# Patient Record
Sex: Female | Born: 1970 | Race: Black or African American | Hispanic: No | Marital: Single | Smoking: Never smoker
Health system: Southern US, Community
[De-identification: ages and names within clinical notes are randomized; demographics above are authoritative.]

## PROBLEM LIST (undated history)

## (undated) DIAGNOSIS — F32A Depression, unspecified: Secondary | ICD-10-CM

## (undated) DIAGNOSIS — F419 Anxiety disorder, unspecified: Secondary | ICD-10-CM

## (undated) DIAGNOSIS — I10 Essential (primary) hypertension: Secondary | ICD-10-CM

## (undated) DIAGNOSIS — E210 Primary hyperparathyroidism: Secondary | ICD-10-CM

## (undated) DIAGNOSIS — G43909 Migraine, unspecified, not intractable, without status migrainosus: Secondary | ICD-10-CM

## (undated) DIAGNOSIS — G4730 Sleep apnea, unspecified: Secondary | ICD-10-CM

## (undated) DIAGNOSIS — R12 Heartburn: Secondary | ICD-10-CM

## (undated) DIAGNOSIS — K219 Gastro-esophageal reflux disease without esophagitis: Secondary | ICD-10-CM

## (undated) DIAGNOSIS — R7303 Prediabetes: Secondary | ICD-10-CM

## (undated) DIAGNOSIS — M1990 Unspecified osteoarthritis, unspecified site: Secondary | ICD-10-CM

## (undated) DIAGNOSIS — Z87442 Personal history of urinary calculi: Secondary | ICD-10-CM

## (undated) DIAGNOSIS — E785 Hyperlipidemia, unspecified: Secondary | ICD-10-CM

## (undated) DIAGNOSIS — J302 Other seasonal allergic rhinitis: Secondary | ICD-10-CM

## (undated) DIAGNOSIS — K259 Gastric ulcer, unspecified as acute or chronic, without hemorrhage or perforation: Secondary | ICD-10-CM

## (undated) DIAGNOSIS — F329 Major depressive disorder, single episode, unspecified: Secondary | ICD-10-CM

## (undated) DIAGNOSIS — E119 Type 2 diabetes mellitus without complications: Secondary | ICD-10-CM

## (undated) DIAGNOSIS — N200 Calculus of kidney: Secondary | ICD-10-CM

## (undated) DIAGNOSIS — R112 Nausea with vomiting, unspecified: Secondary | ICD-10-CM

## (undated) DIAGNOSIS — E039 Hypothyroidism, unspecified: Secondary | ICD-10-CM

## (undated) DIAGNOSIS — J45909 Unspecified asthma, uncomplicated: Secondary | ICD-10-CM

## (undated) DIAGNOSIS — Z9889 Other specified postprocedural states: Secondary | ICD-10-CM

## (undated) HISTORY — PX: KNEE ARTHROSCOPY: SUR90

## (undated) HISTORY — PX: JOINT REPLACEMENT: SHX530

## (undated) HISTORY — DX: Gastric ulcer, unspecified as acute or chronic, without hemorrhage or perforation: K25.9

## (undated) HISTORY — PX: VAGINAL HYSTERECTOMY: SUR661

## (undated) HISTORY — DX: Hyperlipidemia, unspecified: E78.5

## (undated) HISTORY — PX: TUBAL LIGATION: SHX77

## (undated) HISTORY — PX: THYROID SURGERY: SHX805

## (undated) HISTORY — PX: BREAST BIOPSY: SHX20

## (undated) HISTORY — DX: Heartburn: R12

---

## 1997-10-31 ENCOUNTER — Other Ambulatory Visit: Admission: RE | Admit: 1997-10-31 | Discharge: 1997-10-31 | Payer: Self-pay | Admitting: Obstetrics and Gynecology

## 1998-03-09 ENCOUNTER — Inpatient Hospital Stay (HOSPITAL_COMMUNITY): Admission: AD | Admit: 1998-03-09 | Discharge: 1998-03-09 | Payer: Self-pay | Admitting: Obstetrics and Gynecology

## 1998-04-27 ENCOUNTER — Inpatient Hospital Stay (HOSPITAL_COMMUNITY): Admission: AD | Admit: 1998-04-27 | Discharge: 1998-04-27 | Payer: Self-pay | Admitting: Obstetrics and Gynecology

## 1998-05-26 ENCOUNTER — Inpatient Hospital Stay (HOSPITAL_COMMUNITY): Admission: AD | Admit: 1998-05-26 | Discharge: 1998-05-31 | Payer: Self-pay | Admitting: Obstetrics and Gynecology

## 2004-01-19 ENCOUNTER — Other Ambulatory Visit: Payer: Self-pay

## 2004-08-14 ENCOUNTER — Emergency Department: Payer: Self-pay | Admitting: Emergency Medicine

## 2005-02-18 ENCOUNTER — Emergency Department: Payer: Self-pay | Admitting: General Practice

## 2005-05-15 ENCOUNTER — Other Ambulatory Visit: Payer: Self-pay

## 2005-05-22 ENCOUNTER — Ambulatory Visit: Payer: Self-pay | Admitting: Unknown Physician Specialty

## 2005-06-05 ENCOUNTER — Emergency Department: Payer: Self-pay | Admitting: Emergency Medicine

## 2006-03-04 DIAGNOSIS — J309 Allergic rhinitis, unspecified: Secondary | ICD-10-CM | POA: Insufficient documentation

## 2006-04-08 ENCOUNTER — Emergency Department: Payer: Self-pay | Admitting: Emergency Medicine

## 2006-04-12 ENCOUNTER — Emergency Department: Payer: Self-pay | Admitting: Emergency Medicine

## 2007-06-07 ENCOUNTER — Emergency Department: Payer: Self-pay | Admitting: Emergency Medicine

## 2007-09-14 DIAGNOSIS — E039 Hypothyroidism, unspecified: Secondary | ICD-10-CM | POA: Insufficient documentation

## 2007-10-20 ENCOUNTER — Emergency Department: Payer: Self-pay | Admitting: Emergency Medicine

## 2008-03-02 ENCOUNTER — Emergency Department: Payer: Self-pay | Admitting: Emergency Medicine

## 2008-12-12 ENCOUNTER — Emergency Department: Payer: Self-pay | Admitting: Emergency Medicine

## 2009-10-01 ENCOUNTER — Emergency Department: Payer: Self-pay | Admitting: Emergency Medicine

## 2011-03-06 ENCOUNTER — Ambulatory Visit: Payer: Self-pay | Admitting: Internal Medicine

## 2011-03-27 ENCOUNTER — Ambulatory Visit: Payer: Self-pay | Admitting: Emergency Medicine

## 2011-03-29 LAB — PATHOLOGY REPORT

## 2012-07-14 ENCOUNTER — Ambulatory Visit: Payer: Self-pay | Admitting: Internal Medicine

## 2012-07-15 ENCOUNTER — Ambulatory Visit: Payer: Self-pay | Admitting: Internal Medicine

## 2012-09-22 ENCOUNTER — Ambulatory Visit: Payer: Self-pay

## 2013-06-08 ENCOUNTER — Encounter: Payer: Self-pay | Admitting: *Deleted

## 2013-06-15 ENCOUNTER — Ambulatory Visit: Payer: Self-pay | Admitting: General Surgery

## 2013-07-01 ENCOUNTER — Encounter: Payer: Self-pay | Admitting: *Deleted

## 2013-09-27 DIAGNOSIS — M1710 Unilateral primary osteoarthritis, unspecified knee: Secondary | ICD-10-CM | POA: Insufficient documentation

## 2013-09-27 DIAGNOSIS — M1712 Unilateral primary osteoarthritis, left knee: Secondary | ICD-10-CM | POA: Insufficient documentation

## 2013-09-27 DIAGNOSIS — M179 Osteoarthritis of knee, unspecified: Secondary | ICD-10-CM | POA: Insufficient documentation

## 2013-09-27 DIAGNOSIS — M25869 Other specified joint disorders, unspecified knee: Secondary | ICD-10-CM | POA: Insufficient documentation

## 2013-10-05 ENCOUNTER — Ambulatory Visit: Payer: Self-pay | Admitting: Internal Medicine

## 2013-10-14 ENCOUNTER — Ambulatory Visit: Payer: Self-pay | Admitting: General Surgery

## 2013-11-01 ENCOUNTER — Ambulatory Visit: Payer: Self-pay | Admitting: General Surgery

## 2013-11-04 ENCOUNTER — Encounter: Payer: Self-pay | Admitting: *Deleted

## 2014-01-05 ENCOUNTER — Encounter: Payer: Self-pay | Admitting: Orthopedic Surgery

## 2014-06-20 ENCOUNTER — Ambulatory Visit: Payer: Self-pay | Admitting: Internal Medicine

## 2014-12-15 ENCOUNTER — Encounter: Payer: Self-pay | Admitting: Emergency Medicine

## 2014-12-15 ENCOUNTER — Emergency Department
Admission: EM | Admit: 2014-12-15 | Discharge: 2014-12-15 | Disposition: A | Discharge status: Home / Self Care | Payer: Medicaid Other | Attending: Emergency Medicine | Admitting: Emergency Medicine

## 2014-12-15 DIAGNOSIS — R1110 Vomiting, unspecified: Secondary | ICD-10-CM | POA: Diagnosis not present

## 2014-12-15 DIAGNOSIS — R1084 Generalized abdominal pain: Secondary | ICD-10-CM | POA: Insufficient documentation

## 2014-12-15 LAB — URINALYSIS COMPLETE WITH MICROSCOPIC (ARMC ONLY)
Bacteria, UA: NONE SEEN
Bilirubin Urine: NEGATIVE
GLUCOSE, UA: NEGATIVE mg/dL
KETONES UR: NEGATIVE mg/dL
LEUKOCYTES UA: NEGATIVE
NITRITE: NEGATIVE
PH: 7 (ref 5.0–8.0)
Protein, ur: NEGATIVE mg/dL
RBC / HPF: NONE SEEN RBC/hpf (ref 0–5)
Specific Gravity, Urine: 1.001 — ABNORMAL LOW (ref 1.005–1.030)

## 2014-12-15 LAB — COMPREHENSIVE METABOLIC PANEL
ALT: 10 U/L — AB (ref 14–54)
AST: 21 U/L (ref 15–41)
Albumin: 3.7 g/dL (ref 3.5–5.0)
Alkaline Phosphatase: 55 U/L (ref 38–126)
Anion gap: 9 (ref 5–15)
BUN: 9 mg/dL (ref 6–20)
CHLORIDE: 103 mmol/L (ref 101–111)
CO2: 25 mmol/L (ref 22–32)
Calcium: 8.5 mg/dL — ABNORMAL LOW (ref 8.9–10.3)
Creatinine, Ser: 0.65 mg/dL (ref 0.44–1.00)
GFR calc Af Amer: >60 mL/min (ref >60)
GFR calc non Af Amer: >60 mL/min (ref >60)
Glucose, Bld: 107 mg/dL — ABNORMAL HIGH (ref 65–99)
Potassium: 3.8 mmol/L (ref 3.5–5.1)
SODIUM: 137 mmol/L (ref 135–145)
Total Bilirubin: 0.9 mg/dL (ref 0.3–1.2)
Total Protein: 7.8 g/dL (ref 6.5–8.1)

## 2014-12-15 LAB — CBC
HEMATOCRIT: 36.9 % (ref 35.0–47.0)
Hemoglobin: 12.2 g/dL (ref 12.0–16.0)
MCH: 28.7 pg (ref 26.0–34.0)
MCHC: 33 g/dL (ref 32.0–36.0)
MCV: 86.9 fL (ref 80.0–100.0)
Platelets: 299 10*3/uL (ref 150–440)
RBC: 4.25 MIL/uL (ref 3.80–5.20)
RDW: 14.4 % (ref 11.5–14.5)
WBC: 8.3 10*3/uL (ref 3.6–11.0)

## 2014-12-15 LAB — LIPASE, BLOOD: Lipase: 17 U/L — ABNORMAL LOW (ref 22–51)

## 2014-12-15 MED ORDER — GI COCKTAIL ~~LOC~~
30.0000 mL | ORAL | Status: AC
  Administered 2014-12-15: 30 mL via ORAL
  Filled 2014-12-15: qty 30

## 2014-12-15 MED ORDER — OXYCODONE-ACETAMINOPHEN 5-325 MG PO TABS: 0.5000 | ORAL_TABLET | Freq: Four times a day (QID) | ORAL | Status: DC | PRN

## 2014-12-15 MED ORDER — RANITIDINE HCL 150 MG PO CAPS: 150.0000 mg | ORAL_CAPSULE | Freq: Two times a day (BID) | ORAL | Status: DC

## 2014-12-15 MED ORDER — FAMOTIDINE 20 MG PO TABS
40.0000 mg | ORAL_TABLET | Freq: Once | ORAL | Status: AC
  Administered 2014-12-15: 40 mg via ORAL
  Filled 2014-12-15: qty 2

## 2014-12-15 MED ORDER — ONDANSETRON 8 MG PO TBDP: 8.0000 mg | ORAL_TABLET | Freq: Three times a day (TID) | ORAL | Status: DC | PRN

## 2014-12-15 NOTE — Discharge Instructions (Signed)
Abdominal Pain, Women °Abdominal (stomach, pelvic, or belly) pain can be caused by many things. It is important to tell your doctor: °· The location of the pain. °· Does it come and go or is it present all the time? °· Are there things that start the pain (eating certain foods, exercise)? °· Are there other symptoms associated with the pain (fever, nausea, vomiting, diarrhea)? °All of this is helpful to know when trying to find the cause of the pain. °CAUSES  °· Stomach: virus or bacteria infection, or ulcer. °· Intestine: appendicitis (inflamed appendix), regional ileitis (Crohn's disease), ulcerative colitis (inflamed colon), irritable bowel syndrome, diverticulitis (inflamed diverticulum of the colon), or cancer of the stomach or intestine. °· Gallbladder disease or stones in the gallbladder. °· Kidney disease, kidney stones, or infection. °· Pancreas infection or cancer. °· Fibromyalgia (pain disorder). °· Diseases of the female organs: °¨ Uterus: fibroid (non-cancerous) tumors or infection. °¨ Fallopian tubes: infection or tubal pregnancy. °¨ Ovary: cysts or tumors. °¨ Pelvic adhesions (scar tissue). °¨ Endometriosis (uterus lining tissue growing in the pelvis and on the pelvic organs). °¨ Pelvic congestion syndrome (female organs filling up with blood just before the menstrual period). °¨ Pain with the menstrual period. °¨ Pain with ovulation (producing an egg). °¨ Pain with an IUD (intrauterine device, birth control) in the uterus. °¨ Cancer of the female organs. °· Functional pain (pain not caused by a disease, may improve without treatment). °· Psychological pain. °· Depression. °DIAGNOSIS  °Your doctor will decide the seriousness of your pain by doing an examination. °· Blood tests. °· X-rays. °· Ultrasound. °· CT scan (computed tomography, special type of X-ray). °· MRI (magnetic resonance imaging). °· Cultures, for infection. °· Barium enema (dye inserted in the large intestine, to better view it with  X-rays). °· Colonoscopy (looking in intestine with a lighted tube). °· Laparoscopy (minor surgery, looking in abdomen with a lighted tube). °· Major abdominal exploratory surgery (looking in abdomen with a large incision). °TREATMENT  °The treatment will depend on the cause of the pain.  °· Many cases can be observed and treated at home. °· Over-the-counter medicines recommended by your caregiver. °· Prescription medicine. °· Antibiotics, for infection. °· Birth control pills, for painful periods or for ovulation pain. °· Hormone treatment, for endometriosis. °· Nerve blocking injections. °· Physical therapy. °· Antidepressants. °· Counseling with a psychologist or psychiatrist. °· Minor or major surgery. °HOME CARE INSTRUCTIONS  °· Do not take laxatives, unless directed by your caregiver. °· Take over-the-counter pain medicine only if ordered by your caregiver. Do not take aspirin because it can cause an upset stomach or bleeding. °· Try a clear liquid diet (broth or water) as ordered by your caregiver. Slowly move to a bland diet, as tolerated, if the pain is related to the stomach or intestine. °· Have a thermometer and take your temperature several times a day, and record it. °· Bed rest and sleep, if it helps the pain. °· Avoid sexual intercourse, if it causes pain. °· Avoid stressful situations. °· Keep your follow-up appointments and tests, as your caregiver orders. °· If the pain does not go away with medicine or surgery, you may try: °¨ Acupuncture. °¨ Relaxation exercises (yoga, meditation). °¨ Group therapy. °¨ Counseling. °SEEK MEDICAL CARE IF:  °· You notice certain foods cause stomach pain. °· Your home care treatment is not helping your pain. °· You need stronger pain medicine. °· You want your IUD removed. °· You feel faint or   lightheaded. °· You develop nausea and vomiting. °· You develop a rash. °· You are having side effects or an allergy to your medicine. °SEEK IMMEDIATE MEDICAL CARE IF:  °· Your  pain does not go away or gets worse. °· You have a fever. °· Your pain is felt only in portions of the abdomen. The right side could possibly be appendicitis. The left lower portion of the abdomen could be colitis or diverticulitis. °· You are passing blood in your stools (bright red or black tarry stools, with or without vomiting). °· You have blood in your urine. °· You develop chills, with or without a fever. °· You pass out. °MAKE SURE YOU:  °· Understand these instructions. °· Will watch your condition. °· Will get help right away if you are not doing well or get worse. °Document Released: 03/03/2007 Document Revised: 09/20/2013 Document Reviewed: 03/23/2009 °[employer]® Patient Information ©2015 [employer]. This information is not intended to replace advice given to you by your health care provider. Make sure you discuss any questions you have with your health care provider. ° °

## 2014-12-15 NOTE — ED Notes (Signed)
Patient ambulatory to triage with steady gait, without difficulty or distress noted; pt reports N/V with lower abd pain since 1030pm

## 2014-12-15 NOTE — ED Provider Notes (Signed)
[facility] Emergency Department Provider Note  ____________________________________________  Time seen: 7:10 AM  I have reviewed the triage vital signs and the nursing notes.   HISTORY  Chief Complaint Emesis    HPI Candice Watkins is a 44 y.o. female reports gradual onset of generalized abdominal pain around 10:00 PM last night, with onset of nausea and a few episodes of vomiting around 3:00 AM. No diarrhea, last bowel movement was at 4:30 AM today which was normal. She reports this feels like previous episodes of gastritis and peptic ulcer disease. No hematemesis melena or hematochezia. No fever chills chest pain shortness of breath dizziness syncope. No dysuria frequency urgency or hematuria. No back pain. Pain has been constant with waxing and waning course, no aggravating or alleviating factors. Associated with nausea and vomiting. Moderate intensity.    History reviewed. No pertinent past medical history.  There are no active problems to display for this patient.   Past Surgical History  Procedure Laterality Date  . Abdominal hysterectomy    . Thyroid surgery    . Knee arthroscopy      right    Current Outpatient Rx  Name  Route  Sig  Dispense  Refill  . ondansetron (ZOFRAN ODT) 8 MG disintegrating tablet   Oral   Take 1 tablet (8 mg total) by mouth every 8 (eight) hours as needed for nausea or vomiting.   20 tablet   0   . oxyCODONE-acetaminophen (ROXICET) 5-325 MG per tablet   Oral   Take 0.5 tablets by mouth every 6 (six) hours as needed for severe pain.   8 tablet   0   . ranitidine (ZANTAC) 150 MG capsule   Oral   Take 1 capsule (150 mg total) by mouth 2 (two) times daily.   28 capsule   0     Allergies Septra  No family history on file.  Social History History  Substance Use Topics  . Smoking status: Never Smoker   . Smokeless tobacco: Not on file  . Alcohol Use: No    Review of Systems  Constitutional:  No fever or chills. No weight changes Eyes:No blurry vision or double vision.  ENT: No sore throat. Cardiovascular: No chest pain. Respiratory: No dyspnea or cough. Gastrointestinal: Abdominal pain and vomiting as above.  No BRBPR or melena. Genitourinary: Negative for dysuria, urinary retention, bloody urine, or difficulty urinating. Musculoskeletal: Negative for back pain. No joint swelling or pain. Skin: Negative for rash. Neurological: Negative for headaches, focal weakness or numbness. Psychiatric:No anxiety or depression.   Endocrine:No hot/cold intolerance, changes in energy, or sleep difficulty.  10-point ROS otherwise negative.  ____________________________________________   PHYSICAL EXAM:  VITAL SIGNS: ED Triage Vitals  Enc Vitals Group     BP 12/15/14 0617 123/62 mmHg     Pulse Rate 12/15/14 0617 96     Resp 12/15/14 0617 18     Temp 12/15/14 0617 97.7 F (36.5 C)     Temp Source 12/15/14 0617 Oral     SpO2 12/15/14 0617 99 %     Weight 12/15/14 0617 248 lb (112.492 kg)     Height 12/15/14 0617 5\' 4"  (1.626 m)     Head Cir --      Peak Flow --      Pain Score 12/15/14 0618 7     Pain Loc --      Pain Edu? --      Excl. in [place]? --  Constitutional: Alert and oriented. Well appearing and in no distress. Eyes: No scleral icterus. No conjunctival pallor. PERRL. EOMI ENT   Head: Normocephalic and atraumatic.   Nose: No congestion/rhinnorhea. No septal hematoma   Mouth/Throat: MMM, no pharyngeal erythema. No peritonsillar mass. No uvula shift.   Neck: No stridor. No SubQ emphysema. No meningismus. Hematological/Lymphatic/Immunilogical: No cervical lymphadenopathy. Cardiovascular: RRR. Normal and symmetric distal pulses are present in all extremities. No murmurs, rubs, or gallops. Respiratory: Normal respiratory effort without tachypnea nor retractions. Breath sounds are clear and equal bilaterally. No wheezes/rales/rhonchi. Gastrointestinal:  Soft with mild suprapubic tenderness. No distention. There is no CVA tenderness.  No rebound, rigidity, or guarding. Genitourinary: deferred Musculoskeletal: Nontender with normal range of motion in all extremities. No joint effusions.  No lower extremity tenderness.  No edema. Neurologic:   Normal speech and language.  CN 2-10 normal. Motor grossly intact. No pronator drift.  Normal gait. No gross focal neurologic deficits are appreciated.  Skin:  Skin is warm, dry and intact. No rash noted.  No petechiae, purpura, or bullae. Psychiatric: Mood and affect are normal. Speech and behavior are normal. Patient exhibits appropriate insight and judgment.  ____________________________________________    LABS (pertinent positives/negatives) (all labs ordered are listed, but only abnormal results are displayed) Labs Reviewed  LIPASE, BLOOD - Abnormal; Notable for the following:    Lipase 17 (*)    All other components within normal limits  COMPREHENSIVE METABOLIC PANEL - Abnormal; Notable for the following:    Glucose, Bld 107 (*)    Calcium 8.5 (*)    ALT 10 (*)    All other components within normal limits  URINALYSIS COMPLETEWITH MICROSCOPIC ([facility] ONLY) - Abnormal; Notable for the following:    Color, Urine COLORLESS (*)    APPearance CLEAR (*)    Specific Gravity, Urine 1.001 (*)    Hgb urine dipstick 1+ (*)    Squamous Epithelial / LPF 0-5 (*)    All other components within normal limits  CBC   ____________________________________________   EKG    ____________________________________________    RADIOLOGY    ____________________________________________   PROCEDURES  ____________________________________________   INITIAL IMPRESSION / ASSESSMENT AND PLAN / ED COURSE  Pertinent labs & imaging results that were available during my care of the patient were reviewed by me and considered in my medical decision making (see chart for details).  Patient presents with  generalized abdominal pain that to her feels like previous episodes of peptic ulcers uterus her gastritis. On exam there is some mild suprapubic tenderness. Workup is completely unremarkable with no evidence of hematuria or urinary tract infection. White cell count normal. Differential mainly includes ovarian cyst or appendicitis, but suspicion for both of these illnesses is very low as this patient is very well-appearing not in distress. Possible there may be a small ovarian cyst this week some fluid causing some irritation, but very low suspicion for torsion, and I expect this illness to be self-limited. Discussed possible early appendicitis with the patient although I think this is unlikely that she will return if her symptoms worsen over the next few days. We'll start a trial of antacids since she is previously had these symptoms before that her not be related to peptic ulcer disease and she is not currently taking any antacids regularly.  ____________________________________________   FINAL CLINICAL IMPRESSION(S) / ED DIAGNOSES  Final diagnoses:  Generalized abdominal pain      Carrie Mew, MD 12/15/14 [phone]

## 2015-07-07 ENCOUNTER — Other Ambulatory Visit: Payer: Self-pay | Admitting: Internal Medicine

## 2015-07-07 DIAGNOSIS — N644 Mastodynia: Secondary | ICD-10-CM

## 2015-07-07 DIAGNOSIS — N63 Unspecified lump in unspecified breast: Secondary | ICD-10-CM

## 2015-07-20 ENCOUNTER — Other Ambulatory Visit: Payer: Medicaid Other

## 2015-07-20 ENCOUNTER — Ambulatory Visit: Payer: Medicaid Other | Attending: Internal Medicine

## 2015-08-18 ENCOUNTER — Ambulatory Visit
Admission: RE | Admit: 2015-08-18 | Discharge: 2015-08-18 | Disposition: A | Discharge status: Home / Self Care | Payer: Medicaid Other | Source: Ambulatory Visit | Attending: Internal Medicine | Admitting: Internal Medicine

## 2015-08-18 DIAGNOSIS — N644 Mastodynia: Secondary | ICD-10-CM | POA: Insufficient documentation

## 2015-08-18 DIAGNOSIS — N63 Unspecified lump in unspecified breast: Secondary | ICD-10-CM

## 2015-09-26 ENCOUNTER — Other Ambulatory Visit: Payer: Self-pay | Admitting: Physician Assistant

## 2015-09-26 NOTE — H&P (Signed)
TOTAL KNEE ADMISSION H&P  Patient is being admitted for right total knee arthroplasty.  Subjective:  Chief Complaint:right knee pain.  HPI: Candice Watkins, 45 y.o. female, has a history of pain and functional disability in the right knee due to arthritis and has failed non-surgical conservative treatments for greater than 12 weeks to includecorticosteriod injections.  Onset of symptoms was gradual, starting 4 years ago with rapidlly worsening course since that time. The patient noted prior procedures on the knee to include  arthroscopy and menisectomy on the right knee(s).  Patient currently rates pain in the right knee(s) at 8 out of 10 with activity. Patient has night pain, worsening of pain with activity and weight bearing, crepitus and joint swelling.  Patient has evidence of subchondral sclerosis and joint space narrowing by imaging studies. There is no active infection.  There are no active problems to display for this patient.  No past medical history on file.  Past Surgical History  Procedure Laterality Date  . Abdominal hysterectomy    . Thyroid surgery    . Knee arthroscopy      right  . Breast biopsy Bilateral     neg- core     (Not in a hospital admission) Allergies  Allergen Reactions  . Septra [Sulfamethoxazole-Trimethoprim] Nausea Only    Social History  Substance Use Topics  . Smoking status: Never Smoker   . Smokeless tobacco: Not on file  . Alcohol Use: No    Family History  Problem Relation Age of Onset  . Breast cancer Neg Hx      Review of Systems  Constitutional: Negative.   HENT: Negative.   Eyes: Negative.   Respiratory: Negative.   Cardiovascular: Negative.   Gastrointestinal: Negative.   Genitourinary: Negative.   Musculoskeletal: Positive for joint pain.  Skin: Negative.   Neurological: Negative.   Endo/Heme/Allergies: Negative.   Psychiatric/Behavioral: Negative.     Objective:  Physical Exam  Constitutional: She is oriented  to person, place, and time. She appears well-developed and well-nourished.  HENT:  Head: Normocephalic and atraumatic.  Eyes: EOM are normal. Pupils are equal, round, and reactive to light.  Neck: Normal range of motion. Neck supple.  Cardiovascular: Normal rate and regular rhythm.   Respiratory: Effort normal and breath sounds normal.  GI: Soft. Bowel sounds are normal.  Musculoskeletal:  Negative straight leg raise.  Negative log roll of both hips.  The right knee has marked varus.  Tibiofemoral translation.  Motion 0 to maybe 100.  Marked tibiofemoral crepitus.    Neurological: She is alert and oriented to person, place, and time.  Skin: Skin is warm and dry.  Psychiatric: She has a normal mood and affect. Her behavior is normal. Judgment and thought content normal.    Vital signs in last 24 hours: @VSRANGES @  Labs:   Estimated body mass index is 42.55 kg/(m^2) as calculated from the following:   Height as of 12/15/14: 5\' 4"  (1.626 m).   Weight as of 12/15/14: 112.492 kg (248 lb).   Imaging Review Plain radiographs demonstrate severe degenerative joint disease of the right knee(s). The overall alignment issignificant varus. The bone quality appears to be fair for age and reported activity level.  Assessment/Plan:  End stage arthritis, right knee   The patient history, physical examination, clinical judgment of the provider and imaging studies are consistent with end stage degenerative joint disease of the right knee(s) and total knee arthroplasty is deemed medically necessary. The treatment options including medical management,  injection therapy arthroscopy and arthroplasty were discussed at length. The risks and benefits of total knee arthroplasty were presented and reviewed. The risks due to aseptic loosening, infection, stiffness, patella tracking problems, thromboembolic complications and other imponderables were discussed. The patient acknowledged the explanation, agreed to  proceed with the plan and consent was signed. Patient is being admitted for inpatient treatment for surgery, pain control, PT, OT, prophylactic antibiotics, VTE prophylaxis, progressive ambulation and ADL's and discharge planning. The patient is planning to be discharged home with home health services

## 2015-09-27 NOTE — Pre-Procedure Instructions (Signed)
Stephana P Inthavong  09/27/2015      [id number] [address], [place] [address] Phone: [phone] Fax: [phone]    Your procedure is scheduled on May 24  Report to [facility] at 630 A.M.  Call this number if you have problems the morning of surgery:  [phone]   Remember:  Do not eat food or drink liquids after midnight.  Take these medicines the morning of surgery with A SIP OF WATER Alprazolam (Xanax), Symbicort inhaler, cetirizine (Zyrtec) if needed, levothyroxine (Synthroid), metoprolol (Lopressor), Trintellix   Stop taking aspirin, Ibuprofen, Advil, Motrin, Aleve, BC's, Goody's, herbal medications, Fish Oil   Do not wear jewelry, make-up or nail polish.  Do not wear lotions, powders, or perfumes.  You may wear deodorant.  Do not shave 48 hours prior to surgery.  Men may shave face and neck.  Do not bring valuables to the hospital.  [facility] is not responsible for any belongings or valuables.  Contacts, dentures or bridgework may not be worn into surgery.  Leave your suitcase in the car.  After surgery it may be brought to your room.  For patients admitted to the hospital, discharge time will be determined by your treatment team.  Patients discharged the day of surgery will not be allowed to drive home.    Special instructions:  [facility] - Preparing for Surgery  Before surgery, you can play an important role.  Because skin is not sterile, your skin needs to be as free of germs as possible.  You can reduce the number of germs on you skin by washing with CHG (chlorahexidine gluconate) soap before surgery.  CHG is an antiseptic cleaner which kills germs and bonds with the skin to continue killing germs even after washing.  Please DO NOT use if you have an allergy to CHG or antibacterial soaps.  If your skin becomes reddened/irritated stop using the CHG and inform your nurse when you  arrive at Short Stay.  Do not shave (including legs and underarms) for at least 48 hours prior to the first CHG shower.  You may shave your face.  Please follow these instructions carefully:   1.  Shower with CHG Soap the night before surgery and the                                morning of Surgery.  2.  If you choose to wash your hair, wash your hair first as usual with your       normal shampoo.  3.  After you shampoo, rinse your hair and body thoroughly to remove the                      Shampoo.  4.  Use CHG as you would any other liquid soap.  You can apply chg directly       to the skin and wash gently with scrungie or a clean washcloth.  5.  Apply the CHG Soap to your body ONLY FROM THE NECK DOWN.        Do not use on open wounds or open sores.  Avoid contact with your eyes,       ears, mouth and genitals (private parts).  Wash genitals (private parts)       with your normal soap.  6.  Wash thoroughly,  paying special attention to the area where your surgery        will be performed.  7.  Thoroughly rinse your body with warm water from the neck down.  8.  DO NOT shower/wash with your normal soap after using and rinsing off       the CHG Soap.  9.  Pat yourself dry with a clean towel.            10.  Wear clean pajamas.            11.  Place clean sheets on your bed the night of your first shower and do not        sleep with pets.  Day of Surgery  Do not apply any lotions/deoderants the morning of surgery.  Please wear clean clothes to the hospital/surgery center.     Please read over the following fact sheets that you were given. Pain Booklet, Coughing and Deep Breathing, Blood Transfusion Information, MRSA Information and Surgical Site Infection Prevention

## 2015-09-28 ENCOUNTER — Encounter (HOSPITAL_COMMUNITY): Payer: Self-pay

## 2015-09-28 ENCOUNTER — Encounter (HOSPITAL_COMMUNITY)
Admission: RE | Admit: 2015-09-28 | Discharge: 2015-09-28 | Disposition: A | Discharge status: Home / Self Care | Payer: Medicaid Other | Source: Ambulatory Visit | Attending: Orthopedic Surgery | Admitting: Orthopedic Surgery

## 2015-09-28 DIAGNOSIS — R000 Tachycardia, unspecified: Secondary | ICD-10-CM | POA: Diagnosis not present

## 2015-09-28 DIAGNOSIS — Z01818 Encounter for other preprocedural examination: Secondary | ICD-10-CM | POA: Insufficient documentation

## 2015-09-28 DIAGNOSIS — M1711 Unilateral primary osteoarthritis, right knee: Secondary | ICD-10-CM | POA: Diagnosis not present

## 2015-09-28 DIAGNOSIS — Z0183 Encounter for blood typing: Secondary | ICD-10-CM | POA: Diagnosis not present

## 2015-09-28 DIAGNOSIS — Z01812 Encounter for preprocedural laboratory examination: Secondary | ICD-10-CM | POA: Diagnosis not present

## 2015-09-28 HISTORY — DX: Major depressive disorder, single episode, unspecified: F32.9

## 2015-09-28 HISTORY — DX: Unspecified asthma, uncomplicated: J45.909

## 2015-09-28 HISTORY — DX: Hypothyroidism, unspecified: E03.9

## 2015-09-28 HISTORY — DX: Essential (primary) hypertension: I10

## 2015-09-28 HISTORY — DX: Depression, unspecified: F32.A

## 2015-09-28 HISTORY — DX: Anxiety disorder, unspecified: F41.9

## 2015-09-28 HISTORY — DX: Unspecified osteoarthritis, unspecified site: M19.90

## 2015-09-28 LAB — CBC
HCT: 37 % (ref 36.0–46.0)
HEMOGLOBIN: 12.1 g/dL (ref 12.0–15.0)
MCH: 28 pg (ref 26.0–34.0)
MCHC: 32.7 g/dL (ref 30.0–36.0)
MCV: 85.6 fL (ref 78.0–100.0)
PLATELETS: 299 10*3/uL (ref 150–400)
RBC: 4.32 MIL/uL (ref 3.87–5.11)
RDW: 14 % (ref 11.5–15.5)
WBC: 8 10*3/uL (ref 4.0–10.5)

## 2015-09-28 LAB — BASIC METABOLIC PANEL
ANION GAP: 12 (ref 5–15)
BUN: 6 mg/dL (ref 6–20)
CALCIUM: 9.2 mg/dL (ref 8.9–10.3)
CO2: 23 mmol/L (ref 22–32)
CREATININE: 0.72 mg/dL (ref 0.44–1.00)
Chloride: 105 mmol/L (ref 101–111)
GFR calc Af Amer: >60 mL/min (ref >60)
Glucose, Bld: 105 mg/dL — ABNORMAL HIGH (ref 65–99)
Potassium: 3.8 mmol/L (ref 3.5–5.1)
SODIUM: 140 mmol/L (ref 135–145)

## 2015-09-28 LAB — TYPE AND SCREEN
ABO/RH(D): O POS
ANTIBODY SCREEN: NEGATIVE

## 2015-09-28 LAB — ABO/RH: ABO/RH(D): O POS

## 2015-09-28 LAB — SURGICAL PCR SCREEN
MRSA, PCR: NEGATIVE
STAPHYLOCOCCUS AUREUS: NEGATIVE

## 2015-09-28 NOTE — Progress Notes (Signed)
PCP is Dr Lamonte Sakai Cardiologist is Dr Humphrey Rolls Stress test noted in care everywhere from 2010 Pt states she had a heart cath at [facility] -request sent  Clearance noted on the chart.- Medical and cardiac

## 2015-09-29 NOTE — Progress Notes (Signed)
Anesthesia Chart Review:  Pt is a 45 year old female scheduled for R total knee arthroplasty on 10/11/2015 with Dr. Maryla Morrow.   PCP is Dr. Lamonte Sakai; pt has clearance from cardiac and medical standpoint from Dr. Laurelyn Sickle partner Dr. Olivia Mackie McLean-Scocuzza.   PMH includes:  HTN, asthma, hypothyroidism. Never smoker. BMI 44  Medications include: symbicort, levothyroxine, lisinpril-hctz, metoprolol, simvastatin.   Preoperative labs reviewed.    EKG 09/28/15: Sinus tachycardia (108 bpm). Nonspecific T wave abnormality  Cardiac cath 01/24/2009 (done at [facility] for abnormal stress test):  - No evidence CAD - Low left sided filling pressures (LVEDP = 5 mmHg) - Normal appearing thoracoabdominal aorta  Echo 01/19/09:  - Normal LV EF 60-65% - Mild TR - Mild PR - Normal RV contractile performance  If no changes, I anticipate pt can proceed with surgery as scheduled.   Willeen Cass, FNP-BC [facility] Short Stay Surgical Center/Anesthesiology Phone: [phone] 09/29/2015 3:43 PM

## 2015-10-10 MED ORDER — DEXTROSE 5 % IV SOLN
3.0000 g | INTRAVENOUS | Status: AC
  Administered 2015-10-11: 3 g via INTRAVENOUS
  Filled 2015-10-10: qty 3000

## 2015-10-10 MED ORDER — TRANEXAMIC ACID 1000 MG/10ML IV SOLN
1000.0000 mg | INTRAVENOUS | Status: AC
  Administered 2015-10-11: 1000 mg via INTRAVENOUS
  Filled 2015-10-10: qty 10

## 2015-10-10 MED ORDER — LACTATED RINGERS IV SOLN
INTRAVENOUS | Status: DC
  Administered 2015-10-11 (×2): via INTRAVENOUS

## 2015-10-10 NOTE — Anesthesia Preprocedure Evaluation (Addendum)
Anesthesia Evaluation  Patient identified by MRN, date of birth, ID band Patient awake    Reviewed: Allergy & Precautions, H&P , NPO status , Patient's Chart, lab work & pertinent test results  Airway Mallampati: II  TM Distance: >3 FB Neck ROM: full    Dental no notable dental hx. (+) Dental Advisory Given, Teeth Intact   Pulmonary neg pulmonary ROS, asthma ,    Pulmonary exam normal breath sounds clear to auscultation       Cardiovascular hypertension, Pt. on home beta blockers and Pt. on medications Normal cardiovascular exam Rhythm:regular Rate:Normal     Neuro/Psych negative neurological ROS  negative psych ROS   GI/Hepatic negative GI ROS, Neg liver ROS,   Endo/Other  negative endocrine ROSHypothyroidism Morbid obesity  Renal/GU negative Renal ROS  negative genitourinary   Musculoskeletal   Abdominal (+) + obese,   Peds  Hematology negative hematology ROS (+)   Anesthesia Other Findings   Reproductive/Obstetrics negative OB ROS                            Anesthesia Physical Anesthesia Plan  ASA: III  Anesthesia Plan: General   Post-op Pain Management:    Induction: Intravenous  Airway Management Planned: Oral ETT  Additional Equipment:   Intra-op Plan:   Post-operative Plan: Extubation in OR  Informed Consent: I have reviewed the patients History and Physical, chart, labs and discussed the procedure including the risks, benefits and alternatives for the proposed anesthesia with the patient or authorized representative who has indicated his/her understanding and acceptance.   Dental Advisory Given  Plan Discussed with: CRNA and Surgeon  Anesthesia Plan Comments:         Anesthesia Quick Evaluation

## 2015-10-11 ENCOUNTER — Inpatient Hospital Stay (HOSPITAL_COMMUNITY): Payer: Medicaid Other | Admitting: Emergency Medicine

## 2015-10-11 ENCOUNTER — Inpatient Hospital Stay (HOSPITAL_COMMUNITY): Payer: Medicaid Other | Admitting: Anesthesiology

## 2015-10-11 ENCOUNTER — Encounter (HOSPITAL_COMMUNITY): Payer: Self-pay | Admitting: Certified Registered Nurse Anesthetist

## 2015-10-11 ENCOUNTER — Inpatient Hospital Stay (HOSPITAL_COMMUNITY)
Admission: RE | Admit: 2015-10-11 | Discharge: 2015-10-15 | DRG: 470 | Disposition: A | Discharge status: Home Health | Payer: Medicaid Other | Source: Ambulatory Visit | Attending: Orthopedic Surgery | Admitting: Orthopedic Surgery

## 2015-10-11 ENCOUNTER — Inpatient Hospital Stay (HOSPITAL_COMMUNITY): Payer: Medicaid Other

## 2015-10-11 ENCOUNTER — Encounter (HOSPITAL_COMMUNITY)
Admission: RE | Disposition: A | Discharge status: Home Health | Payer: Self-pay | Source: Ambulatory Visit | Attending: Orthopedic Surgery

## 2015-10-11 DIAGNOSIS — M1711 Unilateral primary osteoarthritis, right knee: Principal | ICD-10-CM | POA: Diagnosis present

## 2015-10-11 DIAGNOSIS — Z6841 Body Mass Index (BMI) 40.0 and over, adult: Secondary | ICD-10-CM

## 2015-10-11 DIAGNOSIS — D62 Acute posthemorrhagic anemia: Secondary | ICD-10-CM | POA: Diagnosis not present

## 2015-10-11 DIAGNOSIS — I10 Essential (primary) hypertension: Secondary | ICD-10-CM | POA: Diagnosis present

## 2015-10-11 DIAGNOSIS — I959 Hypotension, unspecified: Secondary | ICD-10-CM | POA: Diagnosis present

## 2015-10-11 DIAGNOSIS — M25561 Pain in right knee: Secondary | ICD-10-CM | POA: Diagnosis present

## 2015-10-11 DIAGNOSIS — F329 Major depressive disorder, single episode, unspecified: Secondary | ICD-10-CM | POA: Diagnosis present

## 2015-10-11 DIAGNOSIS — Z888 Allergy status to other drugs, medicaments and biological substances status: Secondary | ICD-10-CM | POA: Diagnosis not present

## 2015-10-11 DIAGNOSIS — Z96659 Presence of unspecified artificial knee joint: Secondary | ICD-10-CM

## 2015-10-11 DIAGNOSIS — E039 Hypothyroidism, unspecified: Secondary | ICD-10-CM | POA: Diagnosis present

## 2015-10-11 DIAGNOSIS — F419 Anxiety disorder, unspecified: Secondary | ICD-10-CM | POA: Diagnosis present

## 2015-10-11 HISTORY — DX: Other specified postprocedural states: Z98.890

## 2015-10-11 HISTORY — DX: Calculus of kidney: N20.0

## 2015-10-11 HISTORY — PX: TOTAL KNEE ARTHROPLASTY: SHX125

## 2015-10-11 HISTORY — DX: Nausea with vomiting, unspecified: R11.2

## 2015-10-11 HISTORY — DX: Migraine, unspecified, not intractable, without status migrainosus: G43.909

## 2015-10-11 SURGERY — ARTHROPLASTY, KNEE, TOTAL
Anesthesia: General | Laterality: Right

## 2015-10-11 MED ORDER — CHLORHEXIDINE GLUCONATE 4 % EX LIQD: 60.0000 mL | Freq: Once | CUTANEOUS | Status: DC

## 2015-10-11 MED ORDER — PROPOFOL 1000 MG/100ML IV EMUL
INTRAVENOUS | Status: AC
  Filled 2015-10-11: qty 100

## 2015-10-11 MED ORDER — GLYCOPYRROLATE 0.2 MG/ML IJ SOLN
INTRAMUSCULAR | Status: DC | PRN
  Administered 2015-10-11: 0.1 mg via INTRAVENOUS

## 2015-10-11 MED ORDER — SENNA 8.6 MG PO TABS
1.0000 | ORAL_TABLET | Freq: Two times a day (BID) | ORAL | Status: DC
  Administered 2015-10-11 – 2015-10-15 (×8): 8.6 mg via ORAL
  Filled 2015-10-11 (×8): qty 1

## 2015-10-11 MED ORDER — FLUTICASONE FUROATE-VILANTEROL 100-25 MCG/INH IN AEPB
1.0000 | INHALATION_SPRAY | Freq: Every day | RESPIRATORY_TRACT | Status: DC
  Filled 2015-10-11: qty 28

## 2015-10-11 MED ORDER — LISINOPRIL 10 MG PO TABS
10.0000 mg | ORAL_TABLET | Freq: Every morning | ORAL | Status: DC
  Administered 2015-10-12 – 2015-10-15 (×4): 10 mg via ORAL
  Filled 2015-10-11 (×4): qty 1

## 2015-10-11 MED ORDER — METOPROLOL TARTRATE 50 MG PO TABS
50.0000 mg | ORAL_TABLET | Freq: Every day | ORAL | Status: DC
  Administered 2015-10-12 – 2015-10-14 (×3): 50 mg via ORAL
  Filled 2015-10-11 (×4): qty 1

## 2015-10-11 MED ORDER — HYDROMORPHONE HCL 1 MG/ML IJ SOLN
INTRAMUSCULAR | Status: AC
  Administered 2015-10-11: 0.5 mg via INTRAVENOUS
  Filled 2015-10-11: qty 2

## 2015-10-11 MED ORDER — ACETAMINOPHEN 650 MG RE SUPP: 650.0000 mg | Freq: Four times a day (QID) | RECTAL | Status: DC | PRN

## 2015-10-11 MED ORDER — HYDROMORPHONE HCL 1 MG/ML IJ SOLN
0.2500 mg | INTRAMUSCULAR | Status: DC | PRN
  Administered 2015-10-11 (×4): 0.5 mg via INTRAVENOUS

## 2015-10-11 MED ORDER — OXYCODONE-ACETAMINOPHEN 5-325 MG PO TABS: 1.0000 | ORAL_TABLET | ORAL | Status: DC | PRN

## 2015-10-11 MED ORDER — ONDANSETRON HCL 4 MG/2ML IJ SOLN
INTRAMUSCULAR | Status: AC
  Filled 2015-10-11: qty 2

## 2015-10-11 MED ORDER — LISINOPRIL-HYDROCHLOROTHIAZIDE 10-12.5 MG PO TABS: 1.0000 | ORAL_TABLET | Freq: Every day | ORAL | Status: DC

## 2015-10-11 MED ORDER — HYDROMORPHONE HCL 1 MG/ML IJ SOLN
INTRAMUSCULAR | Status: AC
  Administered 2015-10-11: 0.5 mg via INTRAVENOUS
  Filled 2015-10-11: qty 1

## 2015-10-11 MED ORDER — DIPHENHYDRAMINE HCL 12.5 MG/5ML PO ELIX: 12.5000 mg | ORAL_SOLUTION | ORAL | Status: DC | PRN

## 2015-10-11 MED ORDER — BUPIVACAINE HCL (PF) 0.5 % IJ SOLN
INTRAMUSCULAR | Status: AC
  Filled 2015-10-11: qty 30

## 2015-10-11 MED ORDER — ACETAMINOPHEN 325 MG PO TABS: 650.0000 mg | ORAL_TABLET | Freq: Four times a day (QID) | ORAL | Status: DC | PRN

## 2015-10-11 MED ORDER — OXYCODONE HCL 5 MG PO TABS
5.0000 mg | ORAL_TABLET | ORAL | Status: DC | PRN
  Administered 2015-10-11 – 2015-10-15 (×19): 10 mg via ORAL
  Filled 2015-10-11 (×18): qty 2

## 2015-10-11 MED ORDER — ONDANSETRON HCL 4 MG PO TABS: 4.0000 mg | ORAL_TABLET | Freq: Four times a day (QID) | ORAL | Status: DC | PRN

## 2015-10-11 MED ORDER — FLUTICASONE FUROATE-VILANTEROL 100-25 MCG/INH IN AEPB
1.0000 | INHALATION_SPRAY | Freq: Every day | RESPIRATORY_TRACT | Status: DC
  Administered 2015-10-13 – 2015-10-14 (×2): 1 via RESPIRATORY_TRACT

## 2015-10-11 MED ORDER — HYDROCHLOROTHIAZIDE 12.5 MG PO CAPS
12.5000 mg | ORAL_CAPSULE | Freq: Every day | ORAL | Status: DC
  Administered 2015-10-12 – 2015-10-15 (×4): 12.5 mg via ORAL
  Filled 2015-10-11 (×4): qty 1

## 2015-10-11 MED ORDER — ONDANSETRON HCL 4 MG/2ML IJ SOLN
4.0000 mg | Freq: Four times a day (QID) | INTRAMUSCULAR | Status: DC | PRN
  Administered 2015-10-11: 4 mg via INTRAVENOUS
  Filled 2015-10-11: qty 2

## 2015-10-11 MED ORDER — LACTATED RINGERS IV SOLN: INTRAVENOUS | Status: DC

## 2015-10-11 MED ORDER — ONDANSETRON HCL 4 MG/2ML IJ SOLN
INTRAMUSCULAR | Status: DC | PRN
  Administered 2015-10-11: 4 mg via INTRAVENOUS

## 2015-10-11 MED ORDER — ARTIFICIAL TEARS OP OINT
TOPICAL_OINTMENT | OPHTHALMIC | Status: DC | PRN
  Administered 2015-10-11: 1 via OPHTHALMIC

## 2015-10-11 MED ORDER — SODIUM CHLORIDE 0.9 % IR SOLN
Status: DC | PRN
  Administered 2015-10-11: 1000 mL
  Administered 2015-10-11: 3000 mL

## 2015-10-11 MED ORDER — GLYCOPYRROLATE 0.2 MG/ML IV SOSY
PREFILLED_SYRINGE | INTRAVENOUS | Status: AC
  Filled 2015-10-11: qty 3

## 2015-10-11 MED ORDER — APIXABAN 2.5 MG PO TABS
2.5000 mg | ORAL_TABLET | Freq: Two times a day (BID) | ORAL | Status: DC
  Administered 2015-10-12 – 2015-10-15 (×7): 2.5 mg via ORAL
  Filled 2015-10-11 (×7): qty 1

## 2015-10-11 MED ORDER — METOPROLOL TARTRATE 50 MG PO TABS
50.0000 mg | ORAL_TABLET | Freq: Every day | ORAL | Status: DC
  Filled 2015-10-11: qty 1

## 2015-10-11 MED ORDER — ONDANSETRON HCL 4 MG PO TABS: 4.0000 mg | ORAL_TABLET | Freq: Three times a day (TID) | ORAL | Status: DC | PRN

## 2015-10-11 MED ORDER — HYDROMORPHONE HCL 1 MG/ML IJ SOLN
0.5000 mg | INTRAMUSCULAR | Status: DC | PRN
  Administered 2015-10-11 – 2015-10-15 (×13): 1 mg via INTRAVENOUS
  Filled 2015-10-11 (×13): qty 1

## 2015-10-11 MED ORDER — SUCCINYLCHOLINE CHLORIDE 20 MG/ML IJ SOLN
INTRAMUSCULAR | Status: DC | PRN
  Administered 2015-10-11: 160 mg via INTRAVENOUS

## 2015-10-11 MED ORDER — METOCLOPRAMIDE HCL 5 MG PO TABS: 5.0000 mg | ORAL_TABLET | Freq: Three times a day (TID) | ORAL | Status: DC | PRN

## 2015-10-11 MED ORDER — PROPOFOL 500 MG/50ML IV EMUL
INTRAVENOUS | Status: DC | PRN
  Administered 2015-10-11: 10 ug/kg/min via INTRAVENOUS

## 2015-10-11 MED ORDER — CEFAZOLIN SODIUM-DEXTROSE 2-4 GM/100ML-% IV SOLN
2.0000 g | Freq: Four times a day (QID) | INTRAVENOUS | Status: AC
  Administered 2015-10-11 (×2): 2 g via INTRAVENOUS
  Filled 2015-10-11 (×3): qty 100

## 2015-10-11 MED ORDER — METHOCARBAMOL 500 MG PO TABS: 500.0000 mg | ORAL_TABLET | Freq: Four times a day (QID) | ORAL | Status: DC

## 2015-10-11 MED ORDER — METHOCARBAMOL 1000 MG/10ML IJ SOLN
500.0000 mg | Freq: Four times a day (QID) | INTRAVENOUS | Status: DC | PRN
  Administered 2015-10-11: 500 mg via INTRAVENOUS
  Filled 2015-10-11 (×2): qty 5

## 2015-10-11 MED ORDER — MIDAZOLAM HCL 2 MG/2ML IJ SOLN
INTRAMUSCULAR | Status: DC | PRN
  Administered 2015-10-11 (×2): 1 mg via INTRAVENOUS

## 2015-10-11 MED ORDER — APIXABAN 2.5 MG PO TABS: ORAL_TABLET | ORAL | Status: DC

## 2015-10-11 MED ORDER — BUPIVACAINE HCL 0.5 % IJ SOLN
INTRAMUSCULAR | Status: DC | PRN
  Administered 2015-10-11: 10 mL

## 2015-10-11 MED ORDER — BISACODYL 10 MG RE SUPP: 10.0000 mg | Freq: Every day | RECTAL | Status: DC | PRN

## 2015-10-11 MED ORDER — ALBUTEROL SULFATE HFA 108 (90 BASE) MCG/ACT IN AERS
INHALATION_SPRAY | RESPIRATORY_TRACT | Status: AC
  Filled 2015-10-11: qty 6.7

## 2015-10-11 MED ORDER — OXYCODONE HCL 5 MG PO TABS
ORAL_TABLET | ORAL | Status: AC
  Administered 2015-10-11: 10 mg via ORAL
  Filled 2015-10-11: qty 2

## 2015-10-11 MED ORDER — METHOCARBAMOL 500 MG PO TABS
500.0000 mg | ORAL_TABLET | Freq: Four times a day (QID) | ORAL | Status: DC | PRN
  Administered 2015-10-11 – 2015-10-15 (×8): 500 mg via ORAL
  Filled 2015-10-11 (×9): qty 1

## 2015-10-11 MED ORDER — MAGNESIUM CITRATE PO SOLN: 1.0000 | Freq: Once | ORAL | Status: DC | PRN

## 2015-10-11 MED ORDER — SENNOSIDES-DOCUSATE SODIUM 8.6-50 MG PO TABS
1.0000 | ORAL_TABLET | Freq: Every evening | ORAL | Status: DC | PRN
Start: 2015-10-11 — End: 2015-10-15

## 2015-10-11 MED ORDER — POTASSIUM CHLORIDE IN NACL 20-0.9 MEQ/L-% IV SOLN
INTRAVENOUS | Status: DC
  Administered 2015-10-11: 15:00:00 via INTRAVENOUS
  Filled 2015-10-11: qty 1000

## 2015-10-11 MED ORDER — SODIUM CHLORIDE 0.9 % IV SOLN
INTRAVENOUS | Status: DC | PRN
  Administered 2015-10-11: 60 mL

## 2015-10-11 MED ORDER — PROPOFOL 10 MG/ML IV BOLUS
INTRAVENOUS | Status: DC | PRN
  Administered 2015-10-11: 200 mg via INTRAVENOUS

## 2015-10-11 MED ORDER — SUCCINYLCHOLINE CHLORIDE 200 MG/10ML IV SOSY
PREFILLED_SYRINGE | INTRAVENOUS | Status: AC
  Filled 2015-10-11: qty 10

## 2015-10-11 MED ORDER — ARTIFICIAL TEARS OP OINT
TOPICAL_OINTMENT | OPHTHALMIC | Status: AC
  Filled 2015-10-11: qty 3.5

## 2015-10-11 MED ORDER — FENTANYL CITRATE (PF) 250 MCG/5ML IJ SOLN
INTRAMUSCULAR | Status: DC | PRN
  Administered 2015-10-11 (×2): 50 ug via INTRAVENOUS
  Administered 2015-10-11 (×2): 100 ug via INTRAVENOUS

## 2015-10-11 MED ORDER — FENTANYL CITRATE (PF) 250 MCG/5ML IJ SOLN
INTRAMUSCULAR | Status: AC
  Filled 2015-10-11: qty 5

## 2015-10-11 MED ORDER — BUPIVACAINE LIPOSOME 1.3 % IJ SUSP
20.0000 mL | INTRAMUSCULAR | Status: DC
  Filled 2015-10-11: qty 20

## 2015-10-11 MED ORDER — LEVOTHYROXINE SODIUM 100 MCG PO TABS
100.0000 ug | ORAL_TABLET | Freq: Every day | ORAL | Status: DC
  Administered 2015-10-12 – 2015-10-15 (×4): 100 ug via ORAL
  Filled 2015-10-11 (×4): qty 1

## 2015-10-11 MED ORDER — PHENYLEPHRINE HCL 10 MG/ML IJ SOLN
10.0000 mg | INTRAVENOUS | Status: DC | PRN
  Administered 2015-10-11: 100 ug/min via INTRAVENOUS

## 2015-10-11 MED ORDER — ALPRAZOLAM 0.5 MG PO TABS
1.0000 mg | ORAL_TABLET | Freq: Every day | ORAL | Status: DC
  Administered 2015-10-11 – 2015-10-15 (×4): 1 mg via ORAL
  Filled 2015-10-11 (×5): qty 2

## 2015-10-11 MED ORDER — ZOLPIDEM TARTRATE 5 MG PO TABS
5.0000 mg | ORAL_TABLET | Freq: Every evening | ORAL | Status: DC | PRN
  Administered 2015-10-11: 5 mg via ORAL
  Filled 2015-10-11: qty 1

## 2015-10-11 MED ORDER — HYDROMORPHONE HCL 1 MG/ML IJ SOLN
0.5000 mg | INTRAMUSCULAR | Status: AC | PRN
  Administered 2015-10-11 (×4): 0.5 mg via INTRAVENOUS

## 2015-10-11 MED ORDER — MIDAZOLAM HCL 2 MG/2ML IJ SOLN
INTRAMUSCULAR | Status: AC
  Filled 2015-10-11: qty 2

## 2015-10-11 MED ORDER — LIDOCAINE HCL (CARDIAC) 20 MG/ML IV SOLN
INTRAVENOUS | Status: DC | PRN
  Administered 2015-10-11: 80 mg via INTRATRACHEAL

## 2015-10-11 MED ORDER — SIMVASTATIN 20 MG PO TABS
20.0000 mg | ORAL_TABLET | Freq: Every day | ORAL | Status: DC
  Administered 2015-10-11 – 2015-10-14 (×4): 20 mg via ORAL
  Filled 2015-10-11 (×5): qty 1

## 2015-10-11 MED ORDER — LIDOCAINE 2% (20 MG/ML) 5 ML SYRINGE
INTRAMUSCULAR | Status: AC
  Filled 2015-10-11: qty 5

## 2015-10-11 MED ORDER — VORTIOXETINE HBR 20 MG PO TABS
10.0000 mg | ORAL_TABLET | Freq: Every day | ORAL | Status: DC
  Administered 2015-10-12 – 2015-10-15 (×4): 10 mg via ORAL
  Filled 2015-10-11 (×5): qty 10

## 2015-10-11 MED ORDER — METOCLOPRAMIDE HCL 5 MG/ML IJ SOLN: 5.0000 mg | Freq: Three times a day (TID) | INTRAMUSCULAR | Status: DC | PRN

## 2015-10-11 SURGICAL SUPPLY — 70 items
BANDAGE ACE 4X5 VEL STRL LF (GAUZE/BANDAGES/DRESSINGS) ×3 IMPLANT
BANDAGE ACE 6X5 VEL STRL LF (GAUZE/BANDAGES/DRESSINGS) ×3 IMPLANT
BANDAGE ELASTIC 4 VELCRO ST LF (GAUZE/BANDAGES/DRESSINGS) ×3 IMPLANT
BANDAGE ELASTIC 6 VELCRO ST LF (GAUZE/BANDAGES/DRESSINGS) ×3 IMPLANT
BANDAGE ESMARK 6X9 LF (GAUZE/BANDAGES/DRESSINGS) ×1 IMPLANT
BENZOIN TINCTURE PRP APPL 2/3 (GAUZE/BANDAGES/DRESSINGS) ×3 IMPLANT
BLADE SAG 18X100X1.27 (BLADE) ×6 IMPLANT
BNDG ESMARK 6X9 LF (GAUZE/BANDAGES/DRESSINGS) ×3
BOWL SMART MIX CTS (DISPOSABLE) ×3 IMPLANT
CAPT KNEE TOTAL 3 ×3 IMPLANT
CEMENT BONE SIMPLEX SPEEDSET (Cement) ×6 IMPLANT
CLOSURE STERI-STRIP 1/2X4 (GAUZE/BANDAGES/DRESSINGS) ×1
CLOSURE WOUND 1/2 X4 (GAUZE/BANDAGES/DRESSINGS) ×2
CLSR STERI-STRIP ANTIMIC 1/2X4 (GAUZE/BANDAGES/DRESSINGS) ×2 IMPLANT
COVER SURGICAL LIGHT HANDLE (MISCELLANEOUS) ×3 IMPLANT
CUFF TOURNIQUET SINGLE 34IN LL (TOURNIQUET CUFF) ×3 IMPLANT
DRAPE EXTREMITY T 121X128X90 (DRAPE) ×3 IMPLANT
DRAPE IMP U-DRAPE 54X76 (DRAPES) ×3 IMPLANT
DRAPE PROXIMA HALF (DRAPES) ×3 IMPLANT
DRAPE U-SHAPE 47X51 STRL (DRAPES) ×3 IMPLANT
DRSG PAD ABDOMINAL 8X10 ST (GAUZE/BANDAGES/DRESSINGS) ×3 IMPLANT
DURAPREP 26ML APPLICATOR (WOUND CARE) ×6 IMPLANT
ELECT CAUTERY BLADE 6.4 (BLADE) ×3 IMPLANT
ELECT REM PT RETURN 9FT ADLT (ELECTROSURGICAL) ×3
ELECTRODE REM PT RTRN 9FT ADLT (ELECTROSURGICAL) ×1 IMPLANT
EVACUATOR 1/8 PVC DRAIN (DRAIN) ×3 IMPLANT
FACESHIELD WRAPAROUND (MASK) ×6 IMPLANT
GAUZE SPONGE 4X4 12PLY STRL (GAUZE/BANDAGES/DRESSINGS) ×3 IMPLANT
GLOVE BIOGEL PI IND STRL 7.0 (GLOVE) ×1 IMPLANT
GLOVE BIOGEL PI INDICATOR 7.0 (GLOVE) ×2
GLOVE ORTHO TXT STRL SZ7.5 (GLOVE) ×3 IMPLANT
GLOVE SURG ORTHO 7.0 STRL STRW (GLOVE) ×3 IMPLANT
GOWN STRL REUS W/ TWL LRG LVL3 (GOWN DISPOSABLE) ×2 IMPLANT
GOWN STRL REUS W/ TWL XL LVL3 (GOWN DISPOSABLE) ×1 IMPLANT
GOWN STRL REUS W/TWL LRG LVL3 (GOWN DISPOSABLE) ×4
GOWN STRL REUS W/TWL XL LVL3 (GOWN DISPOSABLE) ×2
HANDPIECE INTERPULSE COAX TIP (DISPOSABLE) ×2
IMMOBILIZER KNEE 22 UNIV (SOFTGOODS) ×3 IMPLANT
IMMOBILIZER KNEE 24 THIGH 36 (MISCELLANEOUS) IMPLANT
IMMOBILIZER KNEE 24 UNIV (MISCELLANEOUS)
KIT BASIN OR (CUSTOM PROCEDURE TRAY) ×3 IMPLANT
KIT ROOM TURNOVER OR (KITS) ×3 IMPLANT
MANIFOLD NEPTUNE II (INSTRUMENTS) ×3 IMPLANT
NEEDLE 18GX1X1/2 (RX/OR ONLY) (NEEDLE) ×3 IMPLANT
NEEDLE HYPO 25GX1X1/2 BEV (NEEDLE) ×3 IMPLANT
NS IRRIG 1000ML POUR BTL (IV SOLUTION) ×3 IMPLANT
PACK TOTAL JOINT (CUSTOM PROCEDURE TRAY) ×3 IMPLANT
PACK UNIVERSAL I (CUSTOM PROCEDURE TRAY) ×3 IMPLANT
PAD ARMBOARD 7.5X6 YLW CONV (MISCELLANEOUS) ×6 IMPLANT
PAD CAST 4YDX4 CTTN HI CHSV (CAST SUPPLIES) ×1 IMPLANT
PADDING CAST COTTON 4X4 STRL (CAST SUPPLIES) ×2
PADDING CAST COTTON 6X4 STRL (CAST SUPPLIES) ×3 IMPLANT
SET HNDPC FAN SPRY TIP SCT (DISPOSABLE) ×1 IMPLANT
SPONGE GAUZE 4X4 12PLY STER LF (GAUZE/BANDAGES/DRESSINGS) ×3 IMPLANT
STRIP CLOSURE SKIN 1/2X4 (GAUZE/BANDAGES/DRESSINGS) ×4 IMPLANT
SUCTION FRAZIER HANDLE 10FR (MISCELLANEOUS) ×2
SUCTION TUBE FRAZIER 10FR DISP (MISCELLANEOUS) ×1 IMPLANT
SUT MNCRL AB 4-0 PS2 18 (SUTURE) ×3 IMPLANT
SUT VIC AB 0 CT1 27 (SUTURE)
SUT VIC AB 0 CT1 27XBRD ANBCTR (SUTURE) IMPLANT
SUT VIC AB 1 CTX 36 (SUTURE) ×2
SUT VIC AB 1 CTX36XBRD ANBCTR (SUTURE) ×1 IMPLANT
SUT VIC AB 2-0 CT1 27 (SUTURE) ×4
SUT VIC AB 2-0 CT1 TAPERPNT 27 (SUTURE) ×2 IMPLANT
SYR 50ML LL SCALE MARK (SYRINGE) ×3 IMPLANT
SYR CONTROL 10ML LL (SYRINGE) ×3 IMPLANT
TOWEL OR 17X24 6PK STRL BLUE (TOWEL DISPOSABLE) ×3 IMPLANT
TOWEL OR 17X26 10 PK STRL BLUE (TOWEL DISPOSABLE) ×3 IMPLANT
WATER STERILE IRR 1000ML POUR (IV SOLUTION) ×3 IMPLANT
YANKAUER SUCT BULB TIP NO VENT (SUCTIONS) ×3 IMPLANT

## 2015-10-11 NOTE — Evaluation (Signed)
Physical Therapy Evaluation Patient Details Name: Candice Watkins MRN: [id number] DOB: Jan 03, 1971 Today's Date: 10/11/2015   History of Present Illness  Pt presents for right TKA. PMH: PONV, HTN, asthma, hypothyroid  Clinical Impression  Pt is s/p TKA resulting in the deficits listed below (see PT Problem List). Pt transferred bed to [facility] and BSC to recliner with min A and RW, further mobility limited by nausea.  Pt will benefit from skilled PT to increase their independence and safety with mobility to allow discharge to the venue listed below.      Follow Up Recommendations Home health PT;Supervision for mobility/OOB    Equipment Recommendations  Rolling walker with 5" wheels;3in1 (PT)    Recommendations for Other Services OT consult     Precautions / Restrictions Precautions Precautions: Knee Precaution Booklet Issued: No Precaution Comments: reviewed proper positioning as well as use of CPM and zero knee foam Restrictions Weight Bearing Restrictions: Yes RLE Weight Bearing: Weight bearing as tolerated      Mobility  Bed Mobility Overal bed mobility: Needs Assistance Bed Mobility: Supine to Sit     Supine to sit: Min assist     General bed mobility comments: vc's for sequencing and min A to RLE for off bed.   Transfers Overall transfer level: Needs assistance Equipment used: Rolling walker (2 wheeled) Transfers: Sit to/from [facility] Sit to Stand: Min assist Stand pivot transfers: Min assist       General transfer comment: vc's for hand placement. Min A to steady during sit to stand and SPT bed to [facility] and BSC to recliner. vc's for sequencing  Ambulation/Gait             General Gait Details: NT due to pt lethargy and nausea (RN present and giving meds)  Stairs            Wheelchair Mobility    Modified Rankin (Stroke Patients Only)       Balance Overall balance assessment: Needs assistance;History of  Falls Sitting-balance support: No upper extremity supported Sitting balance-Leahy Scale: Good     Standing balance support: Bilateral upper extremity supported Standing balance-Leahy Scale: Poor Standing balance comment: heavy reliance on RW                             Pertinent Vitals/Pain Pain Assessment: Faces Faces Pain Scale: Hurts even more Pain Location: right knee Pain Descriptors / Indicators: Sore Pain Intervention(s): Limited activity within patient's tolerance;Monitored during session;Premedicated before session;Repositioned    Home Living Family/patient expects to be discharged to:: Private residence Living Arrangements: Children Available Help at Discharge: Family;Available PRN/intermittently Type of Home: House Home Access: Level entry     Home Layout: Two level;1/2 bath on main level Home Equipment: None Additional Comments: pt's 61 yo daughter lives with her and can help when not in school. Pt also has good extended family support    Prior Function Level of Independence: Needs assistance   Gait / Transfers Assistance Needed: pt reports that she fell several times before surgery due to her right knee giving way  ADL's / Homemaking Assistance Needed: daughter sometimes needed to help her with bathing and dressing        Hand Dominance        Extremity/Trunk Assessment   Upper Extremity Assessment: Overall WFL for tasks assessed           Lower Extremity Assessment: RLE deficits/detail RLE Deficits / Details:  quad 2/5, hip flex 2/5, ankle WFL    Cervical / Trunk Assessment: Normal  Communication   Communication: No difficulties  Cognition Arousal/Alertness: Lethargic;Suspect due to medications Behavior During Therapy: [facility] for tasks assessed/performed Overall Cognitive Status: Within Functional Limits for tasks assessed                      General Comments      Exercises Total Joint Exercises Ankle Circles/Pumps:  AROM;Both;20 reps;Supine;Seated Quad Sets: AROM;Both;10 reps;Seated Goniometric ROM: 5-75      Assessment/Plan    PT Assessment Patient needs continued PT services  PT Diagnosis Difficulty walking;Abnormality of gait;Acute pain   PT Problem List Decreased strength;Decreased range of motion;Decreased balance;Decreased mobility;Decreased knowledge of use of DME;Decreased knowledge of precautions;Pain;Obesity  PT Treatment Interventions DME instruction;Gait training;Stair training;Functional mobility training;Therapeutic activities;Therapeutic exercise;Balance training;Neuromuscular re-education;Patient/family education   PT Goals (Current goals can be found in the Care Plan section) Acute Rehab PT Goals Patient Stated Goal: return home PT Goal Formulation: With patient Time For Goal Achievement: 10/18/15 Potential to Achieve Goals: Good    Frequency 7X/week   Barriers to discharge Inaccessible home environment bedroom upstairs but pt plans to stay down first few days    Co-evaluation               End of Session Equipment Utilized During Treatment: Gait belt Activity Tolerance: Other (comment) (limited by nausea) Patient left: in chair;with call bell/phone within reach;with family/visitor present Nurse Communication: Mobility status;Other (comment) (requests nausea meds)         Time: [id number] PT Time Calculation (min) (ACUTE ONLY): 35 min   Charges:   PT Evaluation $PT Eval Moderate Complexity: 1 Procedure PT Treatments $Therapeutic Activity: 8-22 mins   PT G Codes:       Leighton Roach, PT  Acute Rehab Services  [phone]  Leighton Roach 10/11/2015, 4:31 PM

## 2015-10-11 NOTE — Anesthesia Procedure Notes (Signed)
Procedure Name: Intubation Date/Time: 10/11/2015 8:45 AM Performed by: Collier Bullock Pre-anesthesia Checklist: Patient identified, Emergency Drugs available, Patient being monitored and Suction available Patient Re-evaluated:Patient Re-evaluated prior to inductionOxygen Delivery Method: Circle system utilized Preoxygenation: Pre-oxygenation with 100% oxygen Intubation Type: IV induction Laryngoscope Size: Mac and 3 Grade View: Grade I Tube type: Oral Tube size: 7.0 mm Number of attempts: 1 Airway Equipment and Method: Stylet Placement Confirmation: ETT inserted through vocal cords under direct vision,  positive ETCO2 and breath sounds checked- equal and bilateral Secured at: 23 cm Tube secured with: Tape Dental Injury: Teeth and Oropharynx as per pre-operative assessment

## 2015-10-11 NOTE — Progress Notes (Signed)
Orthopedic Tech Progress Note Patient Details:  Candice Watkins 07/28/1970 [id number]  CPM Right Knee CPM Right Knee: On Right Knee Flexion (Degrees): 90 Right Knee Extension (Degrees): 0 Additional Comments: trapeze bar patient helper Viewed order from doctor's order list  [name] 10/11/2015, 11:33 AM

## 2015-10-11 NOTE — Transfer of Care (Signed)
Immediate Anesthesia Transfer of Care Note  Patient: Candice Watkins  Procedure(s) Performed: Procedure(s): TOTAL KNEE ARTHROPLASTY (Right)  Patient Location: PACU  Anesthesia Type:General  Level of Consciousness: awake, alert , oriented and patient cooperative  Airway & Oxygen Therapy: Patient Spontanous Breathing and Patient connected to face mask oxygen  Post-op Assessment: Report given to RN, Post -op Vital signs reviewed and stable, Patient moving all extremities X 4 and Patient able to stick tongue midline  Post vital signs: Reviewed and stable  Last Vitals:  Filed Vitals:   10/11/15 0728 10/11/15 0730  BP: 96/43 88/73  Pulse: 64 71  Temp:  37 C  Resp: 18 20    Last Pain:  Filed Vitals:   10/11/15 1036  PainSc: 6       Patients Stated Pain Goal: 3 ([id number])  Complications: No apparent anesthesia complications

## 2015-10-11 NOTE — Progress Notes (Signed)
Orthopedic Tech Progress Note Patient Details:  Candice Watkins [birth year] [id number] Ortho visit put on cpm at [facility] [place] Patient ID: Candice Watkins, female   DOB: [birth year], 45 y.o.   MRN: [id number]   [name] 10/11/2015, 6:37 PM

## 2015-10-11 NOTE — Anesthesia Postprocedure Evaluation (Signed)
Anesthesia Post Note  Patient: Candice Watkins  Procedure(s) Performed: Procedure(s) (LRB): TOTAL KNEE ARTHROPLASTY (Right)  Patient location during evaluation: PACU Anesthesia Type: General Level of consciousness: awake and alert Pain management: pain level controlled Vital Signs Assessment: post-procedure vital signs reviewed and stable Respiratory status: spontaneous breathing, nonlabored ventilation, respiratory function stable and patient connected to nasal cannula oxygen Cardiovascular status: blood pressure returned to baseline and stable Postop Assessment: no signs of nausea or vomiting Anesthetic complications: no    Last Vitals:  Filed Vitals:   10/11/15 1216 10/11/15 1314  BP: 93/49 95/55  Pulse: 76 82  Temp:  36.6 C  Resp: 15 16    Last Pain:  Filed Vitals:   10/11/15 1358  PainSc: 8                  Abubakar Crispo L

## 2015-10-11 NOTE — Discharge Summary (Addendum)
Patient ID: Candice Watkins MRN: [id number] DOB/AGE: Nov 22, 1970 46 y.o.  Admit date: 10/11/2015 Discharge date: 10/13/2015  Admission Diagnoses:  Active Problems:   S/P total knee replacement   Discharge Diagnoses:  Same  Past Medical History  Diagnosis Date  . Hypertension   . Asthma   . Hypothyroidism   . Depression   . Anxiety   . Kidney stones   . PONV (postoperative nausea and vomiting) X 1  . Migraine     "monthly" (10/11/2015)  . Arthritis     "knees, ankles" (10/11/2015)    Surgeries: Procedure(s): TOTAL KNEE ARTHROPLASTY on 10/11/2015   Consultants:    Discharged Condition: Improved  Hospital Course: Candice Watkins is an 45 y.o. female who was admitted 10/11/2015 for operative treatment of primary localized osteoarthritis right knee. Patient has severe unremitting pain that affects sleep, daily activities, and work/hobbies. After pre-op clearance the patient was taken to the operating room on 10/11/2015 and underwent  Procedure(s): TOTAL KNEE ARTHROPLASTY.  Patient with a pre-op Hb of 12.1 developed ABLA on pod#1with a Hb of 10.5.  She is currently stable but we will continue to follow.  Patient was given perioperative antibiotics:      Anti-infectives    Start     Dose/Rate Route Frequency Ordered Stop   10/11/15 1400  ceFAZolin (ANCEF) IVPB 2g/100 mL premix     2 g 200 mL/hr over 30 Minutes Intravenous Every 6 hours 10/11/15 1226 10/11/15 2201   10/11/15 0800  ceFAZolin (ANCEF) 3 g in dextrose 5 % 50 mL IVPB     3 g 130 mL/hr over 30 Minutes Intravenous To ShortStay Surgical 10/10/15 1005 10/11/15 0855       Patient was given sequential compression devices, early ambulation, and chemoprophylaxis to prevent DVT.  Patient benefited maximally from hospital stay and there were no complications.    Recent vital signs:  Patient Vitals for the past 24 hrs:  BP Temp Temp src Pulse Resp SpO2  10/13/15 0629 (!) 101/42 mmHg 97.4 F (36.3 C) Oral 78 16  95 %  10/12/15 2358 (!) 89/38 mmHg 98.7 F (37.1 C) - 80 - 99 %  10/12/15 1551 (!) 101/45 mmHg - - - - -  10/12/15 1300 122/61 mmHg 98.7 F (37.1 C) - 75 17 94 %  10/12/15 0911 119/63 mmHg - - 80 - -     Recent laboratory studies:   Recent Labs  10/12/15 0629 10/13/15 0621  WBC 10.1 9.8  HGB 10.5* 9.7*  HCT 33.3* 29.6*  PLT 271 249  NA 130*  --   K 3.4*  --   CL 96*  --   CO2 23  --   BUN 7  --   CREATININE 0.81  --   GLUCOSE 129*  --   CALCIUM 8.6*  --      Discharge Medications:     Medication List    TAKE these medications        ALPRAZolam 1 MG tablet  Commonly known as:  XANAX  Take 1 mg by mouth daily.     apixaban 2.5 MG Tabs tablet  Commonly known as:  ELIQUIS  Take 1 tab po q12 hours x 3 weeks following surgery to prevent blood clots     budesonide-formoterol 80-4.5 MCG/ACT inhaler  Commonly known as:  SYMBICORT  Inhale 1 puff into the lungs 2 (two) times daily.     cetirizine 10 MG tablet  Commonly known as:  ZYRTEC  Take 10 mg by mouth daily as needed for allergies.     levothyroxine 100 MCG tablet  Commonly known as:  SYNTHROID, LEVOTHROID  Take 100 mcg by mouth daily.     lisinopril-hydrochlorothiazide 10-12.5 MG tablet  Commonly known as:  PRINZIDE,ZESTORETIC  Take 1 tablet by mouth daily.     methocarbamol 500 MG tablet  Commonly known as:  ROBAXIN  Take 1 tablet (500 mg total) by mouth 4 (four) times daily.     metoprolol 50 MG tablet  Commonly known as:  LOPRESSOR  Take 50 mg by mouth daily.     ondansetron 4 MG tablet  Commonly known as:  ZOFRAN  Take 1 tablet (4 mg total) by mouth every 8 (eight) hours as needed for nausea or vomiting.     oxyCODONE-acetaminophen 5-325 MG tablet  Commonly known as:  ROXICET  Take 1-2 tablets by mouth every 4 (four) hours as needed.     simvastatin 20 MG tablet  Commonly known as:  ZOCOR  Take 20 mg by mouth daily.     TRINTELLIX 10 MG Tabs  Generic drug:  Vortioxetine HBr  Take 1  tablet by mouth daily.        Diagnostic Studies: Dg Knee Right Port  10/11/2015  CLINICAL DATA:  Status post right total knee arthroplasty EXAM: PORTABLE RIGHT KNEE - 1-2 VIEW COMPARISON:  None. FINDINGS: Status post right total knee arthroplasty, with well-positioned right distal femoral, posterior patellar and right proximal tibial prostheses. No right knee dislocation. Expected postoperative gas within and surrounding the right knee joint anteriorly. No osseous fracture or suspicious focal osseous lesion. IMPRESSION: Satisfactory appearance status post right total knee arthroplasty. Electronically Signed   By: Ilona Sorrel M.D.   On: 10/11/2015 12:34    Disposition: 01-Home or Self Care    Follow-up Information    Follow up with Ninetta Lights, MD. Schedule an appointment as soon as possible for a visit in 2 weeks.   Specialty:  Orthopedic Surgery   Contact information:   [place] [address] [phone]       Follow up with [facility].   Why:  They will contact you to schedule home therapy visits.    Contact information:   [address] [place] [address] [phone]        Signed: Fannie Knee 10/13/2015, 7:43 AM

## 2015-10-11 NOTE — H&P (View-Only) (Signed)
TOTAL KNEE ADMISSION H&P  Patient is being admitted for right total knee arthroplasty.  Subjective:  Chief Complaint:right knee pain.  HPI: Candice Watkins, 45 y.o. female, has a history of pain and functional disability in the right knee due to arthritis and has failed non-surgical conservative treatments for greater than 12 weeks to includecorticosteriod injections.  Onset of symptoms was gradual, starting 4 years ago with rapidlly worsening course since that time. The patient noted prior procedures on the knee to include  arthroscopy and menisectomy on the right knee(s).  Patient currently rates pain in the right knee(s) at 8 out of 10 with activity. Patient has night pain, worsening of pain with activity and weight bearing, crepitus and joint swelling.  Patient has evidence of subchondral sclerosis and joint space narrowing by imaging studies. There is no active infection.  There are no active problems to display for this patient.  No past medical history on file.  Past Surgical History  Procedure Laterality Date  . Abdominal hysterectomy    . Thyroid surgery    . Knee arthroscopy      right  . Breast biopsy Bilateral     neg- core     (Not in a hospital admission) Allergies  Allergen Reactions  . Septra [Sulfamethoxazole-Trimethoprim] Nausea Only    Social History  Substance Use Topics  . Smoking status: Never Smoker   . Smokeless tobacco: Not on file  . Alcohol Use: No    Family History  Problem Relation Age of Onset  . Breast cancer Neg Hx      Review of Systems  Constitutional: Negative.   HENT: Negative.   Eyes: Negative.   Respiratory: Negative.   Cardiovascular: Negative.   Gastrointestinal: Negative.   Genitourinary: Negative.   Musculoskeletal: Positive for joint pain.  Skin: Negative.   Neurological: Negative.   Endo/Heme/Allergies: Negative.   Psychiatric/Behavioral: Negative.     Objective:  Physical Exam  Constitutional: She is oriented  to person, place, and time. She appears well-developed and well-nourished.  HENT:  Head: Normocephalic and atraumatic.  Eyes: EOM are normal. Pupils are equal, round, and reactive to light.  Neck: Normal range of motion. Neck supple.  Cardiovascular: Normal rate and regular rhythm.   Respiratory: Effort normal and breath sounds normal.  GI: Soft. Bowel sounds are normal.  Musculoskeletal:  Negative straight leg raise.  Negative log roll of both hips.  The right knee has marked varus.  Tibiofemoral translation.  Motion 0 to maybe 100.  Marked tibiofemoral crepitus.    Neurological: She is alert and oriented to person, place, and time.  Skin: Skin is warm and dry.  Psychiatric: She has a normal mood and affect. Her behavior is normal. Judgment and thought content normal.    Vital signs in last 24 hours: @VSRANGES @  Labs:   Estimated body mass index is 42.55 kg/(m^2) as calculated from the following:   Height as of 12/15/14: 5\' 4"  (1.626 m).   Weight as of 12/15/14: 112.492 kg (248 lb).   Imaging Review Plain radiographs demonstrate severe degenerative joint disease of the right knee(s). The overall alignment issignificant varus. The bone quality appears to be fair for age and reported activity level.  Assessment/Plan:  End stage arthritis, right knee   The patient history, physical examination, clinical judgment of the provider and imaging studies are consistent with end stage degenerative joint disease of the right knee(s) and total knee arthroplasty is deemed medically necessary. The treatment options including medical management,  injection therapy arthroscopy and arthroplasty were discussed at length. The risks and benefits of total knee arthroplasty were presented and reviewed. The risks due to aseptic loosening, infection, stiffness, patella tracking problems, thromboembolic complications and other imponderables were discussed. The patient acknowledged the explanation, agreed to  proceed with the plan and consent was signed. Patient is being admitted for inpatient treatment for surgery, pain control, PT, OT, prophylactic antibiotics, VTE prophylaxis, progressive ambulation and ADL's and discharge planning. The patient is planning to be discharged home with home health services

## 2015-10-11 NOTE — Interval H&P Note (Signed)
History and Physical Interval Note:  10/11/2015 8:32 AM  Candice Watkins  has presented today for surgery, with the diagnosis of djd right knee  The various methods of treatment have been discussed with the patient and family. After consideration of risks, benefits and other options for treatment, the patient has consented to  Procedure(s): TOTAL KNEE ARTHROPLASTY (Right) as a surgical intervention .  The patient's history has been reviewed, patient examined, no change in status, stable for surgery.  I have reviewed the patient's chart and labs.  Questions were answered to the patient's satisfaction.     Candice Watkins

## 2015-10-12 ENCOUNTER — Encounter (HOSPITAL_COMMUNITY): Payer: Self-pay | Admitting: Orthopedic Surgery

## 2015-10-12 LAB — BASIC METABOLIC PANEL
Anion gap: 11 (ref 5–15)
BUN: 7 mg/dL (ref 6–20)
CALCIUM: 8.6 mg/dL — AB (ref 8.9–10.3)
CO2: 23 mmol/L (ref 22–32)
Chloride: 96 mmol/L — ABNORMAL LOW (ref 101–111)
Creatinine, Ser: 0.81 mg/dL (ref 0.44–1.00)
GFR calc Af Amer: >60 mL/min (ref >60)
GLUCOSE: 129 mg/dL — AB (ref 65–99)
POTASSIUM: 3.4 mmol/L — AB (ref 3.5–5.1)
Sodium: 130 mmol/L — ABNORMAL LOW (ref 135–145)

## 2015-10-12 LAB — CBC
HEMATOCRIT: 33.3 % — AB (ref 36.0–46.0)
Hemoglobin: 10.5 g/dL — ABNORMAL LOW (ref 12.0–15.0)
MCH: 27.3 pg (ref 26.0–34.0)
MCHC: 31.5 g/dL (ref 30.0–36.0)
MCV: 86.7 fL (ref 78.0–100.0)
PLATELETS: 271 10*3/uL (ref 150–400)
RBC: 3.84 MIL/uL — ABNORMAL LOW (ref 3.87–5.11)
RDW: 14.1 % (ref 11.5–15.5)
WBC: 10.1 10*3/uL (ref 4.0–10.5)

## 2015-10-12 NOTE — Op Note (Signed)
Candice Watkins, Candice Watkins          ACCOUNT NO.:  [id number]  MEDICAL RECORD NO.:  [id number]  LOCATION:  5N19C                        FACILITY:  [facility]  PHYSICIAN:  Ninetta Lights, M.D. DATE OF BIRTH:  05/02/1971  DATE OF PROCEDURE: DATE OF DISCHARGE:                              OPERATIVE REPORT   PREOPERATIVE DIAGNOSIS:  Right knee end-stage degenerative arthritis, primary localized.  POSTOPERATIVE DIAGNOSIS:  Right knee end-stage degenerative arthritis, primary localized.  PROCEDURE:  Modified minimally invasive right total knee replacement utilizing Stryker prosthesis.  A cemented pegged #2 cruciate retaining femoral component.  Cemented #2 tibial component, 11 mm CS insert. Resurfacing cemented 32 mm patellar component.  SURGEON:  Ninetta Lights, MD  ASSISTANT:  Elmyra Ricks, PA, present throughout the entire case and necessary for timely completion of procedure.  ANESTHESIA:  General.  BLOOD LOSS:  Minimal.  SPECIMENS:  None.  CULTURES:  None.  COMPLICATIONS:  None.  DRESSING:  Soft compressive knee immobilizer.  TOURNIQUET TIME:  50 minutes.  Note, the patient had associated morbid obesity making everything more difficult throughout.  PROCEDURE IN DETAIL:  Patient was brought to the operating room and after adequate anesthesia had been obtained, tourniquet applied, prepped and draped in usual sterile fashion.  Exsanguinated with elevation of Esmarch.  Tourniquet inflated to 350 mmHg.  Straight incision above the patella down to tibial tubercle.  Medial arthrotomy, vastus splitting. Exuberant spurs throughout debrided.  Flexible intramedullary guide, distal femur 8 mm resection at 5 degrees of valgus.  Using epicondylar axis, the femur was sized, cut, and fitted for a cruciate retaining #2 component.  Extramedullary guide, proximal tibial resection, 3-degree posterior slope cut below the defect medially.  Sized for #2 component. Patella exposed.   Posterior 10 mm removed, drilled, sized, and fitted for a 32-mm component.  Trials put in place with 11 mm CS insert. Pleased with balancing, stability, motion, alignment, and tracking. Tibia was marked for rotation and reamed.  All trials removed.  Copious irrigation with pulse irrigating device.  Cement prepared and placed on all components, firmly seated.  Polyethylene attached to tibia, knee reduced.  Patella held with a clamp.  Once cement hardened, knee was irrigated again.  Soft tissues injected with Exparel. Arthrotomy closed with #1 Vicryl.  Subcutaneous and subcuticular closure.  Margins were injected with Marcaine.  Sterile compressive dressing applied.  Tourniquet deflated, removed.  Knee immobilizer applied.  Anesthesia reversed.  Brought to the recovery room.  Tolerated the surgery well, no complications.     Ninetta Lights, M.D.     DFM/MEDQ  D:  10/11/2015  T:  10/11/2015  Job:  [id number]

## 2015-10-12 NOTE — Progress Notes (Signed)
Subjective: 1 Day Post-Op Procedure(s) (LRB): TOTAL KNEE ARTHROPLASTY (Right) Patient reports pain as moderate.  Patient reports dizziness when standing.  No nausea/vomiting, chest pain/sob.  Objective: Vital signs in last 24 hours: Temp:  [97.6 F (36.4 C)-98.6 F (37 C)] 98.5 F (36.9 C) (05/25 0107) Pulse Rate:  [64-93] 93 (05/25 0107) Resp:  [10-27] 17 (05/25 0107) BP: (88-131)/(43-74) 131/65 mmHg (05/25 0107) SpO2:  [91 %-100 %] 100 % (05/25 0107) Weight:  [116.574 kg (257 lb)] 116.574 kg (257 lb) (05/24 0726)  Intake/Output from previous day: 05/24 0701 - 05/25 0700 In: 1200 [I.V.:1200] Out: 225 [Urine:200; Blood:25] Intake/Output this shift:    No results for input(s): HGB in the last 72 hours. No results for input(s): WBC, RBC, HCT, PLT in the last 72 hours. No results for input(s): NA, K, CL, CO2, BUN, CREATININE, GLUCOSE, CALCIUM in the last 72 hours. No results for input(s): LABPT, INR in the last 72 hours.  Neurologically intact Neurovascular intact Sensation intact distally Intact pulses distally Dorsiflexion/Plantar flexion intact Compartment soft  Assessment/Plan: 1 Day Post-Op Procedure(s) (LRB): TOTAL KNEE ARTHROPLASTY (Right) Advance diet Up with therapy D/C IV fluids Discharge home with home health likely tomorrow WBAT RLE Dry dressing change prn  Fannie Knee 10/12/2015, 6:52 AM

## 2015-10-12 NOTE — Progress Notes (Signed)
Orthopedic Tech Progress Note Patient Details:  Candice Watkins 03-Dec-1970 [id number]  Patient ID: [name], female   DOB: 15-Sep-1970, 45 y.o.   MRN: [id number] Applied cpm 0-60  Karolee Stamps 10/12/2015, 6:13 AM

## 2015-10-12 NOTE — Progress Notes (Signed)
Physical Therapy Treatment Patient Details Name: MASHAYLA DILONE MRN: [id number] DOB: 1970/07/18 Today's Date: 10/12/2015    History of Present Illness Pt presents for right TKA. PMH: PONV, HTN, asthma, hypothyroid    PT Comments    Pt with c/o dizziness BP 101/45.  RN informed therapeutic exercise deferred due to lethargy.    Follow Up Recommendations  Home health PT;Supervision for mobility/OOB     Equipment Recommendations  Rolling walker with 5" wheels;3in1 (PT)    Recommendations for Other Services OT consult     Precautions / Restrictions Precautions Precautions: Knee Precaution Booklet Issued: No Precaution Comments: Reviewed not placing pillow, ice pack or other object under knee Restrictions Weight Bearing Restrictions: Yes RLE Weight Bearing: Weight bearing as tolerated    Mobility  Bed Mobility Overal bed mobility: Needs Assistance Bed Mobility: Supine to Sit     Supine to sit: Supervision;Min assist     General bed mobility comments: Cues for hand placement to advance to edge of bed.  Sit to supine required min assist to lift RLE into bed.    Transfers Overall transfer level: Needs assistance Equipment used: Rolling walker (2 wheeled) Transfers: Sit to/from Stand Sit to Stand: Supervision Stand pivot transfers: Supervision       General transfer comment:  Verbal cues for safe hand placement on seated surfaces. Increased time and effort.  Cues for forward advancement of RLE.    Ambulation/Gait Ambulation/Gait assistance: Min guard Ambulation Distance (Feet): 36 Feet (x2 + 12 ft. ) Assistive device: Rolling walker (2 wheeled) Gait Pattern/deviations: Step-to pattern;Trunk flexed;Decreased stride length Gait velocity: slow   General Gait Details: Pt performed increased gait, required cues for R heel strike and knee extension in stance phase.  Required cues for upper trunk control and RW safety to keep close proximity.     Stairs             Wheelchair Mobility    Modified Rankin (Stroke Patients Only)       Balance Overall balance assessment: Needs assistance Sitting-balance support: No upper extremity supported;Feet supported Sitting balance-Leahy Scale: Good     Standing balance support: Bilateral upper extremity supported;During functional activity Standing balance-Leahy Scale: Poor Standing balance comment: Reliant on RW for UE support to maintain balance                    Cognition Arousal/Alertness: Lethargic;Suspect due to medications Behavior During Therapy: WFL for tasks assessed/performed Overall Cognitive Status: Within Functional Limits for tasks assessed                      Exercises Total Joint Exercises Ankle Circles/Pumps:  (deferred ther ex due to pre syncope and increased time with mobility.  )    General Comments        Pertinent Vitals/Pain Pain Assessment: 0-10 Pain Score: 10-Worst pain ever Pain Location: R knee Pain Descriptors / Indicators: Grimacing;Guarding Pain Intervention(s): Monitored during session;Repositioned    Home Living Family/patient expects to be discharged to:: Private residence Living Arrangements: Children Available Help at Discharge: Family;Available PRN/intermittently Type of Home: House Home Access: Level entry   Home Layout: Two level;1/2 bath on main level Home Equipment: None Additional Comments: pt's 17 yo daughter lives with her and can help when not in school. Pt also has good extended family support    Prior Function Level of Independence: Needs assistance  Gait / Transfers Assistance Needed: pt reports that she fell several times before surgery  due to her right knee giving way ADL's / Homemaking Assistance Needed: daughter sometimes needed to help her with bathing and dressing     PT Goals (current goals can now be found in the care plan section) Acute Rehab PT Goals Patient Stated Goal: return home Potential to Achieve  Goals: Good Progress towards PT goals: Progressing toward goals    Frequency  7X/week    PT Plan Current plan remains appropriate    Co-evaluation             End of Session Equipment Utilized During Treatment: Gait belt Activity Tolerance: Patient tolerated treatment well;Patient limited by fatigue Patient left: in chair;with call bell/phone within reach;with family/visitor present     Time: [id number] PT Time Calculation (min) (ACUTE ONLY): 43 min  Charges:  $Gait Training: 8-22 mins $Therapeutic Activity: 23-37 mins                    G Codes:      Cristela Blue 10-30-15, 3:56 PM Governor Rooks, PTA pager [phone]

## 2015-10-12 NOTE — Discharge Instructions (Addendum)
INSTRUCTIONS AFTER JOINT REPLACEMENT  ° °o Remove items at home which could result in a fall. This includes throw rugs or furniture in walking pathways °o ICE to the affected joint every three hours while awake for 30 minutes at a time, for at least the first 3-5 days, and then as needed for pain and swelling.  Continue to use ice for pain and swelling. You may notice swelling that will progress down to the foot and ankle.  This is normal after surgery.  Elevate your leg when you are not up walking on it.   °o Continue to use the breathing machine you got in the hospital (incentive spirometer) which will help keep your temperature down.  It is common for your temperature to cycle up and down following surgery, especially at night when you are not up moving around and exerting yourself.  The breathing machine keeps your lungs expanded and your temperature down. ° ° °DIET:  As you were doing prior to hospitalization, we recommend a well-balanced diet. ° °DRESSING / WOUND CARE / SHOWERING ° °Keep the surgical dressing until follow up.  The dressing is water proof, so you can shower without any extra covering.  IF THE DRESSING FALLS OFF or the wound gets wet inside, change the dressing with sterile gauze.  Please use good hand washing techniques before changing the dressing.  Do not use any lotions or creams on the incision until instructed by your surgeon.   ° °ACTIVITY ° °o Increase activity slowly as tolerated, but follow the weight bearing instructions below.   °o No driving for 6 weeks or until further direction given by your physician.  You cannot drive while taking narcotics.  °o No lifting or carrying greater than 10 lbs. until further directed by your surgeon. °o Avoid periods of inactivity such as sitting longer than an hour when not asleep. This helps prevent blood clots.  °o You may return to work once you are authorized by your doctor.  ° ° ° °WEIGHT BEARING  ° °Weight bearing as tolerated with assist  device (walker, cane, etc) as directed, use it as long as suggested by your surgeon or therapist, typically at least 4-6 weeks. ° ° °EXERCISES ° °Results after joint replacement surgery are often greatly improved when you follow the exercise, range of motion and muscle strengthening exercises prescribed by your doctor. Safety measures are also important to protect the joint from further injury. Any time any of these exercises cause you to have increased pain or swelling, decrease what you are doing until you are comfortable again and then slowly increase them. If you have problems or questions, call your caregiver or physical therapist for advice.  ° °Rehabilitation is important following a joint replacement. After just a few days of immobilization, the muscles of the leg can become weakened and shrink (atrophy).  These exercises are designed to build up the tone and strength of the thigh and leg muscles and to improve motion. Often times heat used for twenty to thirty minutes before working out will loosen up your tissues and help with improving the range of motion but do not use heat for the first two weeks following surgery (sometimes heat can increase post-operative swelling).  ° °These exercises can be done on a training (exercise) mat, on the floor, on a table or on a bed. Use whatever works the best and is most comfortable for you.    Use music or television while you are exercising so that   the exercises are a pleasant break in your day. This will make your life better with the exercises acting as a break in your routine that you can look forward to.   Perform all exercises about fifteen times, three times per day or as directed.  You should exercise both the operative leg and the other leg as well. ° °Exercises include: °  °• Quad Sets - Tighten up the muscle on the front of the thigh (Quad) and hold for 5-10 seconds.   °• Straight Leg Raises - With your knee straight (if you were given a brace, keep it on),  lift the leg to 60 degrees, hold for 3 seconds, and slowly lower the leg.  Perform this exercise against resistance later as your leg gets stronger.  °• Leg Slides: Lying on your back, slowly slide your foot toward your buttocks, bending your knee up off the floor (only go as far as is comfortable). Then slowly slide your foot back down until your leg is flat on the floor again.  °• Angel Wings: Lying on your back spread your legs to the side as far apart as you can without causing discomfort.  °• Hamstring Strength:  Lying on your back, push your heel against the floor with your leg straight by tightening up the muscles of your buttocks.  Repeat, but this time bend your knee to a comfortable angle, and push your heel against the floor.  You may put a pillow under the heel to make it more comfortable if necessary.  ° °A rehabilitation program following joint replacement surgery can speed recovery and prevent re-injury in the future due to weakened muscles. Contact your doctor or a physical therapist for more information on knee rehabilitation.  ° ° °CONSTIPATION ° °Constipation is defined medically as fewer than three stools per week and severe constipation as less than one stool per week.  Even if you have a regular bowel pattern at home, your normal regimen is likely to be disrupted due to multiple reasons following surgery.  Combination of anesthesia, postoperative narcotics, change in appetite and fluid intake all can affect your bowels.  ° °YOU MUST use at least one of the following options; they are listed in order of increasing strength to get the job done.  They are all available over the counter, and you may need to use some, POSSIBLY even all of these options:   ° °Drink plenty of fluids (prune juice may be helpful) and high fiber foods °Colace 100 mg by mouth twice a day  °Senokot for constipation as directed and as needed Dulcolax (bisacodyl), take with full glass of water  °Miralax (polyethylene glycol)  once or twice a day as needed. ° °If you have tried all these things and are unable to have a bowel movement in the first 3-4 days after surgery call either your surgeon or your primary doctor.   ° °If you experience loose stools or diarrhea, hold the medications until you stool forms back up.  If your symptoms do not get better within 1 week or if they get worse, check with your doctor.  If you experience "the worst abdominal pain ever" or develop nausea or vomiting, please contact the office immediately for further recommendations for treatment. ° ° °ITCHING:  If you experience itching with your medications, try taking only a single pain pill, or even half a pain pill at a time.  You can also use Benadryl over the counter for itching or also to   help with sleep.   TED HOSE STOCKINGS:  Use stockings on both legs until for at least 2 weeks or as directed by physician office. They may be removed at night for sleeping.  MEDICATIONS:  See your medication summary on the After Visit Summary that nursing will review with you.  You may have some home medications which will be placed on hold until you complete the course of blood thinner medication.  It is important for you to complete the blood thinner medication as prescribed.  Metoprolol (Lopressor)  Due to your hypotension and dizziness you were instructed to hold this medicine, contact your primary care physician on Tuesday 10/17/15 for further instruction about this home medication.    PRECAUTIONS:  If you experience chest pain or shortness of breath - call 911 immediately for transfer to the hospital emergency department.   If you develop a fever greater that 101 F, purulent drainage from wound, increased redness or drainage from wound, foul odor from the wound/dressing, or calf pain - CONTACT YOUR SURGEON.                                                   FOLLOW-UP APPOINTMENTS:  If you do not already have a post-op appointment, please call the office  for an appointment to be seen by your surgeon.  Guidelines for how soon to be seen are listed in your After Visit Summary, but are typically between 1-4 weeks after surgery.  OTHER INSTRUCTIONS:   Knee Replacement:  Do not place pillow under knee, focus on keeping the knee straight while resting. CPM instructions: 0-90 degrees, 2 hours in the morning, 2 hours in the afternoon, and 2 hours in the evening. Place foam block, curve side up under heel at all times except when in CPM or when walking.  DO NOT modify, tear, cut, or change the foam block in any way.  MAKE SURE YOU:   Understand these instructions.   Get help right away if you are not doing well or get worse.    Thank you for letting us be a part of your medical care team.  It is a privilege we respect greatly.  We hope these instructions will help you stay on track for a fast and full recovery!  Information on my medicine - ELIQUIS (apixaban)  This medication education was reviewed with me or my healthcare representative as part of my discharge preparation.  The pharmacist that spoke with me during my hospital stay was:  Rod Holler  Why was Eliquis prescribed for you? Eliquis was prescribed for you to reduce the risk of blood clots forming after orthopedic surgery.    What do You need to know about Eliquis? Take your Eliquis TWICE DAILY - one tablet in the morning and one tablet in the evening with or without food.  It would be best to take the dose about the same time each day.  If you have difficulty swallowing the tablet whole please discuss with your pharmacist how to take the medication safely.  Take Eliquis exactly as prescribed by your doctor and DO NOT stop taking Eliquis without talking to the doctor who prescribed the medication.  Stopping without other medication to take the place of Eliquis may increase your risk of developing a clot.  After discharge, you should have regular check-up appointments with your  healthcare provider that is prescribing your Eliquis.  What do you do if you miss a dose? If a dose of ELIQUIS is not taken at the scheduled time, take it as soon as possible on the same day and twice-daily administration should be resumed.  The dose should not be doubled to make up for a missed dose.  Do not take more than one tablet of ELIQUIS at the same time.  Important Safety Information A possible side effect of Eliquis is bleeding. You should call your healthcare provider right away if you experience any of the following: ? Bleeding from an injury or your nose that does not stop. ? Unusual colored urine (red or dark brown) or unusual colored stools (red or black). ? Unusual bruising for unknown reasons. ? A serious fall or if you hit your head (even if there is no bleeding).  Some medicines may interact with Eliquis and might increase your risk of bleeding or clotting while on Eliquis. To help avoid this, consult your healthcare provider or pharmacist prior to using any new prescription or non-prescription medications, including herbals, vitamins, non-steroidal anti-inflammatory drugs (NSAIDs) and supplements.  This website has more information on Eliquis (apixaban): http://www.eliquis.com/eliquis/home

## 2015-10-12 NOTE — Progress Notes (Signed)
Physical Therapy Treatment Patient Details Name: Candice Watkins MRN: [id number] DOB: 1971-04-03 Today's Date: 10/12/2015    History of Present Illness Pt presents for right TKA. PMH: PONV, HTN, asthma, hypothyroid    PT Comments    Pt lethargic during tx but able to advance mobility and use bathroom during session.  Pt ROM remains unchanged.  Will continue to follow patient during acute hospitalization.    Follow Up Recommendations  Home health PT;Supervision for mobility/OOB     Equipment Recommendations  Rolling walker with 5" wheels;3in1 (PT)    Recommendations for Other Services       Precautions / Restrictions Precautions Precautions: Knee Precaution Comments: reviewed proper positioning as well as use of CPM and zero knee foam Restrictions Weight Bearing Restrictions: Yes RLE Weight Bearing: Weight bearing as tolerated    Mobility  Bed Mobility               General bed mobility comments: Received in recliner chair.    Transfers Overall transfer level: Needs assistance Equipment used: Rolling walker (2 wheeled) Transfers: Sit to/from Stand Sit to Stand: Supervision Stand pivot transfers: Min guard       General transfer comment: VCs for hand placement and advancement of RLE forward to reduce pain and improve ease with eccentric lowering to seated surface.    Ambulation/Gait Ambulation/Gait assistance: Min guard Ambulation Distance (Feet): 62 Feet Assistive device: Rolling walker (2 wheeled) Gait Pattern/deviations: Step-to pattern;Antalgic;Trunk flexed;Decreased stride length Gait velocity: slow Gait velocity interpretation: Below normal speed for age/gender General Gait Details: Pt performed increased gait, required cues for R heel strike and knee extension in stance phase.  Required cues for upper trunk control and RW safety to keep close proximity.     Stairs            Wheelchair Mobility    Modified Rankin (Stroke Patients  Only)       Balance Overall balance assessment: Needs assistance   Sitting balance-Leahy Scale: Good       Standing balance-Leahy Scale: Poor                      Cognition Arousal/Alertness: Lethargic;Suspect due to medications (pt asking for more pain meds but unable to keep eyes open. ) Behavior During Therapy: [facility] for tasks assessed/performed Overall Cognitive Status: Within Functional Limits for tasks assessed                      Exercises Total Joint Exercises Ankle Circles/Pumps: AROM;Both;20 reps;Supine;Seated Quad Sets: AROM;Right;10 reps;Supine Heel Slides: AAROM;Right;10 reps;Supine Hip ABduction/ADduction: AAROM;Right;10 reps;Supine Straight Leg Raises: AAROM;Right;10 reps;Supine Long Arc Quad: AROM;Right;10 reps;Seated Goniometric ROM: 6-74    General Comments        Pertinent Vitals/Pain Pain Assessment: Faces Pain Score: 10-Worst pain ever Pain Location: R knee Pain Descriptors / Indicators: Sore Pain Intervention(s): Monitored during session;Repositioned;Ice applied    Home Living                      Prior Function            PT Goals (current goals can now be found in the care plan section) Acute Rehab PT Goals Patient Stated Goal: return home Potential to Achieve Goals: Good Progress towards PT goals: Progressing toward goals    Frequency  7X/week    PT Plan Current plan remains appropriate    Co-evaluation  End of Session Equipment Utilized During Treatment: Gait belt Activity Tolerance: Patient tolerated treatment well;Patient limited by fatigue Patient left: in chair;with call bell/phone within reach;with family/visitor present     Time: [id number] PT Time Calculation (min) (ACUTE ONLY): 37 min  Charges:  $Gait Training: 8-22 mins $Therapeutic Exercise: 8-22 mins                    G Codes:      Cristela Blue 10/20/15, 11:44 AM  Governor Rooks, PTA pager [phone]

## 2015-10-12 NOTE — Care Management Note (Addendum)
Case Management Note  Patient Details  Name: Candice Watkins MRN: [id number] Date of Birth: 02-May-1971  Subjective/Objective:             S/p right total knee arthroplasty       Action/Plan: Set up with [facility] for HHPT by MD office. Spoke with patient, gave [facility] choice, she chose to work with [facility]. Patient stated that her mom,sister and daughter will be assisting her after discharge. Contacted James with [facility], rolling walker and 3N1 to be delivered to patient's room.    Expected Discharge Date:                  Expected Discharge Plan:  [place]  In-House Referral:  NA  Discharge planning Services  CM Consult  Post Acute Care Choice:  Durable Medical Equipment, Home Health Choice offered to:  Patient  DME Arranged:  3-N-1, Walker rolling, CPM DME Agency:  [place] [employer]  HH Arranged:  PT HH Agency:  [facility]  Status of Service:  Completed, signed off  Medicare Important Message Given:    Date Medicare IM Given:    Medicare IM give by:    Date Additional Medicare IM Given:    Additional Medicare Important Message give by:     If discussed at [place] of Stay Meetings, dates discussed:    Additional Comments:  Nila Nephew, RN 10/12/2015, 10:31 AM

## 2015-10-12 NOTE — Progress Notes (Signed)
Occupational Therapy Evaluation Patient Details Name: Candice Watkins MRN: [id number] DOB: [birth year] Today's Date: 10/12/2015    History of Present Illness Pt presents for right TKA. PMH: PONV, HTN, asthma, hypothyroid   Clinical Impression   PTA, pt required assistance for dressing and bathing from her daughter to reach LB and used [facility] for mobility. Pt currently requires mod assist for LB ADLs and min assist for functional transfers. Began education on knee precautions, CPM/O degree bone foam use, and compensatory strategies for ADLs. Pt plans to d/c home with intermittent assistance from her daughter, sister and mother. Pt will benefit from continued acute OT to increase independence and safety with ADLs and mobility to allow for safe discharge home. Recommend 3in1 for home use.      No OT follow up;Supervision - Intermittent    Equipment Recommendations  3 in 1 bedside comode;Other (comment) (Adaptive equipment )    Recommendations for Other Services       Precautions / Restrictions Precautions Precautions: Knee Precaution Booklet Issued: No Precaution Comments: Reviewed not placing pillow, ice pack or other object under knee Restrictions Weight Bearing Restrictions: Yes RLE Weight Bearing: Weight bearing as tolerated      Mobility Bed Mobility Overal bed mobility: Needs Assistance Bed Mobility: Supine to Sit     Supine to sit: Min assist     General bed mobility comments: Min assist to move RLE off bed and trunk support to come to sitting position. HOB flat, use of bedrails, exited on L side to simulate home environment.  Transfers Overall transfer level: Needs assistance Equipment used: Rolling walker (2 wheeled) Transfers: Sit to/from Stand Sit to Stand: Min guard Stand pivot transfers: Min guard       General transfer comment: Min guard assist for balance and safety. Verbal cues for safe hand placement on seated surfaces. Increased time and effort     Balance Overall balance assessment: Needs assistance Sitting-balance support: No upper extremity supported;Feet supported Sitting balance-Leahy Scale: Good     Standing balance support: Bilateral upper extremity supported;During functional activity Standing balance-Leahy Scale: Poor Standing balance comment: Reliant on RW for UE support to maintain balance                            ADL Overall ADL's : Needs assistance/impaired     Grooming: Wash/dry hands;Min guard;Standing   Upper Body Bathing: Set up;Sitting   Lower Body Bathing: Sit to/from stand;Moderate assistance   Upper Body Dressing : Set up;Sitting   Lower Body Dressing: Sit to/from stand;Moderate assistance   Toilet Transfer: Minimal assistance;Cueing for safety;Ambulation;BSC;RW Toilet Transfer Details (indicate cue type and reason): BSC over toilet, cues to feel BSC on back of legs before reaching back to sit Toileting- Clothing Manipulation and Hygiene: Set up;Sit to/from stand       Functional mobility during ADLs: Minimal assistance;Rolling walker General ADL Comments: Began education on knee precautions, pain management using ice and elevation, and compensatory strategies for ADLs. No family present for OT eval.     Vision Vision Assessment?: No apparent visual deficits   Perception     Praxis      Pertinent Vitals/Pain Pain Assessment: 0-10 Pain Score: 10-Worst pain ever Pain Location: R knee Pain Descriptors / Indicators: Aching;Sore Pain Intervention(s): Limited activity within patient's tolerance;Monitored during session;Repositioned;Patient requesting pain meds-RN notified     Hand Dominance Right   Extremity/Trunk Assessment Upper Extremity Assessment Upper Extremity Assessment: Overall WFL for  tasks assessed   Lower Extremity Assessment Lower Extremity Assessment: RLE deficits/detail RLE Deficits / Details: decreased ROM and strength as expected post op   Cervical / Trunk  Assessment Cervical / Trunk Assessment: Normal   Communication Communication Communication: No difficulties   Cognition Arousal/Alertness: Awake/alert Behavior During Therapy: WFL for tasks assessed/performed Overall Cognitive Status: Within Functional Limits for tasks assessed                     General Comments       Exercises       Shoulder Instructions      Home Living Family/patient expects to be discharged to:: Private residence Living Arrangements: Children Available Help at Discharge: Family;Available PRN/intermittently Type of Home: House Home Access: Level entry     Home Layout: Two level;1/2 bath on main level Alternate Level Stairs-Number of Steps: flight   Bathroom Shower/Tub: Tub/shower unit;Curtain Shower/tub characteristics: Architectural technologist: Standard     Home Equipment: None   Additional Comments: pt's 81 yo daughter lives with her and can help when not in school. Pt also has good extended family support      Prior Functioning/Environment Level of Independence: Needs assistance  Gait / Transfers Assistance Needed: pt reports that she fell several times before surgery due to her right knee giving way ADL's / Homemaking Assistance Needed: daughter sometimes needed to help her with bathing and dressing        OT Diagnosis: Acute pain   OT Problem List: Decreased range of motion;Decreased activity tolerance;Decreased strength;Impaired balance (sitting and/or standing);Decreased safety awareness;Decreased knowledge of use of DME or AE;Decreased knowledge of precautions;Pain;Obesity   OT Treatment/Interventions: Self-care/ADL training;Therapeutic exercise;DME and/or AE instruction;Energy conservation;Therapeutic activities;Balance training;Patient/family education    OT Goals(Current goals can be found in the care plan section) Acute Rehab OT Goals Patient Stated Goal: return home OT Goal Formulation: With patient Time For Goal  Achievement: 10/26/15 Potential to Achieve Goals: Good ADL Goals Pt Will Perform Lower Body Bathing: with modified independence;sit to/from stand;with adaptive equipment Pt Will Perform Lower Body Dressing: with modified independence;with adaptive equipment;sit to/from stand Pt Will Transfer to Toilet: with modified independence;ambulating;bedside commode (over toilet) Pt Will Perform Toileting - Clothing Manipulation and hygiene: with modified independence;sit to/from stand;sitting/lateral leans Pt Will Perform Tub/Shower Transfer: Tub transfer;with supervision;ambulating;3 in 1;rolling walker  OT Frequency: Min 2X/week   Barriers to D/C: Decreased caregiver support  99 y.o. daughter available intermittently       Co-evaluation              End of Session Equipment Utilized During Treatment: Gait belt;Rolling walker CPM Right Knee CPM Right Knee: Off Nurse Communication: Mobility status;Patient requests pain meds  Activity Tolerance: Patient limited by pain Patient left: in chair;with call bell/phone within reach;Other (comment) (RLE elevated)   Time: [id number] OT Time Calculation (min): 31 min Charges:  OT General Charges $OT Visit: 1 Procedure OT Evaluation $OT Eval Moderate Complexity: 1 Procedure OT Treatments $Self Care/Home Management : 8-22 mins G-Codes:    Redmond Baseman, OTR/L Pager: [phone] 10/12/2015, 2:17 PM

## 2015-10-13 LAB — BASIC METABOLIC PANEL
Anion gap: 9 (ref 5–15)
BUN: 7 mg/dL (ref 6–20)
CALCIUM: 8.4 mg/dL — AB (ref 8.9–10.3)
CHLORIDE: 91 mmol/L — AB (ref 101–111)
CO2: 27 mmol/L (ref 22–32)
CREATININE: 0.73 mg/dL (ref 0.44–1.00)
GFR calc Af Amer: >60 mL/min (ref >60)
GFR calc non Af Amer: >60 mL/min (ref >60)
Glucose, Bld: 112 mg/dL — ABNORMAL HIGH (ref 65–99)
Potassium: 3.4 mmol/L — ABNORMAL LOW (ref 3.5–5.1)
SODIUM: 127 mmol/L — AB (ref 135–145)

## 2015-10-13 LAB — CBC
HCT: 29.6 % — ABNORMAL LOW (ref 36.0–46.0)
HEMOGLOBIN: 9.7 g/dL — AB (ref 12.0–15.0)
MCH: 27.2 pg (ref 26.0–34.0)
MCHC: 32.8 g/dL (ref 30.0–36.0)
MCV: 82.9 fL (ref 78.0–100.0)
Platelets: 249 10*3/uL (ref 150–400)
RBC: 3.57 MIL/uL — ABNORMAL LOW (ref 3.87–5.11)
RDW: 13.6 % (ref 11.5–15.5)
WBC: 9.8 10*3/uL (ref 4.0–10.5)

## 2015-10-13 NOTE — Plan of Care (Signed)
Problem: Acute Rehab OT Goals (only OT should resolve) Goal: Pt. Will Perform Lower Body Bathing Outcome: Not Met (add Reason) Dtr. Assists with all LB ADLS Goal: Pt. Will Perform Lower Body Dressing Outcome: Not Met (add Reason) Dtr. Assists with all LB ADLS Goal: Pt. Will Perform Tub/Shower Transfer Outcome: Not Applicable Date Met:  67/61/95 Pt. Will sponge bathe first few weeks

## 2015-10-13 NOTE — Progress Notes (Signed)
Physical Therapy Treatment Patient Details Name: Candice Watkins MRN: [id number] DOB: Nov 06, 1970 Today's Date: 10/13/2015    History of Present Illness Pt presents for right TKA. PMH: PONV, HTN, asthma, hypothyroid    PT Comments    Pt making gradual progress with mobility during PT sessions. Pt able to ambulate 50 feet with rw and min guard assistance. Pt denies feeling any lightheadedness or near syncope. PT to continue to follow and progress mobility as tolerated in anticipation of D/C to home when medically appropriate.   Follow Up Recommendations  Home health PT;Supervision for mobility/OOB     Equipment Recommendations  Rolling walker with 5" wheels;3in1 (PT)    Recommendations for Other Services       Precautions / Restrictions Precautions Precautions: Knee Restrictions Weight Bearing Restrictions: Yes RLE Weight Bearing: Weight bearing as tolerated    Mobility  Bed Mobility Overal bed mobility: Needs Assistance Bed Mobility: Supine to Sit;Sit to Supine     Supine to sit: Supervision (no rail) Sit to supine: Min assist   General bed mobility comments: min assist Rt LE with sit-supine  Transfers Overall transfer level: Needs assistance Equipment used: Rolling walker (2 wheeled) Transfers: Sit to/from Stand Sit to Stand: Supervision Stand pivot transfers: Supervision       General transfer comment: no cues needed  Ambulation/Gait Ambulation/Gait assistance: Min guard Ambulation Distance (Feet): 50 Feet Assistive device: Rolling walker (2 wheeled) Gait Pattern/deviations: Step-to pattern Gait velocity: decreased   General Gait Details: encouraging step through pattern   Stairs            Wheelchair Mobility    Modified Rankin (Stroke Patients Only)       Balance Overall balance assessment: Needs assistance Sitting-balance support: No upper extremity supported Sitting balance-Leahy Scale: Good     Standing balance support:  Bilateral upper extremity supported Standing balance-Leahy Scale: Poor Standing balance comment: using rw                    Cognition Arousal/Alertness: Awake/alert Behavior During Therapy: WFL for tasks assessed/performed Overall Cognitive Status: Within Functional Limits for tasks assessed                      Exercises Total Joint Exercises Ankle Circles/Pumps: AROM;Both;10 reps Quad Sets: Strengthening;Right;10 reps Heel Slides: AAROM;Right;10 reps Straight Leg Raises: Strengthening;Right;10 reps (mod assist) Goniometric ROM: knee flexion 87 degrees    General Comments        Pertinent Vitals/Pain Pain Assessment: 0-10 Pain Score: 3  Pain Location: Rt knee Pain Descriptors / Indicators: Aching Pain Intervention(s): Limited activity within patient's tolerance;Monitored during session    Home Living                      Prior Function            PT Goals (current goals can now be found in the care plan section) Acute Rehab PT Goals Patient Stated Goal: improve PT Goal Formulation: With patient Time For Goal Achievement: 10/18/15 Potential to Achieve Goals: Good Progress towards PT goals: Progressing toward goals    Frequency  7X/week    PT Plan Current plan remains appropriate    Co-evaluation             End of Session Equipment Utilized During Treatment: Gait belt Activity Tolerance: Patient tolerated treatment well;Patient limited by fatigue Patient left: in bed;with call bell/phone within reach;with SCD's reapplied (in knee extension)  Time: [id number] PT Time Calculation (min) (ACUTE ONLY): 23 min  Charges:  $Gait Training: 8-22 mins $Therapeutic Exercise: 8-22 mins                    G Codes:      Cassell Clement, PT, CSCS Pager [phone] Office [phone]  10/13/2015, 11:39 AM

## 2015-10-13 NOTE — Progress Notes (Signed)
Subjective: 2 Days Post-Op Procedure(s) (LRB): TOTAL KNEE ARTHROPLASTY (Right) Patient reports pain as moderate.  Patient reports lightheadedness when standing.  No chest pain/sob, nausea/vomiting.  No flatus/bm.  Tolerating diet.  Objective: Vital signs in last 24 hours: Temp:  [97.4 F (36.3 C)-98.7 F (37.1 C)] 97.4 F (36.3 C) (05/26 0629) Pulse Rate:  [75-80] 78 (05/26 0629) Resp:  [16-17] 16 (05/26 0629) BP: (89-122)/(38-63) 101/42 mmHg (05/26 0629) SpO2:  [94 %-99 %] 95 % (05/26 0629)  Intake/Output from previous day: 05/25 0701 - 05/26 0700 In: 360 [P.O.:360] Out: -  Intake/Output this shift:     Recent Labs  10/12/15 0629 10/13/15 0621  HGB 10.5* 9.7*    Recent Labs  10/12/15 0629 10/13/15 0621  WBC 10.1 9.8  RBC 3.84* 3.57*  HCT 33.3* 29.6*  PLT 271 249    Recent Labs  10/12/15 0629  NA 130*  K 3.4*  CL 96*  CO2 23  BUN 7  CREATININE 0.81  GLUCOSE 129*  CALCIUM 8.6*   No results for input(s): LABPT, INR in the last 72 hours.  Neurologically intact Neurovascular intact Sensation intact distally Intact pulses distally Dorsiflexion/Plantar flexion intact Compartment soft  Assessment/Plan: 2 Days Post-Op Procedure(s) (LRB): TOTAL KNEE ARTHROPLASTY (Right) Advance diet Up with therapy Plan for discharge tomorrow home with hhpt WBAT RLE ABLA-mild and stable Please replace thigh high ted hose LLE with knee high Dry dressing change prn  Fannie Knee 10/13/2015, 7:52 AM

## 2015-10-13 NOTE — Progress Notes (Signed)
Occupational Therapy Treatment Patient Details Name: Candice Watkins MRN: [id number] DOB: 1970/11/14 Today's Date: 10/13/2015    History of present illness Pt presents for right TKA. PMH: PONV, HTN, asthma, hypothyroid   OT comments  Pt. Making gains with skilled OT.  Able to complete bed mobility and toileting tasks this session.  Declines tub as she will sponge bathe initially with her dtrs. Assistance.  Clear for sign off from OT.  Will alert OTR/L  Follow Up Recommendations  No OT follow up;Supervision - Intermittent    Equipment Recommendations  3 in 1 bedside comode;Other (comment)    Recommendations for Other Services      Precautions / Restrictions Precautions Precautions: Knee Restrictions RLE Weight Bearing: Weight bearing as tolerated       Mobility Bed Mobility Overal bed mobility: Needs Assistance Bed Mobility: Supine to Sit     Supine to sit: Supervision     General bed mobility comments: hob flat, no rails, no trapeze, exited on R side of bed, able to prop up on elbows without physical assist but requires max a to guide R LE to edge of bed while pt. scooting hips towards eob without assist.    Transfers Overall transfer level: Needs assistance Equipment used: Rolling walker (2 wheeled) Transfers: Sit to/from [facility] Sit to Stand: Supervision Stand pivot transfers: Supervision            Balance                                   ADL Overall ADL's : Needs assistance/impaired     Grooming: Wash/dry hands;Wash/dry face;Oral care;Supervision/safety;Standing         Lower Body Bathing Details (indicate cue type and reason): dtr. assists with all LB ADLS       Lower Body Dressing Details (indicate cue type and reason): dtr. assists with all LB ADLS Toilet Transfer: Min guard;Ambulation;Regular Toilet;Grab bars;RW   Toileting- Clothing Manipulation and Hygiene: Supervision/safety;Sitting/lateral Chartered certified accountant Details (indicate cue type and reason): pt. declines transfer as she will be sponge bathing for several weeks due to staying on first floor of her home Functional mobility during ADLs: Min guard;Rolling walker        Vision                     Perception     Praxis      Cognition   Behavior During Therapy: [facility] for tasks assessed/performed Overall Cognitive Status: Within Functional Limits for tasks assessed                       Extremity/Trunk Assessment               Exercises     Shoulder Instructions       General Comments      Pertinent Vitals/ Pain       Pain Assessment: No/denies pain (c/o dizziness and light headed)  Home Living                                          Prior Functioning/Environment              Frequency Min 2X/week     Progress Toward Goals  OT Goals(current goals  can now be found in the care plan section)  Progress towards OT goals: Progressing toward goals     Plan Discharge plan remains appropriate    Co-evaluation                 End of Session Equipment Utilized During Treatment: Gait belt;Rolling walker CPM Right Knee CPM Right Knee: On Right Knee Flexion (Degrees): 60 Right Knee Extension (Degrees): 0   Activity Tolerance Patient tolerated treatment well   Patient Left in chair;with call bell/phone within reach   Nurse Communication          Time: [id number] OT Time Calculation (min): 19 min  Charges: OT General Charges $OT Visit: 1 Procedure OT Treatments $Self Care/Home Management : 8-22 mins  Janice Coffin, COTA/L 10/13/2015, 9:12 AM

## 2015-10-13 NOTE — Progress Notes (Signed)
Physical Therapy Treatment Patient Details Name: Candice Watkins MRN: [id number] DOB: 1970/09/05 Today's Date: 10/13/2015    History of Present Illness Pt presents for right TKA. PMH: PONV, HTN, asthma, hypothyroid    PT Comments    Pt able to increase her ambulation tolerance to 150 ft with rw and supervision. Chair in tow for safety. Pt reports that she is gradually feeling better and hoping to be able to D/C to home tomorrow. PT to continue to follow and assist with mobility and D/C to home.   Follow Up Recommendations  Home health PT;Supervision for mobility/OOB     Equipment Recommendations  Rolling walker with 5" wheels;3in1 (PT)    Recommendations for Other Services       Precautions / Restrictions Precautions Precautions: Knee Precaution Booklet Issued: Yes (comment) Precaution Comments: HEP Restrictions Weight Bearing Restrictions: Yes RLE Weight Bearing: Weight bearing as tolerated    Mobility  Bed Mobility Overal bed mobility: Needs Assistance Bed Mobility: Supine to Sit     Supine to sit: Supervision (using UEs to assist with Rt LE)        Transfers Overall transfer level: Needs assistance Equipment used: Rolling walker (2 wheeled) Transfers: Sit to/from Stand Sit to Stand: Supervision         General transfer comment: no cues needed  Ambulation/Gait Ambulation/Gait assistance: Supervision Ambulation Distance (Feet): 150 Feet Assistive device: Rolling walker (2 wheeled) Gait Pattern/deviations: Step-through pattern;Decreased step length - left;Decreased weight shift to right Gait velocity: decreased   General Gait Details: encouraging even strides, chair in tow due to low BP, asymptomatic throughout session   Stairs            Wheelchair Mobility    Modified Rankin (Stroke Patients Only)       Balance Overall balance assessment: Needs assistance Sitting-balance support: No upper extremity supported Sitting balance-Leahy  Scale: Good     Standing balance support: Bilateral upper extremity supported Standing balance-Leahy Scale: Poor Standing balance comment: using rw                    Cognition Arousal/Alertness: Awake/alert Behavior During Therapy: WFL for tasks assessed/performed Overall Cognitive Status: Within Functional Limits for tasks assessed                      Exercises Total Joint Exercises Ankle Circles/Pumps: AROM;Both;10 reps Quad Sets: Strengthening;Right;10 reps Heel Slides: AAROM;Right;10 reps    General Comments        Pertinent Vitals/Pain Pain Assessment: 0-10 Pain Score: 7  Pain Location: Rt knee Pain Descriptors / Indicators: Aching Pain Intervention(s): Monitored during session    Home Living                      Prior Function            PT Goals (current goals can now be found in the care plan section) Acute Rehab PT Goals Patient Stated Goal: go home PT Goal Formulation: With patient Time For Goal Achievement: 10/18/15 Potential to Achieve Goals: Good Progress towards PT goals: Progressing toward goals    Frequency  7X/week    PT Plan Current plan remains appropriate    Co-evaluation             End of Session Equipment Utilized During Treatment: Gait belt Activity Tolerance: Patient tolerated treatment well;Patient limited by fatigue Patient left: in chair;with call bell/phone within reach (in knee extension)  Time: [id number] PT Time Calculation (min) (ACUTE ONLY): 32 min  Charges:  $Gait Training: 8-22 mins $Therapeutic Exercise: 8-22 mins                    G Codes:      Cassell Clement, PT, CSCS Pager [phone] Office [phone]  10/13/2015, 4:01 PM

## 2015-10-13 NOTE — Progress Notes (Signed)
Orthopedic Tech Progress Note Patient Details:  Candice Watkins [birth year] [id number]  Patient ID: Candice Watkins, female   DOB: 1970/08/26, 45 y.o.   MRN: [id number] Applied cpm 0-60  Karolee Stamps 10/13/2015, 5:53 AM

## 2015-10-14 LAB — CBC
HEMATOCRIT: 28.8 % — AB (ref 36.0–46.0)
Hemoglobin: 9.4 g/dL — ABNORMAL LOW (ref 12.0–15.0)
MCH: 27 pg (ref 26.0–34.0)
MCHC: 32.6 g/dL (ref 30.0–36.0)
MCV: 82.8 fL (ref 78.0–100.0)
Platelets: 253 10*3/uL (ref 150–400)
RBC: 3.48 MIL/uL — ABNORMAL LOW (ref 3.87–5.11)
RDW: 13.5 % (ref 11.5–15.5)
WBC: 8.9 10*3/uL (ref 4.0–10.5)

## 2015-10-14 LAB — BASIC METABOLIC PANEL
Anion gap: 10 (ref 5–15)
BUN: 7 mg/dL (ref 6–20)
CALCIUM: 8.5 mg/dL — AB (ref 8.9–10.3)
CO2: 27 mmol/L (ref 22–32)
CREATININE: 0.76 mg/dL (ref 0.44–1.00)
Chloride: 89 mmol/L — ABNORMAL LOW (ref 101–111)
GFR calc Af Amer: >60 mL/min (ref >60)
GFR calc non Af Amer: >60 mL/min (ref >60)
GLUCOSE: 108 mg/dL — AB (ref 65–99)
Potassium: 3.1 mmol/L — ABNORMAL LOW (ref 3.5–5.1)
Sodium: 126 mmol/L — ABNORMAL LOW (ref 135–145)

## 2015-10-14 MED ORDER — SODIUM CHLORIDE 0.9 % IV SOLN
INTRAVENOUS | Status: DC
  Administered 2015-10-15: 10:00:00 via INTRAVENOUS

## 2015-10-14 NOTE — Progress Notes (Signed)
Subjective: 3 Days Post-Op Procedure(s) (LRB): TOTAL KNEE ARTHROPLASTY (Right) Patient reports pain as mild and moderate.    Objective: Vital signs in last 24 hours: Temp:  [98.3 F (36.8 C)-98.7 F (37.1 C)] 98.3 F (36.8 C) (05/27 0412) Pulse Rate:  [72-78] 77 (05/27 0412) Resp:  [16-46] 18 (05/27 0412) BP: (95-119)/(41-50) 119/50 mmHg (05/27 0412) SpO2:  [94 %-99 %] 99 % (05/27 0412)  Intake/Output from previous day: 05/26 0701 - 05/27 0700 In: 600 [P.O.:600] Out: -  Intake/Output this shift:     Recent Labs  10/12/15 0629 10/13/15 0621 10/14/15 0555  HGB 10.5* 9.7* 9.4*    Recent Labs  10/13/15 0621 10/14/15 0555  WBC 9.8 8.9  RBC 3.57* 3.48*  HCT 29.6* 28.8*  PLT 249 253    Recent Labs  10/13/15 0621 10/14/15 0555  NA 127* 126*  K 3.4* 3.1*  CL 91* 89*  CO2 27 27  BUN 7 7  CREATININE 0.73 0.76  GLUCOSE 112* 108*  CALCIUM 8.4* 8.5*   No results for input(s): LABPT, INR in the last 72 hours.  Neurovascular intact Sensation intact distally Intact pulses distally Incision: dressing C/D/I  Pt in CPM currently  Assessment/Plan: 3 Days Post-Op Procedure(s) (LRB): TOTAL KNEE ARTHROPLASTY (Right) Up with therapy Discharge home with home health  Dressing change prior to d/c today  Chriss Czar 10/14/2015, 7:33 AM

## 2015-10-14 NOTE — Progress Notes (Signed)
Physical Therapy Treatment Patient Details Name: Candice Watkins MRN: [id number] DOB: 09-26-1970 Today's Date: 10/14/2015    History of Present Illness Pt presents for right TKA. PMH: PONV, HTN, asthma, hypothyroid    PT Comments    Pt ambulated 51' with RW and supervision but unable to tolerate further distance due to dizziness. Discussed with pt that if dizziness does not improve, may be safer to discuss SNF for rehab since she will be home alone while daughter is at school. Do not feel she is safe to go home today.  PT will continue to follow.   Follow Up Recommendations  Home health PT;Supervision for mobility/OOB     Equipment Recommendations  Rolling walker with 5" wheels;3in1 (PT)    Recommendations for Other Services OT consult     Precautions / Restrictions Precautions Precautions: Knee Restrictions Weight Bearing Restrictions: Yes RLE Weight Bearing: Weight bearing as tolerated    Mobility  Bed Mobility Overal bed mobility: Needs Assistance Bed Mobility: Supine to Sit     Supine to sit: Modified independent (Device/Increase time)     General bed mobility comments: pt able to get to EOB without any physical assist  Transfers Overall transfer level: Needs assistance Equipment used: Rolling walker (2 wheeled) Transfers: Sit to/from Stand Sit to Stand: Supervision         General transfer comment: supervision for safety due to dizziness  Ambulation/Gait Ambulation/Gait assistance: Supervision Ambulation Distance (Feet): 75 Feet Assistive device: Rolling walker (2 wheeled) Gait Pattern/deviations: Step-through pattern;Decreased weight shift to right;Decreased step length - right Gait velocity: decreased Gait velocity interpretation: <1.8 ft/sec, indicative of risk for recurrent falls General Gait Details: Pt began to feel dizzy after 60', was able to go 15' more but then had to sit and could not continue. Chair brought behind for safety.     Stairs            Wheelchair Mobility    Modified Rankin (Stroke Patients Only)       Balance Overall balance assessment: Needs assistance Sitting-balance support: No upper extremity supported Sitting balance-Leahy Scale: Normal     Standing balance support: Single extremity supported Standing balance-Leahy Scale: Poor                      Cognition Arousal/Alertness: Awake/alert Behavior During Therapy: WFL for tasks assessed/performed Overall Cognitive Status: Within Functional Limits for tasks assessed                      Exercises Total Joint Exercises Ankle Circles/Pumps: AROM;Both;10 reps Quad Sets: Strengthening;Right;10 reps Gluteal Sets: AROM;10 reps Heel Slides: AROM;Right;10 reps;Supine Hip ABduction/ADduction: AROM;Right;10 reps;Supine Straight Leg Raises: AAROM;Right;10 reps;Supine Long Arc Quad: AROM;Right;10 reps;Seated Goniometric ROM: 5-75    General Comments General comments (skin integrity, edema, etc.): pt unable to progress due to continued dizziness      Pertinent Vitals/Pain Pain Assessment: 0-10 Pain Score: 8  Pain Location: right knee Pain Descriptors / Indicators: Aching Pain Intervention(s): Limited activity within patient's tolerance;Monitored during session;Repositioned    Home Living                      Prior Function            PT Goals (current goals can now be found in the care plan section) Acute Rehab PT Goals Patient Stated Goal: go home PT Goal Formulation: With patient Time For Goal Achievement: 10/18/15 Potential to Achieve Goals: Good Progress  towards PT goals: Not progressing toward goals - comment (limited by dizziness)    Frequency  7X/week    PT Plan Current plan remains appropriate    Co-evaluation             End of Session Equipment Utilized During Treatment: Gait belt Activity Tolerance: Other (comment) (limited by dizziness) Patient left: in chair;with  call bell/phone within reach;with family/visitor present (in zero knee foam)     Time: [id number] PT Time Calculation (min) (ACUTE ONLY): 29 min  Charges:  $Gait Training: 8-22 mins $Therapeutic Exercise: 8-22 mins                    G Codes:     Leighton Roach, PT  Acute Rehab Services  [phone]  Leighton Roach 10/14/2015, 3:28 PM

## 2015-10-14 NOTE — Progress Notes (Signed)
Orthopedic Tech Progress Note Patient Details:  Candice Watkins 1971-01-06 [id number]  Patient ID: Candice Watkins, female   DOB: 08-02-1970, 45 y.o.   MRN: [id number] Applied cpm 0-60  Candice Watkins 10/14/2015, 5:47 AM

## 2015-10-14 NOTE — Progress Notes (Signed)
Physical Therapy Treatment Patient Details Name: Candice Watkins MRN: [id number] DOB: 11-22-1970 Today's Date: 10/14/2015    History of Present Illness Pt presents for right TKA. PMH: PONV, HTN, asthma, hypothyroid    PT Comments    Pt progressing very slowly due to dizziness, was able to practice stairs this morning but then could do no further walking due to dizziness, BP 120/53. Pt with poor tolerance to activity. May need another day before safe to be home alone while daughter at school. PT will continue to follow.   Follow Up Recommendations  Home health PT;Supervision for mobility/OOB     Equipment Recommendations  Rolling walker with 5" wheels;3in1 (PT)    Recommendations for Other Services OT consult     Precautions / Restrictions Precautions Precautions: Knee Precaution Comments: pt able to verbalize proper positioning Restrictions Weight Bearing Restrictions: Yes RLE Weight Bearing: Weight bearing as tolerated    Mobility  Bed Mobility Overal bed mobility: Needs Assistance Bed Mobility: Supine to Sit     Supine to sit: Supervision     General bed mobility comments: UE's to assist RLE but pt able to get to EOB with increased time and no physical assist  Transfers Overall transfer level: Needs assistance Equipment used: Rolling walker (2 wheeled) Transfers: Sit to/from Stand Sit to Stand: Supervision         General transfer comment: increased time, safe technique  Ambulation/Gait Ambulation/Gait assistance: Supervision Ambulation Distance (Feet): 10 Feet Assistive device: Rolling walker (2 wheeled) Gait Pattern/deviations: Step-through pattern;Decreased weight shift to right;Decreased step length - right   Gait velocity interpretation: <1.8 ft/sec, indicative of risk for recurrent falls General Gait Details: was unable to ambulate significant distance this morning because she became dizzy after performing stairs and had to sit. Dizziness  persisted after 5 mins in chair. BP 120/53   Stairs Stairs: Yes Stairs assistance: Min assist Stair Management: Backwards;With walker Number of Stairs: 2 General stair comments: practiced 2 steps without rails bkwd with use of RW and then 1 step forwards with RW. Pt performed safely, became dizzy right afterwards  Wheelchair Mobility    Modified Rankin (Stroke Patients Only)       Balance Overall balance assessment: Needs assistance Sitting-balance support: No upper extremity supported Sitting balance-Leahy Scale: Normal     Standing balance support: Single extremity supported Standing balance-Leahy Scale: Poor Standing balance comment: reliant on RW with at least single UE                    Cognition Arousal/Alertness: Awake/alert Behavior During Therapy: Anxious Overall Cognitive Status: Within Functional Limits for tasks assessed                      Exercises Total Joint Exercises Ankle Circles/Pumps: AROM;Both;10 reps Quad Sets: Strengthening;Right;10 reps Gluteal Sets: AROM;10 reps Heel Slides: AROM;Right;10 reps;Supine Straight Leg Raises: AAROM;Right;10 reps;Supine Long Arc Quad: AROM;Right;10 reps;Seated Goniometric ROM: 5-80    General Comments        Pertinent Vitals/Pain Pain Assessment: 0-10 Pain Score: 8  Pain Location: right knee Pain Descriptors / Indicators: Aching Pain Intervention(s): Limited activity within patient's tolerance;Monitored during session;Premedicated before session        BP 120/53, HR 87 bpm, O2 sats 94% on RA  Home Living                      Prior Function  PT Goals (current goals can now be found in the care plan section) Acute Rehab PT Goals Patient Stated Goal: go home PT Goal Formulation: With patient Time For Goal Achievement: 10/18/15 Potential to Achieve Goals: Good Progress towards PT goals: Progressing toward goals    Frequency  7X/week    PT Plan Current plan  remains appropriate    Co-evaluation             End of Session Equipment Utilized During Treatment: Gait belt Activity Tolerance: Other (comment) (limited by dizziness) Patient left: in chair;with call bell/phone within reach;with family/visitor present     Time: 0826-0906 PT Time Calculation (min) (ACUTE ONLY): 40 min  Charges:  $Gait Training: 8-22 mins $Therapeutic Exercise: 8-22 mins $Therapeutic Activity: 8-22 mins                    G Codes:     Leighton Roach, PT  Acute Rehab Services  [place], [place] 10/14/2015, 10:15 AM

## 2015-10-15 NOTE — Progress Notes (Signed)
Subjective: 4 Days Post-Op Procedure(s) (LRB): TOTAL KNEE ARTHROPLASTY (Right) Patient reports pain as mild and moderate.  Patient c/o dizziness with ambulation with PT x2 yesterday.  Diastolic pressures have remained hypotensive throughout hospital admission on home regimen of antihypertensives (lopressor, lisinopril, HCTZ)  Objective: Vital signs in last 24 hours: Temp:  [98.4 F (36.9 C)-99.4 F (37.4 C)] 98.9 F (37.2 C) (05/28 0706) Pulse Rate:  [74-82] 82 (05/28 0706) Resp:  [18] 18 (05/28 0706) BP: (109-122)/(51-55) 113/51 mmHg (05/28 0706) SpO2:  [94 %-98 %] 94 % (05/28 0706)  Intake/Output from previous day: 05/27 0701 - 05/28 0700 In: 225 [I.V.:225] Out: -  Intake/Output this shift:     Recent Labs  10/13/15 0621 10/14/15 0555  HGB 9.7* 9.4*    Recent Labs  10/13/15 0621 10/14/15 0555  WBC 9.8 8.9  RBC 3.57* 3.48*  HCT 29.6* 28.8*  PLT 249 253    Recent Labs  10/13/15 0621 10/14/15 0555  NA 127* 126*  K 3.4* 3.1*  CL 91* 89*  CO2 27 27  BUN 7 7  CREATININE 0.73 0.76  GLUCOSE 112* 108*  CALCIUM 8.4* 8.5*   No results for input(s): LABPT, INR in the last 72 hours.  Neurovascular intact Sensation intact distally Intact pulses distally Dorsiflexion/Plantar flexion intact Incision: dressing C/D/I No cellulitis present  Assessment/Plan: 4 Days Post-Op Procedure(s) (LRB): TOTAL KNEE ARTHROPLASTY (Right) Up with therapy Discharge home with home health  Discussed dizziness and hypotension with Dr. Fred[place] Highland we recommend she hold her lopressor and contact her PCP on Tues for further instruction Would like her to work with PT this AM and hopeful d/c home this afternoon, pt still wishes to go home and does have assistance there.     Chriss Czar 10/15/2015, 9:15 AM

## 2015-10-15 NOTE — Progress Notes (Signed)
Discharge instruction gave to pt and all question answered. Dressing changed per MD order and pt is redy to discharge.

## 2015-10-15 NOTE — Progress Notes (Signed)
Physical Therapy Treatment Patient Details Name: Candice Watkins MRN: [id number] DOB: 1970-11-02 Today's Date: 10/15/2015    History of Present Illness Pt presents for right TKA. PMH: PONV, HTN, asthma, hypothyroid    PT Comments    Pt continues to have dizziness when up however, was able to push through it today and did not feel that she needed to sit to avoid passing out. Ambulated 150' with RW and supervision. Plan to d/c home today, to be followed by HHPT. PT signing off.    Follow Up Recommendations  Home health PT;Supervision for mobility/OOB     Equipment Recommendations  Rolling walker with 5" wheels;3in1 (PT)    Recommendations for Other Services       Precautions / Restrictions Precautions Precautions: Knee Precaution Comments: pt with blanket under heel in bed Restrictions Weight Bearing Restrictions: Yes RLE Weight Bearing: Weight bearing as tolerated    Mobility  Bed Mobility Overal bed mobility: Modified Independent Bed Mobility: Supine to Sit     Supine to sit: Modified independent (Device/Increase time)     General bed mobility comments: bed flat, no rail, pt able to get to edge of bed independently  Transfers Overall transfer level: Modified independent Equipment used: Rolling walker (2 wheeled) Transfers: Sit to/from Stand Sit to Stand: Modified independent (Device/Increase time)         General transfer comment: pt standing to RW safely   Ambulation/Gait Ambulation/Gait assistance: Supervision Ambulation Distance (Feet): 150 Feet Assistive device: Rolling walker (2 wheeled) Gait Pattern/deviations: Step-through pattern;Decreased weight shift to right;Decreased stance time - right Gait velocity: decreased Gait velocity interpretation: <1.8 ft/sec, indicative of risk for recurrent falls General Gait Details: worked on step through gait pattern and increasing gait speed. Pt did have mild dizziness after 55' but was determined to keep  going   Financial trader Rankin (Stroke Patients Only)       Balance Overall balance assessment: Needs assistance   Sitting balance-Leahy Scale: Normal     Standing balance support: Single extremity supported Standing balance-Leahy Scale: Poor Standing balance comment: can stand with single limb support to use other UE                    Cognition Arousal/Alertness: Awake/alert Behavior During Therapy: WFL for tasks assessed/performed Overall Cognitive Status: Within Functional Limits for tasks assessed                      Exercises Total Joint Exercises Ankle Circles/Pumps: AROM;Both;10 reps Quad Sets: Strengthening;Right;10 reps Gluteal Sets: AROM;10 reps Heel Slides: AROM;Right;10 reps;Supine Hip ABduction/ADduction: AROM;Right;10 reps;Seated Straight Leg Raises: Right;10 reps;Supine;AROM Long Arc Quad: AROM;Right;10 reps;Seated Goniometric ROM: 0-75    General Comments General comments (skin integrity, edema, etc.): discussed d/c rec and car transfer      Pertinent Vitals/Pain Pain Assessment: 0-10 Pain Score: 6  Pain Location: right knee Pain Descriptors / Indicators: Aching Pain Intervention(s): Limited activity within patient's tolerance;Monitored during session;Premedicated before session    Home Living                      Prior Function            PT Goals (current goals can now be found in the care plan section) Acute Rehab PT Goals Patient Stated Goal: go home PT Goal Formulation: With patient Time For Goal Achievement: 10/18/15 Potential  to Achieve Goals: Good Progress towards PT goals: Goals met/education completed, patient discharged from PT    Frequency  7X/week    PT Plan Current plan remains appropriate    Co-evaluation             End of Session Equipment Utilized During Treatment: Gait belt Activity Tolerance: Patient tolerated treatment well Patient  left: in chair;with call bell/phone within reach;with family/visitor present     Time: 1002-1028 PT Time Calculation (min) (ACUTE ONLY): 26 min  Charges:  $Gait Training: 23-37 mins                    G Codes:     Leighton Roach, PT  Acute Rehab Services  [phone]  Leighton Roach 10/15/2015, 12:18 PM

## 2015-10-18 ENCOUNTER — Ambulatory Visit
Admission: RE | Admit: 2015-10-18 | Discharge: 2015-10-18 | Disposition: A | Discharge status: Home / Self Care | Payer: Medicaid Other | Source: Ambulatory Visit | Attending: Orthopedic Surgery | Admitting: Orthopedic Surgery

## 2015-10-18 ENCOUNTER — Other Ambulatory Visit: Payer: Self-pay | Admitting: Orthopedic Surgery

## 2015-10-18 DIAGNOSIS — M7989 Other specified soft tissue disorders: Secondary | ICD-10-CM | POA: Insufficient documentation

## 2015-10-18 DIAGNOSIS — M79661 Pain in right lower leg: Secondary | ICD-10-CM

## 2015-10-18 DIAGNOSIS — M79604 Pain in right leg: Secondary | ICD-10-CM | POA: Diagnosis present

## 2015-10-30 ENCOUNTER — Ambulatory Visit: Payer: Self-pay | Admitting: Urology

## 2015-10-30 ENCOUNTER — Encounter: Payer: Self-pay | Admitting: Urology

## 2015-10-30 ENCOUNTER — Encounter: Payer: Self-pay | Admitting: Physical Therapy

## 2015-10-30 ENCOUNTER — Ambulatory Visit: Payer: Medicaid Other | Attending: Orthopedic Surgery | Admitting: Physical Therapy

## 2015-10-30 DIAGNOSIS — M6281 Muscle weakness (generalized): Secondary | ICD-10-CM | POA: Diagnosis present

## 2015-10-30 DIAGNOSIS — M25561 Pain in right knee: Secondary | ICD-10-CM | POA: Insufficient documentation

## 2015-10-30 DIAGNOSIS — R262 Difficulty in walking, not elsewhere classified: Secondary | ICD-10-CM | POA: Diagnosis present

## 2015-10-30 NOTE — Therapy (Signed)
[place], [place], [address] Phone: [phone]   Fax:  [phone]  Physical Therapy Evaluation  Patient Details  Name: Candice Watkins MRN: [id number] Date of Birth: 09/28/1970 Referring Provider: Kathryne Hitch, MD  Encounter Date: 10/30/2015      PT End of Session - 10/30/15 1810    Visit Number 1   Date for PT Re-Evaluation 12/08/15   Authorization Type medicaid- waiting for auth   PT Start Time [id number]   PT Stop Time 1640   PT Time Calculation (min) 55 min   Activity Tolerance Patient limited by pain   Behavior During Therapy [facility] for tasks assessed/performed      Past Medical History  Diagnosis Date  . Hypertension   . Asthma   . Hypothyroidism   . Depression   . Anxiety   . Kidney stones   . PONV (postoperative nausea and vomiting) X 1  . Migraine     "monthly" (10/11/2015)  . Arthritis     "knees, ankles" (10/11/2015)    Past Surgical History  Procedure Laterality Date  . Thyroid surgery      "removed tumor"  . Knee arthroscopy Right   . Cesarean section  2000  . Joint replacement    . Total knee arthroplasty Right 10/11/2015  . Breast biopsy Bilateral     neg- core  . Vaginal hysterectomy    . Tubal ligation    . Total knee arthroplasty Right 10/11/2015    Procedure: TOTAL KNEE ARTHROPLASTY;  Surgeon: Ninetta Lights, MD;  Location: [place];  Service: Orthopedics;  Laterality: Right;    There were no vitals filed for this visit.       Subjective Assessment - 10/30/15 1550    Subjective Pain is worse at night. Doing exercises at home (squats, LAQ, DF, extension, abduction). Reports R ankle pain since waking from surgery.    How long can you stand comfortably? 10 min   How long can you walk comfortably? 15 min   Patient Stated Goals "most mobility I can get"  walking normally, ROM   Currently in Pain? Yes   Pain Score 4    Pain Location Knee   Pain Orientation Right    Pain Descriptors / Indicators Aching   Pain Type Surgical pain   Pain Radiating Towards R ankle pain   Aggravating Factors  standing, walking, bending   Pain Relieving Factors ice            [facility] PT Assessment - 10/30/15 0001    Assessment   Medical Diagnosis RTKA   Referring Provider Kathryne Hitch, MD   Onset Date/Surgical Date 10/11/15   Hand Dominance Right   Next MD Visit approx 1st week of july   Prior Therapy home health 3 visits   Precautions   Precautions Fall;Knee   Restrictions   Weight Bearing Restrictions No   Balance Screen   Has the patient fallen in the past 6 months Yes   How many times? 2  surgical knee (prior to TKA) "gave out"   [place] residence   Available Help at Discharge Family   Type of [place] to enter   Entrance Stairs-Number of Steps 10   Prior Function   Level of Independence Independent   Cognition   Overall Cognitive Status Within Functional Limits for tasks assessed   Observation/Other Assessments   Focus on Therapeutic Outcomes (  FOTO)  45% disability   ROM / Strength   AROM / PROM / Strength AROM;Strength;PROM   AROM   AROM Assessment Site Knee   Right/Left Knee Right   Right Knee Extension -10   PROM   PROM Assessment Site Knee   Right/Left Knee Right   Right Knee Extension -8   Right Knee Flexion 70   Strength   Strength Assessment Site Knee   Right/Left Knee Right   Right Knee Flexion 3+/5   Right Knee Extension 3-/5   Palpation   [place] mobility [facility]   Ambulation/Gait   Assistive device Straight cane   Gait Pattern Antalgic   Gait Comments lacking heel toe pattern and hip extension                   [facility] Adult PT Treatment/Exercise - 10/30/15 0001    Exercises   Exercises Knee/Hip   Knee/Hip Exercises: Stretches   Hip Flexor Stretch 2 reps;30 seconds   Hip Flexor Stretch Limitations supine off EOB   Knee/Hip Exercises: Supine   Quad  Sets 20 reps   Heel Slides 20 reps   Heel Prop for Knee Extension 3 minutes   Modalities   Modalities Vasopneumatic   Vasopneumatic   Number Minutes Vasopneumatic  15 minutes   Vasopnuematic Location  Knee   Vasopneumatic Pressure Low   Vasopneumatic Temperature  coldest   Manual Therapy   Manual therapy comments R illiopsoas release                PT Education - 10/30/15 1758    Education provided Yes   Education Details anatomy of condition, POC, HEP, tennis ball for illiopsoas release, ankle pain management   Person(s) Educated Patient   Methods Explanation;Demonstration;Tactile cues;Verbal cues;Handout   Comprehension Verbalized understanding;Returned demonstration;Verbal cues required;Tactile cues required;Need further instruction             PT Long Term Goals - 10/30/15 1803    PT LONG TERM GOAL #1   Title Knee ROM 0-120 deg to accomodate for functional movements in daily activities by 7/21   Baseline see objective   Time 6   Period Weeks   Status New   PT LONG TERM GOAL #2   Title Pt will be able to independently climb stairs to her apartment with appropriate step-over-step pattern and pain <=2/10   Baseline unable to lead with RLE   Time 6   Period Weeks   Status New   PT LONG TERM GOAL #3   Title Pt will demonstrate 10 SLR without extension lag to indicate appropraite quad strength to provide support to knee joint   Baseline unable at IE   Time 6   Period Weeks   Status New   PT LONG TERM GOAL #4   Title Pt will be independent with HEP and perform with appropriate form at discharge in oder to continue strengthening following PT.    Baseline in process of creating and advancing HEP   Time 6   Period Weeks   Status New               Plan - 10/30/15 1759    Clinical Impression Statement Pt demonstrates limited strength and ROM as expected following TKA. Pt complained of pain in illiopsoas as well as lateral R ankle. Ankle was TTP at  inferior lateral maleolus and was painful with walking, excessive swelling was pooled around the area as well. Pt was educated in wearing appropraite  shoes, elevating and using ice. Pt will continue to benefit from skilled PT in order to safely and appropriately return ROM and strength to funtional levels.    Rehab Potential Good   PT Frequency 2x / week   PT Duration 6 weeks   PT Treatment/Interventions ADLs/Self Care Home Management;Electrical Stimulation;Cryotherapy;Ultrasound;Moist Heat;Iontophoresis 4mg /ml Dexamethasone;Gait training;Stair training;Functional mobility training;Therapeutic activities;Therapeutic exercise;Balance training;Patient/family education;Neuromuscular re-education;Manual techniques;Compression bandaging;Vasopneumatic Device;Taping;Dry needling;Passive range of motion   PT Next Visit Plan ROM, hip and knee strength   PT Home Exercise Plan see pt instructions   Consulted and Agree with Plan of Care Patient      Patient will benefit from skilled therapeutic intervention in order to improve the following deficits and impairments:  Abnormal gait, Pain, Decreased range of motion, Increased edema, Difficulty walking  Visit Diagnosis: Pain in right knee - Plan: PT plan of care cert/re-cert  Difficulty in walking, not elsewhere classified - Plan: PT plan of care cert/re-cert  Muscle weakness (generalized) - Plan: PT plan of care cert/re-cert     Problem List Patient Active Problem List   Diagnosis Date Noted  . S/P total knee replacement 10/11/2015   Emeri Estill C. Arsh Feutz PT, DPT 10/30/2015 6:12 PM   [place], [place], [address] Phone: [phone]   Fax:  [phone]  Name: Candice Watkins MRN: [id number] Date of Birth: 31-Oct-1970

## 2015-11-07 ENCOUNTER — Ambulatory Visit: Payer: Medicaid Other | Admitting: Physical Therapy

## 2015-11-23 ENCOUNTER — Emergency Department (HOSPITAL_BASED_OUTPATIENT_CLINIC_OR_DEPARTMENT_OTHER)
Admit: 2015-11-23 | Discharge: 2015-11-23 | Disposition: A | Discharge status: Home / Self Care | Payer: Medicaid Other | Attending: Emergency Medicine | Admitting: Emergency Medicine

## 2015-11-23 ENCOUNTER — Emergency Department (HOSPITAL_COMMUNITY): Payer: Medicaid Other

## 2015-11-23 ENCOUNTER — Emergency Department (HOSPITAL_COMMUNITY)
Admission: EM | Admit: 2015-11-23 | Discharge: 2015-11-23 | Disposition: A | Discharge status: Home / Self Care | Payer: Medicaid Other | Attending: Emergency Medicine | Admitting: Emergency Medicine

## 2015-11-23 ENCOUNTER — Encounter (HOSPITAL_COMMUNITY): Payer: Self-pay

## 2015-11-23 DIAGNOSIS — E039 Hypothyroidism, unspecified: Secondary | ICD-10-CM | POA: Diagnosis not present

## 2015-11-23 DIAGNOSIS — M79609 Pain in unspecified limb: Secondary | ICD-10-CM

## 2015-11-23 DIAGNOSIS — Z96651 Presence of right artificial knee joint: Secondary | ICD-10-CM | POA: Diagnosis not present

## 2015-11-23 DIAGNOSIS — R112 Nausea with vomiting, unspecified: Secondary | ICD-10-CM | POA: Insufficient documentation

## 2015-11-23 DIAGNOSIS — J45909 Unspecified asthma, uncomplicated: Secondary | ICD-10-CM | POA: Diagnosis not present

## 2015-11-23 DIAGNOSIS — Z7901 Long term (current) use of anticoagulants: Secondary | ICD-10-CM | POA: Diagnosis not present

## 2015-11-23 DIAGNOSIS — I10 Essential (primary) hypertension: Secondary | ICD-10-CM | POA: Insufficient documentation

## 2015-11-23 DIAGNOSIS — R600 Localized edema: Secondary | ICD-10-CM | POA: Insufficient documentation

## 2015-11-23 DIAGNOSIS — R609 Edema, unspecified: Secondary | ICD-10-CM | POA: Diagnosis not present

## 2015-11-23 DIAGNOSIS — Z79899 Other long term (current) drug therapy: Secondary | ICD-10-CM | POA: Insufficient documentation

## 2015-11-23 LAB — COMPREHENSIVE METABOLIC PANEL
ALBUMIN: 3.6 g/dL (ref 3.5–5.0)
ALT: 13 U/L — ABNORMAL LOW (ref 14–54)
ANION GAP: 5 (ref 5–15)
AST: 18 U/L (ref 15–41)
Alkaline Phosphatase: 45 U/L (ref 38–126)
BUN: <5 mg/dL — ABNORMAL LOW (ref 6–20)
CHLORIDE: 107 mmol/L (ref 101–111)
CO2: 25 mmol/L (ref 22–32)
Calcium: 9 mg/dL (ref 8.9–10.3)
Creatinine, Ser: 0.63 mg/dL (ref 0.44–1.00)
GFR calc non Af Amer: >60 mL/min (ref >60)
Glucose, Bld: 99 mg/dL (ref 65–99)
POTASSIUM: 3.3 mmol/L — AB (ref 3.5–5.1)
SODIUM: 137 mmol/L (ref 135–145)
Total Bilirubin: 0.6 mg/dL (ref 0.3–1.2)
Total Protein: 7.1 g/dL (ref 6.5–8.1)

## 2015-11-23 LAB — CBC WITH DIFFERENTIAL/PLATELET
BASOS PCT: 0 %
Basophils Absolute: 0 10*3/uL (ref 0.0–0.1)
EOS ABS: 0.2 10*3/uL (ref 0.0–0.7)
Eosinophils Relative: 4 %
HCT: 33 % — ABNORMAL LOW (ref 36.0–46.0)
Hemoglobin: 10.8 g/dL — ABNORMAL LOW (ref 12.0–15.0)
Lymphocytes Relative: 25 %
Lymphs Abs: 1.5 10*3/uL (ref 0.7–4.0)
MCH: 28.2 pg (ref 26.0–34.0)
MCHC: 32.7 g/dL (ref 30.0–36.0)
MCV: 86.2 fL (ref 78.0–100.0)
MONO ABS: 0.3 10*3/uL (ref 0.1–1.0)
MONOS PCT: 5 %
Neutro Abs: 4.1 10*3/uL (ref 1.7–7.7)
Neutrophils Relative %: 66 %
PLATELETS: 253 10*3/uL (ref 150–400)
RBC: 3.83 MIL/uL — ABNORMAL LOW (ref 3.87–5.11)
RDW: 14.9 % (ref 11.5–15.5)
WBC: 6.1 10*3/uL (ref 4.0–10.5)

## 2015-11-23 LAB — LIPASE, BLOOD: Lipase: 18 U/L (ref 11–51)

## 2015-11-23 LAB — URINALYSIS, ROUTINE W REFLEX MICROSCOPIC
BILIRUBIN URINE: NEGATIVE
Glucose, UA: NEGATIVE mg/dL
KETONES UR: NEGATIVE mg/dL
Leukocytes, UA: NEGATIVE
NITRITE: NEGATIVE
Protein, ur: NEGATIVE mg/dL
Specific Gravity, Urine: 1.005 (ref 1.005–1.030)
pH: 7 (ref 5.0–8.0)

## 2015-11-23 LAB — URINE MICROSCOPIC-ADD ON

## 2015-11-23 LAB — PROTIME-INR
INR: 1.05 (ref 0.00–1.49)
PROTHROMBIN TIME: 13.9 s (ref 11.6–15.2)

## 2015-11-23 LAB — APTT: APTT: 22 s — AB (ref 24–37)

## 2015-11-23 MED ORDER — ONDANSETRON HCL 4 MG/2ML IJ SOLN
4.0000 mg | Freq: Once | INTRAMUSCULAR | Status: AC
  Administered 2015-11-23: 4 mg via INTRAVENOUS
  Filled 2015-11-23: qty 2

## 2015-11-23 MED ORDER — SENNOSIDES-DOCUSATE SODIUM 8.6-50 MG PO TABS: ORAL_TABLET | ORAL | Status: DC

## 2015-11-23 MED ORDER — IOPAMIDOL (ISOVUE-370) INJECTION 76%
INTRAVENOUS | Status: AC
  Administered 2015-11-23: 80 mL
  Filled 2015-11-23: qty 100

## 2015-11-23 MED ORDER — LEVOFLOXACIN IN D5W 750 MG/150ML IV SOLN: 750.0000 mg | Freq: Once | INTRAVENOUS | Status: DC

## 2015-11-23 MED ORDER — ONDANSETRON HCL 4 MG PO TABS: 4.0000 mg | ORAL_TABLET | Freq: Three times a day (TID) | ORAL | Status: DC | PRN

## 2015-11-23 MED ORDER — POTASSIUM CHLORIDE CRYS ER 20 MEQ PO TBCR: 20.0000 meq | EXTENDED_RELEASE_TABLET | Freq: Every day | ORAL | Status: DC

## 2015-11-23 MED ORDER — POTASSIUM CHLORIDE CRYS ER 20 MEQ PO TBCR
20.0000 meq | EXTENDED_RELEASE_TABLET | Freq: Once | ORAL | Status: AC
  Administered 2015-11-23: 20 meq via ORAL
  Filled 2015-11-23: qty 1

## 2015-11-23 MED ORDER — IOPAMIDOL (ISOVUE-370) INJECTION 76%
INTRAVENOUS | Status: AC
  Filled 2015-11-23: qty 100

## 2015-11-23 MED ORDER — MORPHINE SULFATE (PF) 4 MG/ML IV SOLN
4.0000 mg | Freq: Once | INTRAVENOUS | Status: AC
  Administered 2015-11-23: 4 mg via INTRAVENOUS
  Filled 2015-11-23: qty 1

## 2015-11-23 MED ORDER — SODIUM CHLORIDE 0.9 % IV BOLUS (SEPSIS)
1000.0000 mL | Freq: Once | INTRAVENOUS | Status: AC
  Administered 2015-11-23: 1000 mL via INTRAVENOUS

## 2015-11-23 MED ORDER — SODIUM CHLORIDE 0.9 % IV BOLUS (SEPSIS): 500.0000 mL | Freq: Once | INTRAVENOUS | Status: DC

## 2015-11-23 NOTE — ED Notes (Addendum)
Pt presents with pain, swelling since R knee surgery in May.  Pt reports pain and swelling is generalized but has been worse in R calf and ankle- denies injury.  Pt reports nausea and vomiting x 5 days, denies any abdominal pain.  Last bowel movement x 4 days.  Pt also reports shortness of breath that has worsened today.

## 2015-11-23 NOTE — Discharge Instructions (Signed)
Please follow with your primary care doctor in the next 2 days for a check-up. They must obtain records for further management.   Do not hesitate to return to the Emergency Department for any new, worsening or concerning symptoms.    Nausea and Vomiting Nausea is a sick feeling that often comes before throwing up (vomiting). Vomiting is a reflex where stomach contents come out of your mouth. Vomiting can cause severe loss of body fluids (dehydration). Children and elderly adults can become dehydrated quickly, especially if they also have diarrhea. Nausea and vomiting are symptoms of a condition or disease. It is important to find the cause of your symptoms. CAUSES   Direct irritation of the stomach lining. This irritation can result from increased acid production (gastroesophageal reflux disease), infection, food poisoning, taking certain medicines (such as nonsteroidal anti-inflammatory drugs), alcohol use, or tobacco use.  Signals from the brain.These signals could be caused by a headache, heat exposure, an inner ear disturbance, increased pressure in the brain from injury, infection, a tumor, or a concussion, pain, emotional stimulus, or metabolic problems.  An obstruction in the gastrointestinal tract (bowel obstruction).  Illnesses such as diabetes, hepatitis, gallbladder problems, appendicitis, kidney problems, cancer, sepsis, atypical symptoms of a heart attack, or eating disorders.  Medical treatments such as chemotherapy and radiation.  Receiving medicine that makes you sleep (general anesthetic) during surgery. DIAGNOSIS Your caregiver may ask for tests to be done if the problems do not improve after a few days. Tests may also be done if symptoms are severe or if the reason for the nausea and vomiting is not clear. Tests may include:  Urine tests.  Blood tests.  Stool tests.  Cultures (to look for evidence of infection).  X-rays or other imaging studies. Test results can  help your caregiver make decisions about treatment or the need for additional tests. TREATMENT You need to stay well hydrated. Drink frequently but in small amounts.You may wish to drink water, sports drinks, clear broth, or eat frozen ice pops or gelatin dessert to help stay hydrated.When you eat, eating slowly may help prevent nausea.There are also some antinausea medicines that may help prevent nausea. HOME CARE INSTRUCTIONS   Take all medicine as directed by your caregiver.  If you do not have an appetite, do not force yourself to eat. However, you must continue to drink fluids.  If you have an appetite, eat a normal diet unless your caregiver tells you differently.  Eat a variety of complex carbohydrates (rice, wheat, potatoes, bread), lean meats, yogurt, fruits, and vegetables.  Avoid high-fat foods because they are more difficult to digest.  Drink enough water and fluids to keep your urine clear or pale yellow.  If you are dehydrated, ask your caregiver for specific rehydration instructions. Signs of dehydration may include:  Severe thirst.  Dry lips and mouth.  Dizziness.  Dark urine.  Decreasing urine frequency and amount.  Confusion.  Rapid breathing or pulse. SEEK IMMEDIATE MEDICAL CARE IF:   You have blood or brown flecks (like coffee grounds) in your vomit.  You have black or bloody stools.  You have a severe headache or stiff neck.  You are confused.  You have severe abdominal pain.  You have chest pain or trouble breathing.  You do not urinate at least once every 8 hours.  You develop cold or clammy skin.  You continue to vomit for longer than 24 to 48 hours.  You have a fever. MAKE SURE YOU:  Understand these instructions.  Will watch your condition.  Will get help right away if you are not doing well or get worse.   This information is not intended to replace advice given to you by your health care provider. Make sure you discuss any  questions you have with your health care provider.   Document Released: 05/06/2005 Document Revised: 07/29/2011 Document Reviewed: 10/03/2010 Elsevier Interactive Patient Education [employer].

## 2015-11-23 NOTE — ED Notes (Signed)
Pt currently doing a vascular study. Has a order for a CT Scan. Vascular will take pt to CT when they are done.

## 2015-11-23 NOTE — ED Provider Notes (Signed)
CSN: [id number]     Arrival date & time 11/23/15  [id number] History   First MD Initiated Contact with Patient 11/23/15 [phone]     Chief Complaint  Patient presents with  . Emesis     (Consider location/radiation/quality/duration/timing/severity/associated sxs/prior Treatment) HPI   Blood pressure 172/99, pulse 81, temperature 98.7 F (37.1 C), temperature source Oral, resp. rate 18, height 5\' 4"  (1.626 m), weight 114.306 kg, SpO2 100 %.  Jaycee P Yokota is a 45 y.o. female with past medical history significant for hypertension, asthma, obesity status post right knee total replacement 3 weeks ago by Percell Miller, stopped taking her Eliquis 8 days ago complaining of increasing right lower extremity edema and pain over the last several days with associated shortness of breath onset this morning. Patient denies palpitations, fever, cough, hemoptysis. She also notes multiple episodes of nonbloody, nonbilious, non-coffee ground emesis over the last 5 days with no abdominal pain. Patient endorses constipation which she thinks is likely secondary to the narcotic pain medication. Denies any change in urination. Patient has been taking fiber supplements. She's not been able to go to physical therapy due to issues with her insurance.  Past Medical History  Diagnosis Date  . Hypertension   . Asthma   . Hypothyroidism   . Depression   . Anxiety   . Kidney stones   . PONV (postoperative nausea and vomiting) X 1  . Migraine     "monthly" (10/11/2015)  . Arthritis     "knees, ankles" (10/11/2015)   Past Surgical History  Procedure Laterality Date  . Thyroid surgery      "removed tumor"  . Knee arthroscopy Right   . Cesarean section  2000  . Joint replacement    . Total knee arthroplasty Right 10/11/2015  . Breast biopsy Bilateral     neg- core  . Vaginal hysterectomy    . Tubal ligation    . Total knee arthroplasty Right 10/11/2015    Procedure: TOTAL KNEE ARTHROPLASTY;  Surgeon: Ninetta Lights, MD;   Location: [facility];  Service: Orthopedics;  Laterality: Right;   Family History  Problem Relation Age of Onset  . Breast cancer Neg Hx    Social History  Substance Use Topics  . Smoking status: Never Smoker   . Smokeless tobacco: Never Used  . Alcohol Use: 1.2 oz/week    1 Glasses of wine, 1 Shots of liquor per week   OB History    No data available     Review of Systems  10 systems reviewed and found to be negative, except as noted in the HPI.   Allergies  Metronidazole; Phenyltoloxamine-acetaminophen; Ibuprofen; Other; and Septra  Home Medications   Prior to Admission medications   Medication Sig Start Date End Date Taking? Authorizing Provider  ALPRAZolam Duanne Moron) 1 MG tablet Take 1 mg by mouth daily. 09/12/15  Yes Historical Provider, MD  budesonide-formoterol (SYMBICORT) 80-4.5 MCG/ACT inhaler Inhale 1 puff into the lungs 2 (two) times daily. Reported on 10/30/2015   Yes Historical Provider, MD  cetirizine (ZYRTEC) 10 MG tablet Take 10 mg by mouth daily as needed for allergies.   Yes Historical Provider, MD  levothyroxine (SYNTHROID, LEVOTHROID) 100 MCG tablet Take 100 mcg by mouth daily. 09/12/15  Yes Historical Provider, MD  lisinopril (PRINIVIL,ZESTRIL) 40 MG tablet Take 40 mg by mouth daily.   Yes Historical Provider, MD  methocarbamol (ROBAXIN) 500 MG tablet Take 1 tablet (500 mg total) by mouth 4 (four) times daily. 10/11/15  Yes  Aundra Dubin, PA-C  oxyCODONE-acetaminophen (ROXICET) 5-325 MG tablet Take 1-2 tablets by mouth every 4 (four) hours as needed. 10/11/15  Yes Aundra Dubin, PA-C  simvastatin (ZOCOR) 20 MG tablet Take 20 mg by mouth daily. 09/12/15  Yes Historical Provider, MD  apixaban (ELIQUIS) 2.5 MG TABS tablet Take 1 tab po q12 hours x 3 weeks following surgery to prevent blood clots Patient not taking: Reported on 10/30/2015 10/11/15   Aundra Dubin, PA-C  lisinopril-hydrochlorothiazide (PRINZIDE,ZESTORETIC) 10-12.5 MG tablet Take 1 tablet by mouth daily.  08/13/15   Historical Provider, MD  ondansetron (ZOFRAN) 4 MG tablet Take 1 tablet (4 mg total) by mouth every 8 (eight) hours as needed for nausea or vomiting. 11/23/15   Jinger Middlesworth, PA-C  potassium chloride SA (K-DUR,KLOR-CON) 20 MEQ tablet Take 1 tablet (20 mEq total) by mouth daily. 11/23/15   Iantha Titsworth, PA-C  senna-docusate (SENOKOT-S) 8.6-50 MG tablet Start 1 tab QHS, up to 2tabs BID. Max 4 tabs daily for constipation 11/23/15   Elanor Cale, PA-C  TRINTELLIX 10 MG TABS Take 1 tablet by mouth daily. 06/27/15   Historical Provider, MD   BP 146/76 mmHg  Pulse 63  Temp(Src) 99 F (37.2 C) (Oral)  Resp 18  Ht 5\' 4"  (1.626 m)  Wt 114.306 kg  BMI 43.23 kg/m2  SpO2 100% Physical Exam  Constitutional: She is oriented to person, place, and time. She appears well-developed and well-nourished. No distress.  HENT:  Head: Normocephalic and atraumatic.  Mouth/Throat: Oropharynx is clear and moist.  Eyes: Conjunctivae and EOM are normal. Pupils are equal, round, and reactive to light.  Neck: Normal range of motion.  Cardiovascular: Normal rate, regular rhythm and intact distal pulses.   Pulmonary/Chest: Effort normal and breath sounds normal.  Abdominal: Soft. Bowel sounds are normal. She exhibits no distension and no mass. There is no tenderness. There is no rebound and no guarding.  Normal active bowel sounds, no tenderness to deep palpation of any quadrant, no guarding or rebound.  Musculoskeletal: Normal range of motion. She exhibits edema and tenderness.  Well-healing surgical scar to right knee, no significant warmth, good range of motion to the knee, she is distally neurovascular intact, there is edema with calf asymmetry, patient is tender to palpation along the right calf.  Neurological: She is alert and oriented to person, place, and time.  Skin: She is not diaphoretic.  Psychiatric: She has a normal mood and affect.  Nursing note and vitals reviewed.   ED Course   Procedures (including critical care time) Labs Review Labs Reviewed  CBC WITH DIFFERENTIAL/PLATELET - Abnormal; Notable for the following:    RBC 3.83 (*)    Hemoglobin 10.8 (*)    HCT 33.0 (*)    All other components within normal limits  COMPREHENSIVE METABOLIC PANEL - Abnormal; Notable for the following:    Potassium 3.3 (*)    BUN <5 (*)    ALT 13 (*)    All other components within normal limits  APTT - Abnormal; Notable for the following:    aPTT 22 (*)    All other components within normal limits  URINALYSIS, ROUTINE W REFLEX MICROSCOPIC (NOT AT [facility]) - Abnormal; Notable for the following:    Hgb urine dipstick TRACE (*)    All other components within normal limits  URINE MICROSCOPIC-ADD ON - Abnormal; Notable for the following:    Squamous Epithelial / LPF 6-30 (*)    Bacteria, UA RARE (*)  All other components within normal limits  LIPASE, BLOOD  PROTIME-INR    Imaging Review Ct Angio Chest Pe W/cm &/or Wo Cm  11/23/2015  CLINICAL DATA:  Shortness of breath, intermittent lower leg swelling starting May, history of knee replacement EXAM: CT ANGIOGRAPHY CHEST WITH CONTRAST TECHNIQUE: Multidetector CT imaging of the chest was performed using the standard protocol during bolus administration of intravenous contrast. Multiplanar CT image reconstructions and MIPs were obtained to evaluate the vascular anatomy. CONTRAST:  80 cc Isovue COMPARISON:  CT scan of the chest 10/02/2009 FINDINGS: Cardiovascular: Cardiac size is stable.  No pericardial effusion. No aortic aneurysm.  No pulmonary embolus is noted. No mediastinal hematoma. Mediastinum/Nodes: No hilar or mediastinal adenopathy. Central airways are patent. Lungs/Pleura: Images of the lung parenchyma shows no acute infiltrate or pulmonary edema. Mild atelectasis noted in left lower lobe posteriorly. No focal consolidation. No pneumothorax. Upper Abdomen: Visualized upper abdomen shows no adrenal gland mass. Visualized upper liver,  spleen and pancreas is unremarkable. Musculoskeletal: No destructive bony lesions are noted. Again noted degenerative changes upper and mid thoracic spine. Small bilateral axillary lymph nodes are noted not pathologic by size criteria. Review of the MIP images confirms the above findings. IMPRESSION: 1. No pulmonary embolus is noted. 2. No acute infiltrate or pulmonary edema. 3. No mediastinal hematoma or adenopathy. 4. Degenerative changes thoracic spine. Electronically Signed   By: Lahoma Crocker M.D.   On: 11/23/2015 10:41   Dg Abd Acute W/chest  11/23/2015  CLINICAL DATA:  Leg swelling.  Nausea. EXAM: DG ABDOMEN ACUTE W/ 1V CHEST COMPARISON:  12/12/2008 . FINDINGS: Mediastinum hilar structures normal. Cardiomegaly with normal pulmonary vascularity. Soft tissue structures the abdomen are unremarkable. Nonspecific air-filled loops of small and large bowel are noted. Stool noted throughout colon no free air. IMPRESSION: 1.  Cardiomegaly, no pulmonary venous congestion. 2. Nondilated air-filled loops of small and large bowel are noted. Follow-up exam can be obtained to exclude developing bowel distention. Stool noted throughout the colon. Electronically Signed   By: Marcello Moores  Register   On: 11/23/2015 08:44   I have personally reviewed and evaluated these images and lab results as part of my medical decision-making.   EKG Interpretation None      MDM   Final diagnoses:  Edema of right lower extremity  Non-intractable vomiting with nausea, vomiting of unspecified type    Filed Vitals:   11/23/15 0929 11/23/15 1055 11/23/15 1154 11/23/15 1155  BP:   146/76 146/76  Pulse: 70 71 71 63  Temp:   99 F (37.2 C)   TempSrc:   Oral   Resp:   18   Height:      Weight:      SpO2: 100% 100% 99% 100%    Medications  iopamidol (ISOVUE-370) 76 % injection (not administered)  sodium chloride 0.9 % bolus 1,000 mL (0 mLs Intravenous Stopped 11/23/15 1058)  ondansetron (ZOFRAN) injection 4 mg (4 mg Intravenous  Given 11/23/15 0823)  morphine 4 MG/ML injection 4 mg (4 mg Intravenous Given 11/23/15 0823)  iopamidol (ISOVUE-370) 76 % injection (80 mLs  Contrast Given 11/23/15 1024)  potassium chloride SA (K-DUR,KLOR-CON) CR tablet 20 mEq (20 mEq Oral Given 11/23/15 1130)    Ashaunte P Agent is 45 y.o. female presenting withRight lower extremity pain and edema, she status post total knee replacement approximately 3 weeks ago, she stopped taking her prophylactic Eliquis 8 days ago. She also reports a shortness of breath without palpitations. Physical exam does show a  swelling to the right lower extremity. Will need rule out for DVT/PE. Patient has had multiple episodes of emesis with no abdominal pain, abdominal exam is benign, patient is overall well appearing, overall well hydrated. I think that this may be secondary to  slow transit from reduced activity in addition to the narcotics, she is taking a fiber supplement without a true stool softener. Acute abdominal series shows a large stool burden with nondilated air-filled loops of small and large intestine. Repeat abdominal exam remains benign, patient is tolerating by mouth's, will write her prescription for Senokot and including Zofran. Encouraged her to follow closely with primary care physician  Evaluation does not show pathology that would require ongoing emergent intervention or inpatient treatment. Pt is hemodynamically stable and mentating appropriately. Discussed findings and plan with patient/guardian, who agrees with care plan. All questions answered. Return precautions discussed and outpatient follow up given.   Discharge Medication List as of 11/23/2015 12:16 PM    START taking these medications   Details  ondansetron (ZOFRAN) 4 MG tablet Take 1 tablet (4 mg total) by mouth every 8 (eight) hours as needed for nausea or vomiting., Starting 11/23/2015, Until Discontinued, Print    potassium chloride SA (K-DUR,KLOR-CON) 20 MEQ tablet Take 1 tablet (20 mEq  total) by mouth daily., Starting 11/23/2015, Until Discontinued, Print    senna-docusate (SENOKOT-S) 8.6-50 MG tablet Start 1 tab QHS, up to 2tabs BID. Max 4 tabs daily for constipation, Print             Monico Blitz, PA-C 11/23/15 1313  Ripley Fraise, MD 11/23/15 1538

## 2015-11-23 NOTE — Progress Notes (Signed)
VASCULAR LAB PRELIMINARY  PRELIMINARY  PRELIMINARY  PRELIMINARY  Right lower extremity venous duplex completed.    Preliminary report:  Right:  No evidence of DVT, superficial thrombosis, or Baker's cyst.  Kalei Mckillop, RVS 11/23/2015, 10:26 AM

## 2015-12-08 ENCOUNTER — Ambulatory Visit (INDEPENDENT_AMBULATORY_CARE_PROVIDER_SITE_OTHER): Payer: Medicaid Other | Admitting: Urology

## 2015-12-08 ENCOUNTER — Encounter: Payer: Self-pay | Admitting: Urology

## 2015-12-08 VITALS — BP 129/87 | HR 87 | Ht 64.0 in | Wt 240.7 lb

## 2015-12-08 DIAGNOSIS — R3129 Other microscopic hematuria: Secondary | ICD-10-CM

## 2015-12-08 LAB — URINALYSIS, COMPLETE
BILIRUBIN UA: POSITIVE — AB
GLUCOSE, UA: NEGATIVE
LEUKOCYTES UA: NEGATIVE
Nitrite, UA: NEGATIVE
SPEC GRAV UA: 1.02 (ref 1.005–1.030)
Urobilinogen, Ur: 1 mg/dL (ref 0.2–1.0)
pH, UA: 6.5 (ref 5.0–7.5)

## 2015-12-08 LAB — MICROSCOPIC EXAMINATION

## 2015-12-08 NOTE — Progress Notes (Signed)
12/08/2015 11:07 AM   Candice Watkins 1970/12/10 [id number]  Referring provider: Nino Glow McLean-Scocozza, MD [place], [place] [address]  Chief Complaint  Patient presents with  . Hematuria    referred by Marzetta Board MD    HPI: Patient is a 45 -year-old [place] American who presents today as a referral from their PCP, Dr.McLean-Scocozza, for microscopic hematuria.  Patient's PCP did not forward any UA's at the time of the patient's visit.  Patient states that every time she goes to the doctor they tell her that she has blood in her urine.   She has not had a formal hematuria workup.   Patient doesn't have a prior history of microscopic hematuria.    She does not have a prior history of recurrent urinary tract infections, nephrolithiasis, trauma to the genitourinary tract or malignancies of the genitourinary tract.   She does have a family medical history of nephrolithiasis with her mother having kidney and bladder stones, but there is no family history of malignancies of the genitourinary tract or hematuria.   Today, she is having symptoms of frequent urination, urgency, nocturia and stress urinary incontinence.   Her UA today demonstrates 3/10 RBC's/hpf.  She is not having symptoms of dysuria, hesitancy, intermittency, straining to urinate or a weak urinary stream.     She is not experiencing any suprapubic pain, abdominal pain or flank pain. She denies any recent fevers, chills, nausea or vomiting.   She has not had any recent imaging studies.   She is not a smoker.   She is exposed to secondhand smoke.  She has worked with [employer].    PMH: Past Medical History  Diagnosis Date  . Hypertension   . Asthma   . Hypothyroidism   . Depression   . Anxiety   . Kidney stones   . PONV (postoperative nausea and vomiting) X 1  . Migraine     "monthly" (10/11/2015)  . Arthritis     "knees, ankles" (10/11/2015)  . Heartburn   . HLD (hyperlipidemia)     . Stomach ulcer     Surgical History: Past Surgical History  Procedure Laterality Date  . Thyroid surgery      "removed tumor"  . Knee arthroscopy Right   . Cesarean section  2000  . Joint replacement    . Total knee arthroplasty Right 10/11/2015  . Breast biopsy Bilateral     neg- core  . Vaginal hysterectomy    . Tubal ligation    . Total knee arthroplasty Right 10/11/2015    Procedure: TOTAL KNEE ARTHROPLASTY;  Surgeon: Ninetta Lights, MD;  Location: [facility];  Service: Orthopedics;  Laterality: Right;    Home Medications:    Medication List       This list is accurate as of: 12/08/15 11:07 AM.  Always use your most recent med list.               ALPRAZolam 1 MG tablet  Commonly known as:  XANAX  Take 1 mg by mouth daily.     apixaban 2.5 MG Tabs tablet  Commonly known as:  ELIQUIS  Take 1 tab po q12 hours x 3 weeks following surgery to prevent blood clots     budesonide-formoterol 80-4.5 MCG/ACT inhaler  Commonly known as:  SYMBICORT  Inhale 1 puff into the lungs 2 (two) times daily. Reported on 12/08/2015     cetirizine 10 MG tablet  Commonly known as:  ZYRTEC  Take 10 mg by mouth daily as needed for allergies.     enalapril-hydrochlorothiazide 10-25 MG tablet  Commonly known as:  VASERETIC  Take by mouth. Reported on 12/08/2015     fluticasone 50 MCG/ACT nasal spray  Commonly known as:  FLONASE     levothyroxine 100 MCG tablet  Commonly known as:  SYNTHROID, LEVOTHROID  Take 100 mcg by mouth daily.     lisinopril 40 MG tablet  Commonly known as:  PRINIVIL,ZESTRIL  Take 40 mg by mouth daily.     lisinopril-hydrochlorothiazide 10-12.5 MG tablet  Commonly known as:  PRINZIDE,ZESTORETIC  Take 1 tablet by mouth daily. Reported on 12/08/2015     methocarbamol 500 MG tablet  Commonly known as:  ROBAXIN  Take 1 tablet (500 mg total) by mouth 4 (four) times daily.     ondansetron 4 MG tablet  Commonly known as:  ZOFRAN  Take 1 tablet (4 mg total) by  mouth every 8 (eight) hours as needed for nausea or vomiting.     oxyCODONE-acetaminophen 5-325 MG tablet  Commonly known as:  ROXICET  Take 1-2 tablets by mouth every 4 (four) hours as needed.     potassium chloride SA 20 MEQ tablet  Commonly known as:  K-DUR,KLOR-CON  Take 1 tablet (20 mEq total) by mouth daily.     senna-docusate 8.6-50 MG tablet  Commonly known as:  Senokot-S  Start 1 tab QHS, up to 2tabs BID. Max 4 tabs daily for constipation     simvastatin 20 MG tablet  Commonly known as:  ZOCOR  Take 20 mg by mouth daily.     TRINTELLIX 10 MG Tabs  Generic drug:  vortioxetine HBr  Take 1 tablet by mouth daily. Reported on 12/08/2015     Vitamin D (Ergocalciferol) 50000 units Caps capsule  Commonly known as:  DRISDOL  Take by mouth. Reported on 12/08/2015        Allergies:  Allergies  Allergen Reactions  . Metronidazole Swelling and Other (See Comments)    EDEMA  . Phenyltoloxamine-Acetaminophen Hives  . Ibuprofen Other (See Comments)    History of Stomach Ulcers  . Other Nausea And Vomiting    Progesic    . Septra [Sulfamethoxazole-Trimethoprim] Nausea And Vomiting    Family History: Family History  Problem Relation Age of Onset  . Breast cancer Neg Hx   . Kidney disease Neg Hx     Social History:  reports that she has never smoked. She has never used smokeless tobacco. She reports that she drinks about 1.2 oz of alcohol per week. She reports that she does not use illicit drugs.  ROS: UROLOGY Frequent Urination?: Yes Hard to postpone urination?: Yes Burning/pain with urination?: No Get up at night to urinate?: Yes Leakage of urine?: Yes Urine stream starts and stops?: No Trouble starting stream?: No Do you have to strain to urinate?: No Blood in urine?: Yes Urinary tract infection?: Yes Sexually transmitted disease?: No Injury to kidneys or bladder?: No Painful intercourse?: No Weak stream?: No Currently pregnant?: No Vaginal bleeding?:  No Last menstrual period?: n  Gastrointestinal Nausea?: Yes Vomiting?: Yes Indigestion/heartburn?: No Diarrhea?: No Constipation?: Yes  Constitutional Fever: No Night sweats?: No Weight loss?: No Fatigue?: Yes  Skin Skin rash/lesions?: No Itching?: No  Eyes Blurred vision?: No Double vision?: No  Ears/Nose/Throat Sore throat?: No Sinus problems?: Yes  Hematologic/Lymphatic Swollen glands?: No Easy bruising?: Yes  Cardiovascular Leg swelling?: Yes Chest pain?: No  Respiratory Cough?: No Shortness of breath?:  Yes  Endocrine Excessive thirst?: No  Musculoskeletal Back pain?: No Joint pain?: Yes  Neurological Headaches?: No Dizziness?: No  Psychologic Depression?: Yes Anxiety?: Yes  Physical Exam: BP 129/87 mmHg  Pulse 87  Ht 5\' 4"  (1.626 m)  Wt 240 lb 11.2 oz (109.181 kg)  BMI 41.30 kg/m2  Constitutional: Well nourished. Alert and oriented, No acute distress. HEENT: [place] AT, moist mucus membranes. Trachea midline, no masses. Cardiovascular: No clubbing, cyanosis, or edema. Respiratory: Normal respiratory effort, no increased work of breathing. GI: Abdomen is soft, non tender, non distended, no abdominal masses. Liver and spleen not palpable.  No hernias appreciated.  Stool sample for occult testing is not indicated.   GU: No CVA tenderness.  No bladder fullness or masses.   Skin: No rashes, bruises or suspicious lesions. Lymph: No cervical or inguinal adenopathy. Neurologic: Grossly intact, no focal deficits, moving all 4 extremities. Psychiatric: Normal mood and affect.  Laboratory Data: Lab Results  Component Value Date   WBC 6.1 11/23/2015   HGB 10.8* 11/23/2015   HCT 33.0* 11/23/2015   MCV 86.2 11/23/2015   PLT 253 11/23/2015    Lab Results  Component Value Date   CREATININE 0.63 11/23/2015    Lab Results  Component Value Date   AST 18 11/23/2015   Lab Results  Component Value Date   ALT 13* 11/23/2015      Urinalysis Results for orders placed or performed in visit on 12/08/15  Microscopic Examination  Result Value Ref Range   WBC, UA 0-5 0 -  5 /hpf   RBC, UA 3-10 (A) 0 -  2 /hpf   Epithelial Cells (non renal) 0-10 0 - 10 /hpf   Mucus, UA Present (A) Not Estab.   Bacteria, UA Few None seen/Few  Urinalysis, Complete  Result Value Ref Range   Specific Gravity, UA 1.020 1.005 - 1.030   pH, UA 6.5 5.0 - 7.5   Color, UA Yellow Yellow   Appearance Ur Clear Clear   Leukocytes, UA Negative Negative   Protein, UA 1+ (A) Negative/Trace   Glucose, UA Negative Negative   Ketones, UA Trace (A) Negative   RBC, UA 2+ (A) Negative   Bilirubin, UA Positive (A) Negative   Urobilinogen, Ur 1.0 0.2 - 1.0 mg/dL   Nitrite, UA Negative Negative   Microscopic Examination See below:   BUN+Creat  Result Value Ref Range   BUN 7 6 - 24 mg/dL   Creatinine, Ser 0.61 0.57 - 1.00 mg/dL   GFR calc non Af Amer 111 >59 mL/min/1.73   GFR calc Af Amer 128 >59 mL/min/1.73   BUN/Creatinine Ratio 11 9 - 23     Assessment & Plan:    1. Microscopic hematuria:    I explained to the patient that there are a number of causes that can be associated with blood in the urine, such as stones, UTI's, damage to the urinary tract and/or cancer.  At this time, I felt that the patient warranted further urologic evaluation.   The AUA guidelines state that a CT urogram is the preferred imaging study to evaluate hematuria.  I explained to the patient that a contrast material will be injected into a vein and that in rare instances, an allergic reaction can result and may even life threatening   The patient denies any allergies to contrast, iodine and/or seafood and is not taking metformin.  Her reproductive status is status post hysterectomy.    Following the imaging study,  I've recommended  a cystoscopy. I described how this is performed, typically in an office setting with a flexible cystoscope. We described the risks,  benefits, and possible side effects, the most common of which is a minor amount of blood in the urine and/or burning which usually resolves in 24 to 48 hours.    The patient had the opportunity to ask questions which were answered. Based upon this discussion, the patient is willing to proceed. Therefore, I've ordered: a CT Urogram and cystoscopy.  She will return following all of the above for discussion of the results.     - Urinalysis, Complete - BUN+Creat   Return for CT Urogram and cystoscopy for hematuria.  These notes generated with voice recognition software. I apologize for typographical errors.  Zara Council, [facility] [address] [place], [place] [address] [phone]

## 2015-12-09 LAB — BUN+CREAT
BUN/Creatinine Ratio: 11 (ref 9–23)
BUN: 7 mg/dL (ref 6–24)
Creatinine, Ser: 0.61 mg/dL (ref 0.57–1.00)
GFR calc non Af Amer: 111 mL/min/{1.73_m2} (ref >59)
GFR, EST AFRICAN AMERICAN: 128 mL/min/{1.73_m2} (ref >59)

## 2015-12-19 ENCOUNTER — Ambulatory Visit
Admission: RE | Admit: 2015-12-19 | Discharge: 2015-12-19 | Disposition: A | Discharge status: Home / Self Care | Payer: Medicaid Other | Source: Ambulatory Visit | Attending: Urology | Admitting: Urology

## 2015-12-19 DIAGNOSIS — R3129 Other microscopic hematuria: Secondary | ICD-10-CM | POA: Diagnosis present

## 2015-12-19 MED ORDER — IOPAMIDOL (ISOVUE-370) INJECTION 76%
150.0000 mL | Freq: Once | INTRAVENOUS | Status: AC | PRN
  Administered 2015-12-19: 150 mL via INTRAVENOUS

## 2015-12-28 NOTE — Therapy (Signed)
[place], [place], [address] Phone: [phone]   Fax:  [phone]  Physical Therapy Treatment  Patient Details  Name: Candice Watkins MRN: [id number] Date of Birth: 10-12-1970 Referring Provider: Kathryne Hitch, MD  Encounter Date: 10/30/2015    Past Medical History:  Diagnosis Date  . Anxiety   . Arthritis    "knees, ankles" (10/11/2015)  . Asthma   . Depression   . Heartburn   . HLD (hyperlipidemia)   . Hypertension   . Hypothyroidism   . Kidney stones   . Migraine    "monthly" (10/11/2015)  . PONV (postoperative nausea and vomiting) X 1  . Stomach ulcer     Past Surgical History:  Procedure Laterality Date  . BREAST BIOPSY Bilateral    neg- core  . CESAREAN SECTION  2000  . JOINT REPLACEMENT    . KNEE ARTHROSCOPY Right   . THYROID SURGERY     "removed tumor"  . TOTAL KNEE ARTHROPLASTY Right 10/11/2015  . TOTAL KNEE ARTHROPLASTY Right 10/11/2015   Procedure: TOTAL KNEE ARTHROPLASTY;  Surgeon: Ninetta Lights, MD;  Location: [facility];  Service: Orthopedics;  Laterality: Right;  . TUBAL LIGATION    . VAGINAL HYSTERECTOMY      There were no vitals filed for this visit.                                    PT Long Term Goals - 10/30/15 1803      PT LONG TERM GOAL #1   Title Knee ROM 0-120 deg to accomodate for functional movements in daily activities by 7/21   Baseline see objective   Time 6   Period Weeks   Status New     PT LONG TERM GOAL #2   Title Pt will be able to independently climb stairs to her apartment with appropriate step-over-step pattern and pain <=2/10   Baseline unable to lead with RLE   Time 6   Period Weeks   Status New     PT LONG TERM GOAL #3   Title Pt will demonstrate 10 SLR without extension lag to indicate appropraite quad strength to provide support to knee joint   Baseline unable at IE   Time 6   Period Weeks   Status  New     PT LONG TERM GOAL #4   Title Pt will be independent with HEP and perform with appropriate form at discharge in oder to continue strengthening following PT.    Baseline in process of creating and advancing HEP   Time 6   Period Weeks   Status New             Patient will benefit from skilled therapeutic intervention in order to improve the following deficits and impairments:  Abnormal gait, Pain, Decreased range of motion, Increased edema, Difficulty walking  Visit Diagnosis: Pain in right knee - Plan: PT plan of care cert/re-cert  Difficulty in walking, not elsewhere classified - Plan: PT plan of care cert/re-cert  Muscle weakness (generalized) - Plan: PT plan of care cert/re-cert     Problem List Patient Active Problem List   Diagnosis Date Noted  . S/P total knee replacement 10/11/2015    Selinda Eon 12/28/2015, 4:02 PM  [facility] [address] [place], [place], [address] Phone: [phone]   Fax:  [phone]  Name: [name] MRN: [id number] Date of Birth: Mar 15, 1971   PHYSICAL THERAPY DISCHARGE SUMMARY  Visits from Start of Care: 1   Current functional level related to goals / functional outcomes: See above   Remaining deficits: See above   Education / Equipment: Anatomy of condition, POC, HEP  Plan: Patient agrees to discharge.  Patient goals were not met. Patient is being discharged due to not returning since the last visit.  ?????    Tamario Heal C. Alayziah Tangeman PT, DPT 12/28/15 4:03 PM

## 2015-12-29 ENCOUNTER — Ambulatory Visit (INDEPENDENT_AMBULATORY_CARE_PROVIDER_SITE_OTHER): Payer: Medicaid Other | Admitting: Urology

## 2015-12-29 ENCOUNTER — Encounter: Payer: Self-pay | Admitting: Urology

## 2015-12-29 VITALS — BP 136/90 | HR 105 | Ht 64.0 in | Wt 238.9 lb

## 2015-12-29 DIAGNOSIS — R3129 Other microscopic hematuria: Secondary | ICD-10-CM | POA: Diagnosis not present

## 2015-12-29 LAB — URINALYSIS, COMPLETE
Bilirubin, UA: NEGATIVE
Glucose, UA: NEGATIVE
Ketones, UA: NEGATIVE
Leukocytes, UA: NEGATIVE
NITRITE UA: NEGATIVE
PH UA: 6 (ref 5.0–7.5)
Protein, UA: NEGATIVE
Specific Gravity, UA: <1.005 — ABNORMAL LOW (ref 1.005–1.030)
UUROB: 0.2 mg/dL (ref 0.2–1.0)

## 2015-12-29 LAB — MICROSCOPIC EXAMINATION
Bacteria, UA: NONE SEEN
WBC UA: NONE SEEN /HPF (ref 0–?)

## 2015-12-29 MED ORDER — CIPROFLOXACIN HCL 500 MG PO TABS
500.0000 mg | ORAL_TABLET | Freq: Once | ORAL | Status: AC
  Administered 2015-12-29: 500 mg via ORAL

## 2015-12-29 NOTE — Progress Notes (Signed)
12/29/2015 2:02 PM   Candice Watkins Jul 15, 1970 [id number]  Referring provider: Perrin Maltese, MD [address] [place], [place] [address]  Chief Complaint  Patient presents with  . Cysto    HPI: Patient is a 45 -year-old [place] American who presents today as a referral from their PCP, Dr.McLean-Scocozza, for microscopic hematuria.  Patient's PCP did not forward any UA's at the time of the patient's visit.  Patient states that every time she goes to the doctor they tell her that she has blood in her urine.   She has not had a formal hematuria workup.   Patient doesn't have a prior history of microscopic hematuria.    She does not have a prior history of recurrent urinary tract infections, nephrolithiasis, trauma to the genitourinary tract or malignancies of the genitourinary tract.   She does have a family medical history of nephrolithiasis with her mother having kidney and bladder stones, but there is no family history of malignancies of the genitourinary tract or hematuria.   Today, she is having symptoms of frequent urination, urgency, nocturia and stress urinary incontinence.   Her UA today demonstrates 3/10 RBC's/hpf.  She is not having symptoms of dysuria, hesitancy, intermittency, straining to urinate or a weak urinary stream.     She is not experiencing any suprapubic pain, abdominal pain or flank pain. She denies any recent fevers, chills, nausea or vomiting.   She is not a smoker.   She is exposed to secondhand smoke.  She has worked with [employer].     CT Urogram negative for source of hematuria.   PMH: Past Medical History:  Diagnosis Date  . Anxiety   . Arthritis    "knees, ankles" (10/11/2015)  . Asthma   . Depression   . Heartburn   . HLD (hyperlipidemia)   . Hypertension   . Hypothyroidism   . Kidney stones   . Migraine    "monthly" (10/11/2015)  . PONV (postoperative nausea and vomiting) X 1  . Stomach ulcer     Surgical  History: Past Surgical History:  Procedure Laterality Date  . BREAST BIOPSY Bilateral    neg- core  . CESAREAN SECTION  2000  . JOINT REPLACEMENT    . KNEE ARTHROSCOPY Right   . THYROID SURGERY     "removed tumor"  . TOTAL KNEE ARTHROPLASTY Right 10/11/2015  . TOTAL KNEE ARTHROPLASTY Right 10/11/2015   Procedure: TOTAL KNEE ARTHROPLASTY;  Surgeon: Ninetta Lights, MD;  Location: [facility];  Service: Orthopedics;  Laterality: Right;  . TUBAL LIGATION    . VAGINAL HYSTERECTOMY      Home Medications:    Medication List       Accurate as of 12/29/15  2:02 PM. Always use your most recent med list.          ALPRAZolam 1 MG tablet Commonly known as:  XANAX Take 1 mg by mouth daily.   apixaban 2.5 MG Tabs tablet Commonly known as:  ELIQUIS Take 1 tab po q12 hours x 3 weeks following surgery to prevent blood clots   budesonide-formoterol 80-4.5 MCG/ACT inhaler Commonly known as:  SYMBICORT Inhale 1 puff into the lungs 2 (two) times daily. Reported on 12/08/2015   cetirizine 10 MG tablet Commonly known as:  ZYRTEC Take 10 mg by mouth daily as needed for allergies.   fluticasone 50 MCG/ACT nasal spray Commonly known as:  FLONASE   levothyroxine 100 MCG tablet Commonly known as:  SYNTHROID, LEVOTHROID Take 100  mcg by mouth daily.   lisinopril 40 MG tablet Commonly known as:  PRINIVIL,ZESTRIL Take 40 mg by mouth daily.   lisinopril-hydrochlorothiazide 10-12.5 MG tablet Commonly known as:  PRINZIDE,ZESTORETIC Take 1 tablet by mouth daily. Reported on 12/08/2015   methocarbamol 500 MG tablet Commonly known as:  ROBAXIN Take 1 tablet (500 mg total) by mouth 4 (four) times daily.   ondansetron 4 MG tablet Commonly known as:  ZOFRAN Take 1 tablet (4 mg total) by mouth every 8 (eight) hours as needed for nausea or vomiting.   oxyCODONE-acetaminophen 5-325 MG tablet Commonly known as:  ROXICET Take 1-2 tablets by mouth every 4 (four) hours as needed.   potassium chloride SA  20 MEQ tablet Commonly known as:  K-DUR,KLOR-CON Take 1 tablet (20 mEq total) by mouth daily.   senna-docusate 8.6-50 MG tablet Commonly known as:  Senokot-S Start 1 tab QHS, up to 2tabs BID. Max 4 tabs daily for constipation   simvastatin 20 MG tablet Commonly known as:  ZOCOR Take 20 mg by mouth daily.   TRINTELLIX 10 MG Tabs Generic drug:  vortioxetine HBr Take 1 tablet by mouth daily. Reported on 12/08/2015   Vitamin D (Ergocalciferol) 50000 units Caps capsule Commonly known as:  DRISDOL Take by mouth. Reported on 12/08/2015       Allergies:  Allergies  Allergen Reactions  . Metronidazole Swelling and Other (See Comments)    EDEMA  . Phenyltoloxamine-Acetaminophen Hives  . Ibuprofen Other (See Comments)    History of Stomach Ulcers  . Other Nausea And Vomiting    Progesic    . Septra [Sulfamethoxazole-Trimethoprim] Nausea And Vomiting    Family History: Family History  Problem Relation Age of Onset  . Breast cancer Neg Hx   . Kidney disease Neg Hx     Social History:  reports that she has never smoked. She has never used smokeless tobacco. She reports that she drinks about 1.2 oz of alcohol per week . She reports that she does not use drugs.  ROS:                                        Physical Exam: BP 136/90 (BP Location: Right Arm, Patient Position: Sitting, Cuff Size: Normal)   Pulse (!) 105   Ht 5\' 4"  (1.626 m)   Wt 238 lb 14.4 oz (108.4 kg)   BMI 41.01 kg/m   Constitutional:  Alert and oriented, No acute distress. HEENT: [place] AT, moist mucus membranes.  Trachea midline, no masses. Cardiovascular: No clubbing, cyanosis, or edema. Respiratory: Normal respiratory effort, no increased work of breathing. GI: Abdomen is soft, nontender, nondistended, no abdominal masses GU: No CVA tenderness.  Skin: No rashes, bruises or suspicious lesions. Lymph: No cervical or inguinal adenopathy. Neurologic: Grossly intact, no focal deficits,  moving all 4 extremities. Psychiatric: Normal mood and affect.  Laboratory Data: Lab Results  Component Value Date   WBC 6.1 11/23/2015   HGB 10.8 (L) 11/23/2015   HCT 33.0 (L) 11/23/2015   MCV 86.2 11/23/2015   PLT 253 11/23/2015    Lab Results  Component Value Date   CREATININE 0.61 12/08/2015    No results found for: PSA  No results found for: TESTOSTERONE  No results found for: HGBA1C  Urinalysis    Component Value Date/Time   COLORURINE YELLOW 11/23/2015 0821   APPEARANCEUR Clear 12/08/2015 1044   LABSPEC 1.005  11/23/2015 0821   PHURINE 7.0 11/23/2015 0821   GLUCOSEU Negative 12/08/2015 1044   HGBUR TRACE (A) 11/23/2015 0821   BILIRUBINUR Positive (A) 12/08/2015 1044   KETONESUR NEGATIVE 11/23/2015 0821   PROTEINUR 1+ (A) 12/08/2015 1044   PROTEINUR NEGATIVE 11/23/2015 0821   NITRITE Negative 12/08/2015 1044   NITRITE NEGATIVE 11/23/2015 0821   LEUKOCYTESUR Negative 12/08/2015 1044    Pertinent Imaging: CLINICAL DATA:  Hematuria.  EXAM: CT ABDOMEN AND PELVIS WITHOUT AND WITH CONTRAST  TECHNIQUE: Multidetector CT imaging of the abdomen and pelvis was performed following the standard protocol before and following the bolus administration of intravenous contrast.  CONTRAST:  150 cc Isovue 370  COMPARISON:  None.  FINDINGS: Lower chest: Lesion with dystrophic calcification posterior left lower lobe better seen on recent chest CT.  Hepatobiliary: No focal abnormality within the liver parenchyma. There is no evidence for gallstones, gallbladder wall thickening, or pericholecystic fluid. No intrahepatic or extrahepatic biliary dilation.  Pancreas: No focal mass lesion. No dilatation of the main duct. No intraparenchymal cyst. No peripancreatic edema.  Spleen: No splenomegaly. No focal mass lesion.  Adrenals/Urinary Tract: No adrenal nodule or mass.  Pre contrast imaging shows no stones in either kidney or ureter. No bladder  stones.  Imaging after IV contrast administration shows no enhancing renal lesion. No filling defect or wall thickening of the intrarenal collecting systems or renal pelvis of either kidney. No abnormality identified in either ureter. No focal bladder wall abnormality is evident  Stomach/Bowel: Stomach is nondistended. No gastric wall thickening. No evidence of outlet obstruction. Duodenum is normally positioned as is the ligament of Treitz. No small bowel wall thickening. No small bowel dilatation. The terminal ileum is normal. The appendix is normal. No gross colonic mass. No colonic wall thickening. No substantial diverticular change.  Vascular/Lymphatic: No abdominal aortic aneurysm. No abdominal aortic atherosclerotic calcification. There is no gastrohepatic or hepatoduodenal ligament lymphadenopathy. No intraperitoneal or retroperitoneal lymphadenopathy. No pelvic sidewall lymphadenopathy.  Reproductive: Uterus is surgically absent. There is no adnexal mass.  Other: No intraperitoneal free fluid.  Musculoskeletal: Bone windows reveal no worrisome lytic or sclerotic osseous lesions.  IMPRESSION: 1. No CT findings to explain the patient's history of hematuria.   Cystoscopy Procedure Note  Patient identification was confirmed, informed consent was obtained, and patient was prepped using Betadine solution.  Lidocaine jelly was administered per urethral meatus.    Preoperative abx where received prior to procedure.    Procedure: - Flexible cystoscope introduced, without any difficulty.   - Thorough search of the bladder revealed:    normal urethral meatus    normal urothelium    no stones    no ulcers     no tumors    no urethral polyps    no trabeculation  - Ureteral orifices were normal in position and appearance.  Post-Procedure: - Patient tolerated the procedure well   Assessment & Plan:    1. Microscopic hematuria -  Negative work up. Follow up in  on year for repeat urinalysis.  Return in about 1 year (around 12/28/2016).  Nickie Retort, MD  [facility] [address], [place], [place] [address] [phone]

## 2016-01-08 ENCOUNTER — Ambulatory Visit: Payer: Medicaid Other | Admitting: Physical Therapy

## 2016-01-19 HISTORY — PX: EXAM UNDER ANESTHESIA WITH MANIPULATION OF KNEE: SHX5816

## 2016-02-21 ENCOUNTER — Ambulatory Visit: Payer: Medicaid Other | Attending: Orthopedic Surgery | Admitting: Physical Therapy

## 2016-02-21 DIAGNOSIS — R262 Difficulty in walking, not elsewhere classified: Secondary | ICD-10-CM | POA: Insufficient documentation

## 2016-02-21 DIAGNOSIS — M6281 Muscle weakness (generalized): Secondary | ICD-10-CM | POA: Diagnosis present

## 2016-02-21 DIAGNOSIS — M25561 Pain in right knee: Secondary | ICD-10-CM | POA: Diagnosis present

## 2016-02-21 NOTE — Therapy (Signed)
[place], [place], [address] Phone: [phone]   Fax:  [phone]  Physical Therapy Evaluation  Patient Details  Name: Candice Watkins MRN: [id number] Date of Birth: 1971/04/07 Referring Provider: Kathryne Hitch, MD  Encounter Date: 02/21/2016      PT End of Session - 02/21/16 1248    Visit Number 1   Number of Visits 4   Authorization Type medicaid   PT Start Time [id number]   PT Stop Time [id number]   PT Time Calculation (min) 50 min   Activity Tolerance Patient tolerated treatment well   Behavior During Therapy [facility] for tasks assessed/performed      Past Medical History:  Diagnosis Date  . Anxiety   . Arthritis    "knees, ankles" (10/11/2015)  . Asthma   . Depression   . Heartburn   . HLD (hyperlipidemia)   . Hypertension   . Hypothyroidism   . Kidney stones   . Migraine    "monthly" (10/11/2015)  . PONV (postoperative nausea and vomiting) X 1  . Stomach ulcer     Past Surgical History:  Procedure Laterality Date  . BREAST BIOPSY Bilateral    neg- core  . CESAREAN SECTION  2000  . JOINT REPLACEMENT    . KNEE ARTHROSCOPY Right   . THYROID SURGERY     "removed tumor"  . TOTAL KNEE ARTHROPLASTY Right 10/11/2015  . TOTAL KNEE ARTHROPLASTY Right 10/11/2015   Procedure: TOTAL KNEE ARTHROPLASTY;  Surgeon: Ninetta Lights, MD;  Location: [facility];  Service: Orthopedics;  Laterality: Right;  . TUBAL LIGATION    . VAGINAL HYSTERECTOMY      There were no vitals filed for this visit.       Subjective Assessment - 02/21/16 1206    Subjective pt presenting to therpay following a right totoal knee on 10/12/15. Pt received one therpay evaluation and reports doing her HEP. Pt with decreased ROM in R knee and underwent a manipulation on 02/19/16. pt amb with a  st cane. pt reporting pain in r knee 3-4/10. pt reported taking pain meds prior.    Limitations House hold activities;Lifting   How long can you  sit comfortably? 30 minutes   How long can you stand comfortably? 5 minutes   How long can you walk comfortably? 5 minutes   Patient Stated Goals To get mobility back where I can walk better   Currently in Pain? Yes   Pain Score 4    Pain Location Knee   Pain Orientation Right   Pain Descriptors / Indicators Aching   Pain Type Chronic pain   Pain Onset More than a month ago   Pain Frequency Constant   Aggravating Factors  bending, transfers   Pain Relieving Factors pain meds, ice, heat   Effect of Pain on Daily Activities difficulty sleeping, transfer, bending, walking   Multiple Pain Sites No            [facility] PT Assessment - 02/21/16 0001      Assessment   Medical Diagnosis R knee manipulation   Referring Provider Kathryne Hitch, MD   Onset Date/Surgical Date 02/19/16   Hand Dominance Right   Prior Therapy yes following TKA, evaluation on 10/30/15     Restrictions   Weight Bearing Restrictions No     Balance Screen   Has the patient fallen in the past 6 months Yes   How many times? 1   Has the patient had  a decrease in activity level because of a fear of falling?  Yes   Is the patient reluctant to leave their home because of a fear of falling?  No     Home Environment   Living Environment Private residence   Living Arrangements Spouse/significant other;Children   Available Help at Discharge Family   Home Access Level entry   [place] - single point;Grab bars - tub/shower     Prior Function   Level of Independence Independent     Cognition   Overall Cognitive Status Within Functional Limits for tasks assessed     Observation/Other Assessments   Focus on Therapeutic Outcomes (FOTO)  64% limitation     ROM / Strength   AROM / PROM / Strength PROM;Strength     PROM   PROM Assessment Site Knee   Right/Left Knee Right;Left   Right Knee Extension 12   Right Knee Flexion 65   Left Knee Extension 0   Left Knee Flexion 105     Strength   Overall  Strength Comments L LE strength grossly 5/5   Strength Assessment Site Knee   Right/Left Knee Right   Right Knee Flexion 3+/5   Right Knee Extension 3+/5     Palpation   Patella mobility mild edema      Transfers   Transfers Sit to Stand   Sit to Stand 6: Modified independent (Device/Increase time)   Five time sit to stand comments  62 seconds     Ambulation/Gait   Ambulation/Gait Yes   Ambulation/Gait Assistance 6: Modified independent (Device/Increase time)   Ambulation Distance (Feet) 50 Feet   Assistive device Straight cane   Gait Pattern Step-to pattern   Ambulation Surface Level;Indoor                           PT Education - 02/21/16 1247    Education provided Yes   Education Details pt was instructed in HEP and importance of full knee mobility, gait training with st cane   Person(s) Educated Patient   Methods Explanation;Demonstration;Handout;Tactile cues;Verbal cues   Comprehension Verbal cues required;Returned demonstration;Verbalized understanding;Tactile cues required             PT Long Term Goals - 02/21/16 1255      PT LONG TERM GOAL #1   Title Pt will be able to amb with no assistive device with step through gait pattern with equal step lengths.    Baseline st cane with step to pattern 50% of the time   Time 2   Period Weeks   Status New     PT LONG TERM GOAL #2   Title pt will improve knee extension to </= 5 degrees in order to improve gait and functional transfers.    Baseline 12 degrees   Time 2   Period Weeks   Status New     PT LONG TERM GOAL #3   Title Pt will improve her R knee flexion to >/= 80 degrees in order to improve functional mobility.    Baseline 65 degrees   Time 2   Period Weeks   Status New     PT LONG TERM GOAL #4   Title Pt will be independent with HEP and perform with appropriate form at discharge in oder to continue strengthening following PT.    Baseline Issued HEP during evaluation 02/21/16    Time 2   Period Weeks   Status New  Plan - 02/21/16 1249    Clinical Impression Statement pt presents to physical therapy with decreased ROM in her right knee following her R TKA on 10/11/15 and her manipulation on 02/19/16. Ptreporting 4/10 pain upon arrival and reports taking pain meds prior to therapy. Skilled PT needed to progress pts ROM, Strength, and functional mobility.    Rehab Potential Good   PT Frequency 2x / week   PT Duration 2 weeks  financial restrictions   PT Treatment/Interventions ADLs/Self Care Home Management;Electrical Stimulation;Cryotherapy;Functional mobility training;Passive range of motion;Scar mobilization;Manual techniques;Taping;Vasopneumatic Device;Therapeutic exercise;Therapeutic activities;Moist Heat   PT Next Visit Plan knee ROM, Strengthening, patella mobilizations   PT Home Exercise Plan Quad sets, Hamstring stretch, prone knee flexion, supine knee flexion   Consulted and Agree with Plan of Care Patient      Patient will benefit from skilled therapeutic intervention in order to improve the following deficits and impairments:  Pain, Difficulty walking, Decreased activity tolerance, Impaired perceived functional ability, Decreased mobility, Decreased strength, Increased edema, Decreased balance, Decreased range of motion  Visit Diagnosis: Acute pain of right knee  Difficulty in walking, not elsewhere classified  Muscle weakness (generalized)     Problem List Patient Active Problem List   Diagnosis Date Noted  . S/P total knee replacement 10/11/2015  . Osteoarthritis of knee 09/27/2013  . 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 Hypothyroidism 09/14/2007  . Allergic rhinitis 03/04/2006    Edmonia Lynch Martin,MPT 02/21/2016, 1:20 PM  [place], [place], [address] Phone: [phone]   Fax:  [phone]  Name: Candice Watkins MRN:  [id number] Date of Birth: 01/16/1971

## 2016-02-27 ENCOUNTER — Ambulatory Visit: Payer: Medicaid Other | Admitting: Physical Therapy

## 2016-02-29 ENCOUNTER — Encounter: Payer: Medicaid Other | Admitting: Physical Therapy

## 2016-03-04 ENCOUNTER — Encounter: Payer: Medicaid Other | Admitting: Physical Therapy

## 2016-06-07 ENCOUNTER — Other Ambulatory Visit: Payer: Self-pay | Admitting: Internal Medicine

## 2016-06-07 DIAGNOSIS — Z1231 Encounter for screening mammogram for malignant neoplasm of breast: Secondary | ICD-10-CM

## 2016-08-19 ENCOUNTER — Ambulatory Visit: Payer: Medicaid Other | Attending: Internal Medicine

## 2016-08-22 ENCOUNTER — Emergency Department (HOSPITAL_COMMUNITY): Payer: Self-pay

## 2016-08-22 ENCOUNTER — Encounter (HOSPITAL_COMMUNITY): Payer: Self-pay | Admitting: Emergency Medicine

## 2016-08-22 ENCOUNTER — Emergency Department (HOSPITAL_COMMUNITY)
Admission: EM | Admit: 2016-08-22 | Discharge: 2016-08-23 | Disposition: A | Discharge status: Home / Self Care | Payer: Self-pay | Attending: Emergency Medicine | Admitting: Emergency Medicine

## 2016-08-22 DIAGNOSIS — Z96651 Presence of right artificial knee joint: Secondary | ICD-10-CM | POA: Insufficient documentation

## 2016-08-22 DIAGNOSIS — J45909 Unspecified asthma, uncomplicated: Secondary | ICD-10-CM | POA: Insufficient documentation

## 2016-08-22 DIAGNOSIS — R0789 Other chest pain: Secondary | ICD-10-CM | POA: Insufficient documentation

## 2016-08-22 DIAGNOSIS — I10 Essential (primary) hypertension: Secondary | ICD-10-CM | POA: Insufficient documentation

## 2016-08-22 DIAGNOSIS — E039 Hypothyroidism, unspecified: Secondary | ICD-10-CM | POA: Insufficient documentation

## 2016-08-22 DIAGNOSIS — R0609 Other forms of dyspnea: Secondary | ICD-10-CM

## 2016-08-22 DIAGNOSIS — R0600 Dyspnea, unspecified: Secondary | ICD-10-CM | POA: Insufficient documentation

## 2016-08-22 LAB — BASIC METABOLIC PANEL
ANION GAP: 12 (ref 5–15)
BUN: 5 mg/dL — AB (ref 6–20)
CHLORIDE: 101 mmol/L (ref 101–111)
CO2: 22 mmol/L (ref 22–32)
Calcium: 8.8 mg/dL — ABNORMAL LOW (ref 8.9–10.3)
Creatinine, Ser: 0.59 mg/dL (ref 0.44–1.00)
GFR calc Af Amer: >60 mL/min (ref >60)
GLUCOSE: 95 mg/dL (ref 65–99)
POTASSIUM: 3.2 mmol/L — AB (ref 3.5–5.1)
Sodium: 135 mmol/L (ref 135–145)

## 2016-08-22 LAB — HEPATIC FUNCTION PANEL
ALBUMIN: 3.6 g/dL (ref 3.5–5.0)
ALT: 11 U/L — AB (ref 14–54)
AST: 18 U/L (ref 15–41)
Alkaline Phosphatase: 61 U/L (ref 38–126)
BILIRUBIN INDIRECT: 0.8 mg/dL (ref 0.3–0.9)
Bilirubin, Direct: 0.1 mg/dL (ref 0.1–0.5)
TOTAL PROTEIN: 7.4 g/dL (ref 6.5–8.1)
Total Bilirubin: 0.9 mg/dL (ref 0.3–1.2)

## 2016-08-22 LAB — CBC
HEMATOCRIT: 34.9 % — AB (ref 36.0–46.0)
HEMOGLOBIN: 11.5 g/dL — AB (ref 12.0–15.0)
MCH: 26.9 pg (ref 26.0–34.0)
MCHC: 33 g/dL (ref 30.0–36.0)
MCV: 81.7 fL (ref 78.0–100.0)
Platelets: 321 10*3/uL (ref 150–400)
RBC: 4.27 MIL/uL (ref 3.87–5.11)
RDW: 14.2 % (ref 11.5–15.5)
WBC: 8.2 10*3/uL (ref 4.0–10.5)

## 2016-08-22 LAB — I-STAT TROPONIN, ED: Troponin i, poc: 0 ng/mL (ref 0.00–0.08)

## 2016-08-22 LAB — BRAIN NATRIURETIC PEPTIDE: B Natriuretic Peptide: 17.2 pg/mL (ref 0.0–100.0)

## 2016-08-22 LAB — LIPASE, BLOOD: Lipase: 10 U/L — ABNORMAL LOW (ref 11–51)

## 2016-08-22 MED ORDER — POTASSIUM CHLORIDE CRYS ER 20 MEQ PO TBCR
40.0000 meq | EXTENDED_RELEASE_TABLET | Freq: Once | ORAL | Status: AC
  Administered 2016-08-23: 40 meq via ORAL
  Filled 2016-08-22: qty 2

## 2016-08-22 MED ORDER — IOPAMIDOL (ISOVUE-370) INJECTION 76%
INTRAVENOUS | Status: AC
  Administered 2016-08-23: 100 mL
  Filled 2016-08-22: qty 100

## 2016-08-22 NOTE — ED Triage Notes (Signed)
Pt states for the last 2 weeks she has felt like she has fluid in her chest that she cant cough up. Pt describes it has a drowning sensation worse with exertion. Pt denies any pain. Pt is breathing easily at this time, no distress. Pt is warm and dry.

## 2016-08-22 NOTE — ED Provider Notes (Addendum)
[facility] DEPT Provider Note   CSN: [id number] Arrival date & time: 08/22/16  1729     History   Chief Complaint Chief Complaint  Patient presents with  . Shortness of Breath    HPI Candice Watkins is a 46 y.o. female with a hx of HTN, HLD, PUD presents to the Emergency Department complaining of intermittent squeezing chest pain for months.  She had gradual, persistent, squeezing today onset 8am and resolving at 2pm without return.  She reports these symptoms occur at rest and resolve within 1-2 minutes, but today it has not resolved.  Pt with strong family hx of early MI (1's in both parents).  She sees Dr. Chancy Milroy in [facility] for the last several year for these symptoms but missed her appointment on Feb.  She reports a normal stress test approx 1 year ago and a cardiac cath approx 4 years ago and reports it was also normal (I am not able to access these records).  She reports a constant feeling of "fluid in her lungs" worse with exertion present for weeks.  No URI ssx or cough.  She reports leg swelling during the day that resolves at night for many months.  No worsening.  Nothing makes the symptoms better.  Pt denies fever, chills, headache, neck pain, abd pain, N/V/D, weakness, dizziness, syncope.  Pt denies smoking history.  She denies hx of DVT,recent surgery, hormone usage, fracture, hx of cancer or lupus.  Pt reports severe depression with long periods in bed this week.  No unilateral leg pain or swelling.  Record review shows that patient had lower extremity venous Doppler on 10/18/2015 negative for DVT. She had CT angio of chest on 11/23/2015 negative for PE.  HPI  Past Medical History:  Diagnosis Date  . Anxiety   . Arthritis    "knees, ankles" (10/11/2015)  . Asthma   . Depression   . Heartburn   . HLD (hyperlipidemia)   . Hypertension   . Hypothyroidism   . Kidney stones   . Migraine    "monthly" (10/11/2015)  . PONV (postoperative nausea and vomiting) X 1  .  Stomach ulcer     Patient Active Problem List   Diagnosis Date Noted  . S/P total knee replacement 10/11/2015  . Osteoarthritis of knee 09/27/2013  . 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 Hypothyroidism 09/14/2007  . Allergic rhinitis 03/04/2006    Past Surgical History:  Procedure Laterality Date  . BREAST BIOPSY Bilateral    neg- core  . CESAREAN SECTION  2000  . JOINT REPLACEMENT    . KNEE ARTHROSCOPY Right   . THYROID SURGERY     "removed tumor"  . TOTAL KNEE ARTHROPLASTY Right 10/11/2015  . TOTAL KNEE ARTHROPLASTY Right 10/11/2015   Procedure: TOTAL KNEE ARTHROPLASTY;  Surgeon: Ninetta Lights, MD;  Location: [facility];  Service: Orthopedics;  Laterality: Right;  . TUBAL LIGATION    . VAGINAL HYSTERECTOMY      OB History    No data available       Home Medications    Prior to Admission medications   Medication Sig Start Date End Date Taking? Authorizing Provider  amLODipine (NORVASC) 10 MG tablet Take 10 mg by mouth daily. 06/06/16  Yes Historical Provider, MD  levothyroxine (SYNTHROID, LEVOTHROID) 100 MCG tablet Take 100 mcg by mouth daily. 09/12/15  Yes Historical Provider, MD    Family History Family History  Problem Relation Age of Onset  . Breast cancer Neg Hx   .  Kidney disease Neg Hx     Social History Social History  Substance Use Topics  . Smoking status: Never Smoker  . Smokeless tobacco: Never Used  . Alcohol use 1.2 oz/week    1 Glasses of wine, 1 Shots of liquor per week     Allergies   Metronidazole; Phenyltoloxamine-acetaminophen; Ibuprofen; Other; and Septra [sulfamethoxazole-trimethoprim]   Review of Systems Review of Systems  HENT: Negative for congestion, rhinorrhea, sinus pain, sinus pressure, sneezing and sore throat.   Respiratory: Positive for shortness of breath. Negative for cough.   Cardiovascular: Positive for chest pain and leg swelling.  Psychiatric/Behavioral: Positive for dysphoric mood.  All other systems reviewed  and are negative.    Physical Exam Updated Vital Signs BP (!) 111/51   Pulse 87   Temp 98.9 F (37.2 C) (Oral)   Resp 13   Ht 5\' 4"  (1.626 m)   Wt 108.9 kg   SpO2 100%   BMI 41.20 kg/m   Physical Exam  Constitutional: She appears well-developed and well-nourished. No distress.  Awake, alert, nontoxic appearance Obese  HENT:  Head: Normocephalic and atraumatic.  Mouth/Throat: Oropharynx is clear and moist. No oropharyngeal exudate.  Eyes: Conjunctivae are normal. No scleral icterus.  Neck: Normal range of motion. Neck supple.  Cardiovascular: Normal rate, regular rhythm and intact distal pulses.   Pulmonary/Chest: Effort normal and breath sounds normal. No tachypnea. No respiratory distress. She has no wheezes. She has no rhonchi. She has no rales.  Equal chest expansion  Abdominal: Soft. Bowel sounds are normal. She exhibits no mass. There is no tenderness. There is no rebound and no guarding.  Musculoskeletal: Normal range of motion. She exhibits no edema.  Neurological: She is alert.  Speech is clear and goal oriented Moves extremities without ataxia  Skin: Skin is warm and dry. She is not diaphoretic.  Psychiatric: She has a normal mood and affect.  Nursing note and vitals reviewed.    ED Treatments / Results  Labs (all labs ordered are listed, but only abnormal results are displayed) Labs Reviewed  BASIC METABOLIC PANEL - Abnormal; Notable for the following:       Result Value   Potassium 3.2 (*)    BUN 5 (*)    Calcium 8.8 (*)    All other components within normal limits  CBC - Abnormal; Notable for the following:    Hemoglobin 11.5 (*)    HCT 34.9 (*)    All other components within normal limits  HEPATIC FUNCTION PANEL - Abnormal; Notable for the following:    ALT 11 (*)    All other components within normal limits  LIPASE, BLOOD - Abnormal; Notable for the following:    Lipase 10 (*)    All other components within normal limits  BRAIN NATRIURETIC  PEPTIDE  I-STAT TROPOININ, ED    EKG  EKG Interpretation  Date/Time:  Thursday August 22 2016 17:42:21 EDT Ventricular Rate:  105 PR Interval:  142 QRS Duration: 68 QT Interval:  320 QTC Calculation: 422 R Axis:   88 Text Interpretation:  Sinus tachycardia Nonspecific T wave abnormality Abnormal ECG No significant change since last tracing Confirmed by Hazle Coca [phone]) on 08/22/2016 9:48:03 PM       Radiology Dg Chest 2 View  Result Date: 08/22/2016 CLINICAL DATA:  Left-sided chest pain shortness of breath for 2 weeks. EXAM: CHEST  2 VIEW COMPARISON:  10/01/2009 FINDINGS: The heart size and mediastinal contours are within normal limits. Both lungs are  clear. No pleural effusion or pneumothorax. The visualized skeletal structures are unremarkable. IMPRESSION: No active cardiopulmonary disease. Electronically Signed   By: Lajean Manes M.D.   On: 08/22/2016 18:23   Ct Angio Chest Pe W Or Wo Contrast  Result Date: 08/23/2016 CLINICAL DATA:  Dyspnea and tachycardia. Nausea. Two weeks duration. EXAM: CT ANGIOGRAPHY CHEST WITH CONTRAST TECHNIQUE: Multidetector CT imaging of the chest was performed using the standard protocol during bolus administration of intravenous contrast. Multiplanar CT image reconstructions and MIPs were obtained to evaluate the vascular anatomy. CONTRAST:  110 mL Isovue 370 intravenous. COMPARISON:  10/02/2009 CT, 08/22/2016 radiographs FINDINGS: Cardiovascular: Satisfactory opacification of the pulmonary arteries to the segmental level. No evidence of pulmonary embolism. Normal heart size. No pericardial effusion. Mediastinum/Nodes: No enlarged mediastinal, hilar, or axillary lymph nodes. Thyroid gland, trachea, and esophagus demonstrate no significant findings. Lungs/Pleura: Lungs are clear. No pleural effusion or pneumothorax. Airways are patent and normal in caliber. Upper Abdomen: No acute abnormality. Musculoskeletal: No chest wall abnormality. No acute or significant  osseous findings. Review of the MIP images confirms the above findings. IMPRESSION: Negative for acute pulmonary embolism.  No significant abnormality. Electronically Signed   By: Andreas Newport M.D.   On: 08/23/2016 01:30    Procedures Procedures (including critical care time)  Medications Ordered in ED Medications  potassium chloride SA (K-DUR,KLOR-CON) CR tablet 40 mEq (40 mEq Oral Given 08/23/16 0142)  iopamidol (ISOVUE-370) 76 % injection (100 mLs  Contrast Given 08/23/16 0005)     Initial Impression / Assessment and Plan / ED Course  I have reviewed the triage vital signs and the nursing notes.  Pertinent labs & imaging results that were available during my care of the patient were reviewed by me and considered in my medical decision making (see chart for details).  Clinical Course as of Aug 24 214  Fri Aug 23, 2016  0200 CT called stating that patient had extra of and the right arm when CT was attempted. Ice applied here and home care instructions along with warning signs given.  [HM]  0214 She ambulates in the hallway without respiratory distress, accessory muscle usage, tachypnea or hypoxia.  She remains without tachycardia for some time in the ED. Her chest pain has not returned.  [HM]    Clinical Course User Index [HM] Abigail Butts, PA-C    Patient presents with intermittent chest pain ongoing for some time. Several weeks of dyspnea on exertion. No evidence of PE on CT scan. Normal BNP. No peripheral edema. Doubt heart failure. Negative troponin. Mild hypokalemia noted and repleted in the department. Labs otherwise reassuring. No infectious symptoms. EKG without changes from previous.  She is well appearing and remains without chest pain. Ambulation in the department without difficulty.  Chest x-ray without pneumonia or pulmonary edema. Lung sounds are clear on exam.. Patient will be discharged home with instructions to follow-up with her cardiologist and PCP this week.  She is to return to the emergency department for new or worsening symptoms. Patient states understanding and is in agreement with the plan.  Final Clinical Impressions(s) / ED Diagnoses   Final diagnoses:  Dyspnea on exertion  Other chest pain    New Prescriptions Current Discharge Medication List       Abigail Butts, PA-C 08/23/16 0216    Quintella Reichert, MD 08/29/16 [place], PA-C 09/06/16 [id number]    Quintella Reichert, MD 09/09/16 1447

## 2016-08-22 NOTE — ED Notes (Signed)
Pt stated that she has a headache. Informed [facility] - Therapist, sports.

## 2016-08-23 ENCOUNTER — Encounter (HOSPITAL_COMMUNITY): Payer: Self-pay | Admitting: Radiology

## 2016-08-23 ENCOUNTER — Emergency Department (HOSPITAL_COMMUNITY): Payer: Self-pay

## 2016-08-23 ENCOUNTER — Other Ambulatory Visit (HOSPITAL_COMMUNITY): Payer: Self-pay | Admitting: Radiology

## 2016-08-23 NOTE — Progress Notes (Signed)
I contacted patient as follow up. Patient has no swelling, no blisters, no redness at site. I gave her the information to the Radiology department at [facility] if anything changes.

## 2016-08-23 NOTE — ED Notes (Signed)
Pt verbalized understanding discharge instructions and denies any further needs or questions at this time. VS stable, ambulatory and steady gait.   Pt was given ice packs and ace bandage for right arm. Pt was informed on how to care for site where IV infiltrated.

## 2016-08-23 NOTE — ED Notes (Signed)
Ambulated Pt in the hallway. Pt's sats stayed at or above 97%.

## 2016-08-23 NOTE — Discharge Instructions (Signed)
1. Medications: usual home medications 2. Treatment: rest, drink plenty of fluids,  3. Follow Up: Please followup with your primary doctor in 2-3 days for discussion of your diagnoses and further evaluation after today's visit; if you do not have a primary care doctor use the resource guide provided to find one; Please return to the ER for worsening symptoms, increasing difficulty breathing or other concerns.

## 2016-12-27 ENCOUNTER — Ambulatory Visit: Payer: Medicaid Other

## 2017-01-26 ENCOUNTER — Emergency Department (HOSPITAL_COMMUNITY)
Admission: EM | Admit: 2017-01-26 | Discharge: 2017-01-28 | Disposition: A | Discharge status: Other Acute Inpatient Hospital | Payer: Self-pay | Attending: Emergency Medicine | Admitting: Emergency Medicine

## 2017-01-26 ENCOUNTER — Encounter (HOSPITAL_COMMUNITY): Payer: Self-pay | Admitting: Emergency Medicine

## 2017-01-26 ENCOUNTER — Emergency Department (HOSPITAL_COMMUNITY): Payer: Self-pay

## 2017-01-26 DIAGNOSIS — F419 Anxiety disorder, unspecified: Secondary | ICD-10-CM | POA: Insufficient documentation

## 2017-01-26 DIAGNOSIS — Z79899 Other long term (current) drug therapy: Secondary | ICD-10-CM | POA: Insufficient documentation

## 2017-01-26 DIAGNOSIS — Z96651 Presence of right artificial knee joint: Secondary | ICD-10-CM | POA: Insufficient documentation

## 2017-01-26 DIAGNOSIS — R45851 Suicidal ideations: Secondary | ICD-10-CM

## 2017-01-26 DIAGNOSIS — R0789 Other chest pain: Secondary | ICD-10-CM

## 2017-01-26 DIAGNOSIS — F329 Major depressive disorder, single episode, unspecified: Secondary | ICD-10-CM | POA: Insufficient documentation

## 2017-01-26 DIAGNOSIS — E876 Hypokalemia: Secondary | ICD-10-CM

## 2017-01-26 DIAGNOSIS — J45909 Unspecified asthma, uncomplicated: Secondary | ICD-10-CM | POA: Insufficient documentation

## 2017-01-26 DIAGNOSIS — I10 Essential (primary) hypertension: Secondary | ICD-10-CM | POA: Insufficient documentation

## 2017-01-26 DIAGNOSIS — E039 Hypothyroidism, unspecified: Secondary | ICD-10-CM | POA: Insufficient documentation

## 2017-01-26 LAB — TSH: TSH: 2.735 u[IU]/mL (ref 0.350–4.500)

## 2017-01-26 LAB — RAPID URINE DRUG SCREEN, HOSP PERFORMED
Amphetamines: NOT DETECTED
BARBITURATES: NOT DETECTED
BENZODIAZEPINES: NOT DETECTED
Cocaine: NOT DETECTED
Opiates: NOT DETECTED
Tetrahydrocannabinol: NOT DETECTED

## 2017-01-26 LAB — CBC
HEMATOCRIT: 36.2 % (ref 36.0–46.0)
HEMOGLOBIN: 11.9 g/dL — AB (ref 12.0–15.0)
MCH: 27 pg (ref 26.0–34.0)
MCHC: 32.9 g/dL (ref 30.0–36.0)
MCV: 82.1 fL (ref 78.0–100.0)
PLATELETS: 321 10*3/uL (ref 150–400)
RBC: 4.41 MIL/uL (ref 3.87–5.11)
RDW: 15.1 % (ref 11.5–15.5)
WBC: 10.9 10*3/uL — AB (ref 4.0–10.5)

## 2017-01-26 LAB — BASIC METABOLIC PANEL
ANION GAP: 12 (ref 5–15)
BUN: 11 mg/dL (ref 6–20)
CHLORIDE: 98 mmol/L — AB (ref 101–111)
CO2: 22 mmol/L (ref 22–32)
Calcium: 8.9 mg/dL (ref 8.9–10.3)
Creatinine, Ser: 0.87 mg/dL (ref 0.44–1.00)
GFR calc non Af Amer: >60 mL/min (ref >60)
Glucose, Bld: 98 mg/dL (ref 65–99)
POTASSIUM: 2.8 mmol/L — AB (ref 3.5–5.1)
SODIUM: 132 mmol/L — AB (ref 135–145)

## 2017-01-26 LAB — MAGNESIUM: Magnesium: 1.9 mg/dL (ref 1.7–2.4)

## 2017-01-26 LAB — ETHANOL

## 2017-01-26 LAB — I-STAT TROPONIN, ED: Troponin i, poc: 0 ng/mL (ref 0.00–0.08)

## 2017-01-26 LAB — SALICYLATE LEVEL

## 2017-01-26 LAB — ACETAMINOPHEN LEVEL

## 2017-01-26 MED ORDER — ALUM & MAG HYDROXIDE-SIMETH 200-200-20 MG/5ML PO SUSP: 30.0000 mL | Freq: Four times a day (QID) | ORAL | Status: DC | PRN

## 2017-01-26 MED ORDER — AMLODIPINE BESYLATE 5 MG PO TABS
10.0000 mg | ORAL_TABLET | Freq: Every day | ORAL | Status: DC
  Administered 2017-01-27 – 2017-01-28 (×2): 10 mg via ORAL
  Filled 2017-01-26 (×2): qty 2

## 2017-01-26 MED ORDER — LORAZEPAM 1 MG PO TABS
0.0000 mg | ORAL_TABLET | Freq: Four times a day (QID) | ORAL | Status: DC
  Administered 2017-01-27: 1 mg via ORAL
  Filled 2017-01-26: qty 1

## 2017-01-26 MED ORDER — ONDANSETRON HCL 4 MG PO TABS: 4.0000 mg | ORAL_TABLET | Freq: Three times a day (TID) | ORAL | Status: DC | PRN

## 2017-01-26 MED ORDER — THIAMINE HCL 100 MG/ML IJ SOLN: 100.0000 mg | Freq: Every day | INTRAMUSCULAR | Status: DC

## 2017-01-26 MED ORDER — LEVOTHYROXINE SODIUM 100 MCG PO TABS
100.0000 ug | ORAL_TABLET | Freq: Every day | ORAL | Status: DC
  Administered 2017-01-27 – 2017-01-28 (×2): 100 ug via ORAL
  Filled 2017-01-26 (×3): qty 1

## 2017-01-26 MED ORDER — ACETAMINOPHEN 325 MG PO TABS: 650.0000 mg | ORAL_TABLET | ORAL | Status: DC | PRN

## 2017-01-26 MED ORDER — LORAZEPAM 1 MG PO TABS: 0.0000 mg | ORAL_TABLET | Freq: Two times a day (BID) | ORAL | Status: DC

## 2017-01-26 MED ORDER — HYDROCHLOROTHIAZIDE 12.5 MG PO CAPS
12.5000 mg | ORAL_CAPSULE | Freq: Every day | ORAL | Status: DC
  Administered 2017-01-27 – 2017-01-28 (×2): 12.5 mg via ORAL
  Filled 2017-01-26 (×2): qty 1

## 2017-01-26 MED ORDER — LORATADINE 10 MG PO TABS
10.0000 mg | ORAL_TABLET | Freq: Every day | ORAL | Status: DC
  Administered 2017-01-27 – 2017-01-28 (×2): 10 mg via ORAL
  Filled 2017-01-26 (×2): qty 1

## 2017-01-26 MED ORDER — POTASSIUM CHLORIDE CRYS ER 20 MEQ PO TBCR
40.0000 meq | EXTENDED_RELEASE_TABLET | Freq: Once | ORAL | Status: AC
  Administered 2017-01-26: 40 meq via ORAL
  Filled 2017-01-26: qty 2

## 2017-01-26 MED ORDER — LORAZEPAM 2 MG/ML IJ SOLN: 0.0000 mg | Freq: Four times a day (QID) | INTRAMUSCULAR | Status: DC

## 2017-01-26 MED ORDER — LORAZEPAM 2 MG/ML IJ SOLN: 0.0000 mg | Freq: Two times a day (BID) | INTRAMUSCULAR | Status: DC

## 2017-01-26 MED ORDER — VITAMIN B-1 100 MG PO TABS
100.0000 mg | ORAL_TABLET | Freq: Every day | ORAL | Status: DC
  Administered 2017-01-27 – 2017-01-28 (×3): 100 mg via ORAL
  Filled 2017-01-26 (×3): qty 1
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
## 2017-01-26 NOTE — ED Provider Notes (Signed)
[facility] DEPT Provider Note   CSN: [id number] Arrival date & time: 01/26/17  1937     History   Chief Complaint Chief Complaint  Patient presents with  . Chest Pain  . Suicidal    HPI Candice Watkins is a 46 y.o. female.  Pt presents to the ED today with cp and si.  The pt said she has a hx of depression and anxiety.  She lost her medical insurance a few months ago, and has been out of her meds for the past 4 months.  Due to no insurance, she has been unable to see her doctor to get a new rx.  The pt has been under a lot of stress.  She feels like she's been having panic attacks.  She does admit to taking an overdose of sleeping pills about 10 days ago to try to kill herself.  She has not tried to hurt herself again since then, but keeps having thoughts that she should.  The cp is in the left upper chest.  Pt denies any sob.  She denies f/c or cough.      Past Medical History:  Diagnosis Date  . Anxiety   . Arthritis    "knees, ankles" (10/11/2015)  . Asthma   . Depression   . Heartburn   . HLD (hyperlipidemia)   . Hypertension   . Hypothyroidism   . Kidney stones   . Migraine    "monthly" (10/11/2015)  . PONV (postoperative nausea and vomiting) X 1  . Stomach ulcer     Patient Active Problem List   Diagnosis Date Noted  . S/P total knee replacement 10/11/2015  . Osteoarthritis of knee 09/27/2013  . 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 Hypothyroidism 09/14/2007  . Allergic rhinitis 03/04/2006    Past Surgical History:  Procedure Laterality Date  . BREAST BIOPSY Bilateral    neg- core  . CESAREAN SECTION  2000  . JOINT REPLACEMENT    . KNEE ARTHROSCOPY Right   . THYROID SURGERY     "removed tumor"  . TOTAL KNEE ARTHROPLASTY Right 10/11/2015  . TOTAL KNEE ARTHROPLASTY Right 10/11/2015   Procedure: TOTAL KNEE ARTHROPLASTY;  Surgeon: Ninetta Lights, MD;  Location: [facility];  Service: Orthopedics;  Laterality: Right;  . TUBAL LIGATION    . VAGINAL  HYSTERECTOMY      OB History    No data available       Home Medications    Prior to Admission medications   Medication Sig Start Date End Date Taking? Authorizing Provider  amLODipine (NORVASC) 10 MG tablet Take 10 mg by mouth daily. 06/06/16  Yes [provider]  cetirizine (ZYRTEC) 10 MG chewable tablet Chew 10 mg by mouth at bedtime.   Yes [provider]  hydrochlorothiazide (MICROZIDE) 12.5 MG capsule Take 12.5 mg by mouth daily.   Yes [provider]  levothyroxine (SYNTHROID, LEVOTHROID) 100 MCG tablet Take 100 mcg by mouth daily. 09/12/15  Yes [provider]    Family History Family History  Problem Relation Age of Onset  . Breast cancer Neg Hx   . Kidney disease Neg Hx     Social History Social History  Substance Use Topics  . Smoking status: Never Smoker  . Smokeless tobacco: Never Used  . Alcohol use 1.2 oz/week    1 Glasses of wine, 1 Shots of liquor per week     Allergies   Metronidazole; Phenyltoloxamine-acetaminophen; Ibuprofen; Other; and Septra [sulfamethoxazole-trimethoprim]   Review of  Systems Review of Systems  Cardiovascular: Positive for chest pain.  Psychiatric/Behavioral: Positive for suicidal ideas. The patient is nervous/anxious.   All other systems reviewed and are negative.    Physical Exam Updated Vital Signs BP 129/81 (BP Location: Left Arm)   Pulse 99   Temp 97.9 F (36.6 C) (Oral)   Resp 18   Ht 5\' 4"  (1.626 m)   Wt 113.4 kg (250 lb)   SpO2 96%   BMI 42.91 kg/m   Physical Exam  Constitutional: She is oriented to person, place, and time. She appears well-developed and well-nourished.  HENT:  Head: Normocephalic and atraumatic.  Right Ear: External ear normal.  Left Ear: External ear normal.  Nose: Nose normal.  Mouth/Throat: Oropharynx is clear and moist.  Eyes: Pupils are equal, round, and reactive to light. Conjunctivae and EOM are normal.  Neck: Normal range of motion. Neck  supple.  Cardiovascular: Normal rate, regular rhythm, normal heart sounds and intact distal pulses.   Pulmonary/Chest: Effort normal and breath sounds normal. She exhibits tenderness.  Abdominal: Soft. Bowel sounds are normal.  Musculoskeletal: Normal range of motion.  Neurological: She is alert and oriented to person, place, and time.  Skin: Skin is warm.  Psychiatric: Her behavior is normal. Judgment normal. Her mood appears anxious. She expresses suicidal ideation.  Nursing note and vitals reviewed.    ED Treatments / Results  Labs (all labs ordered are listed, but only abnormal results are displayed) Labs Reviewed  BASIC METABOLIC PANEL - Abnormal; Notable for the following:       Result Value   Sodium 132 (*)    Potassium 2.8 (*)    Chloride 98 (*)    All other components within normal limits  CBC - Abnormal; Notable for the following:    WBC 10.9 (*)    Hemoglobin 11.9 (*)    All other components within normal limits  ACETAMINOPHEN LEVEL - Abnormal; Notable for the following:    Acetaminophen (Tylenol), Serum <10 (*)    All other components within normal limits  ETHANOL  SALICYLATE LEVEL  RAPID URINE DRUG SCREEN, HOSP PERFORMED  MAGNESIUM  BASIC METABOLIC PANEL  TSH  I-STAT TROPONIN, ED  POC URINE PREG, ED    EKG  EKG Interpretation  Date/Time:  Sunday January 26 2017 19:42:20 EDT Ventricular Rate:  99 PR Interval:  144 QRS Duration: 78 QT Interval:  340 QTC Calculation: 436 R Axis:   77 Text Interpretation:  Normal sinus rhythm Cannot rule out Anterior infarct , age undetermined T wave abnormality, consider inferior ischemia Abnormal ECG Confirmed by Isla Pence [phone]) on 01/26/2017 9:14:42 PM       Radiology Dg Chest 2 View  Result Date: 01/26/2017 CLINICAL DATA:  Left chest pain for 3 hours.  No known injury. EXAM: CHEST  2 VIEW COMPARISON:  CT chest 08/23/2016.  PA and lateral chest 08/22/2016. FINDINGS: The lungs are clear. Heart size is normal.  No pneumothorax or pleural effusion. No bony abnormality. IMPRESSION: Negative chest. Electronically Signed   By: Inge Rise M.D.   On: 01/26/2017 20:40    Procedures Procedures (including critical care time)  Medications Ordered in ED Medications  LORazepam (ATIVAN) injection 0-4 mg (not administered)    Or  LORazepam (ATIVAN) tablet 0-4 mg (not administered)  LORazepam (ATIVAN) injection 0-4 mg (not administered)    Or  LORazepam (ATIVAN) tablet 0-4 mg (not administered)  thiamine (VITAMIN B-1) tablet 100 mg (not administered)    Or  thiamine (B-1) injection 100 mg (not administered)  acetaminophen (TYLENOL) tablet 650 mg (not administered)  ondansetron (ZOFRAN) tablet 4 mg (not administered)  alum & mag hydroxide-simeth (MAALOX/MYLANTA) 200-200-20 MG/5ML suspension 30 mL (not administered)  potassium chloride SA (K-DUR,KLOR-CON) CR tablet 40 mEq (not administered)     Initial Impression / Assessment and Plan / ED Course  I have reviewed the triage vital signs and the nursing notes.  Pertinent labs & imaging results that were available during my care of the patient were reviewed by me and considered in my medical decision making (see chart for details).   CP does not appear to be cardiac.  Potassium is low, so she was given a dose here. Magnesium ordered.  BMP ordered for tomorrow to check potassium again.  TTS consult ordered.  They did their assessment and recommend inpatient treatment.  Pt is voluntary and wants help.  She is willing to stay.  Diet and prn meds ordered.  Final Clinical Impressions(s) / ED Diagnoses   Final diagnoses:  Suicidal ideation  Hypokalemia  Chest wall pain    New Prescriptions New Prescriptions   No medications on file     Isla Pence, MD 01/26/17 2251

## 2017-01-26 NOTE — ED Notes (Signed)
Pt st's approx 1 1/2 weeks ago she took a overdose of sleeping pills in attempt to kill hereself

## 2017-01-26 NOTE — ED Notes (Signed)
Report to Sara, RN

## 2017-01-26 NOTE — ED Triage Notes (Signed)
Pt reports she has centralized chest pain, denies n/v/d/sob or diaphoresis.  "It could could be a panic attack."  Pt also reports that she feels suicidal, has a hx of attempts bur no plan at this time.  Pt admits to a shot of vodka but no drugs today.

## 2017-01-27 LAB — BASIC METABOLIC PANEL
Anion gap: 10 (ref 5–15)
BUN: 10 mg/dL (ref 6–20)
CALCIUM: 8.7 mg/dL — AB (ref 8.9–10.3)
CO2: 23 mmol/L (ref 22–32)
Chloride: 103 mmol/L (ref 101–111)
Creatinine, Ser: 0.68 mg/dL (ref 0.44–1.00)
GFR calc Af Amer: >60 mL/min (ref >60)
GLUCOSE: 94 mg/dL (ref 65–99)
Potassium: 3 mmol/L — ABNORMAL LOW (ref 3.5–5.1)
Sodium: 136 mmol/L (ref 135–145)

## 2017-01-27 NOTE — ED Notes (Signed)
Breakfast ordered regular  

## 2017-01-27 NOTE — BH Assessment (Signed)
[place] Assessment Progress Note  Pt reassessed today. Pt reports feeling the same as yesterday (she was seen for a full TTS assessment less than 24 hours ago). Pt still endorses SI w/ a plan. IP treatment is still recommended.   Kenna Gilbert. Lovena Le, [place], [place], [facility] Counselor

## 2017-01-27 NOTE — ED Notes (Signed)
Chef Salad was ordered for Lunch w/ no dressing., Patient Didn't want the Roast Beef meal.

## 2017-01-27 NOTE — Progress Notes (Signed)
Patient meets criteria for inpatient treatment. CSW faxed referrals to the following inpatient facilities for review:  [facility] [address], [place], [place], [place], [place], [place]  TTS will continue to seek bed placement.  Radonna Ricker MSW, [place] Disposition [phone]

## 2017-01-27 NOTE — ED Notes (Signed)
Patient was given Cookies, and Ginger Ale, and A Regular Diet was ordered for Dinner.

## 2017-01-27 NOTE — ED Notes (Signed)
Breakfast Tray Ordered. 

## 2017-01-27 NOTE — ED Notes (Signed)
TTS at bedside. 

## 2017-01-27 NOTE — ED Notes (Signed)
Snack given.

## 2017-01-27 NOTE — ED Notes (Signed)
Received clothes from tech in pod e, not inventoried

## 2017-01-27 NOTE — ED Notes (Signed)
Pt eating lunch tray  

## 2017-01-27 NOTE — ED Notes (Signed)
Regular Diet has been ordered. 

## 2017-01-27 NOTE — ED Notes (Signed)
Patient was given Cookies and Ginger Ale for Snack, and A Lunch Order was taken.

## 2017-01-27 NOTE — ED Notes (Signed)
Patient ate 75% of her meal 

## 2017-01-28 ENCOUNTER — Inpatient Hospital Stay (HOSPITAL_COMMUNITY)
Admission: RE | Admit: 2017-01-28 | Discharge: 2017-01-31 | DRG: 885 | Disposition: A | Discharge status: Home / Self Care | Payer: Medicaid Other | Attending: Psychiatry | Admitting: Psychiatry

## 2017-01-28 ENCOUNTER — Encounter (HOSPITAL_COMMUNITY): Payer: Self-pay | Admitting: *Deleted

## 2017-01-28 DIAGNOSIS — Z62810 Personal history of physical and sexual abuse in childhood: Secondary | ICD-10-CM | POA: Diagnosis not present

## 2017-01-28 DIAGNOSIS — F332 Major depressive disorder, recurrent severe without psychotic features: Principal | ICD-10-CM | POA: Diagnosis present

## 2017-01-28 DIAGNOSIS — G4700 Insomnia, unspecified: Secondary | ICD-10-CM | POA: Diagnosis present

## 2017-01-28 DIAGNOSIS — E039 Hypothyroidism, unspecified: Secondary | ICD-10-CM | POA: Diagnosis present

## 2017-01-28 DIAGNOSIS — J45909 Unspecified asthma, uncomplicated: Secondary | ICD-10-CM | POA: Diagnosis present

## 2017-01-28 DIAGNOSIS — I10 Essential (primary) hypertension: Secondary | ICD-10-CM | POA: Diagnosis present

## 2017-01-28 DIAGNOSIS — Z90710 Acquired absence of both cervix and uterus: Secondary | ICD-10-CM

## 2017-01-28 DIAGNOSIS — Z62811 Personal history of psychological abuse in childhood: Secondary | ICD-10-CM | POA: Diagnosis not present

## 2017-01-28 DIAGNOSIS — Z96651 Presence of right artificial knee joint: Secondary | ICD-10-CM | POA: Diagnosis present

## 2017-01-28 DIAGNOSIS — E785 Hyperlipidemia, unspecified: Secondary | ICD-10-CM | POA: Diagnosis present

## 2017-01-28 DIAGNOSIS — F4010 Social phobia, unspecified: Secondary | ICD-10-CM | POA: Diagnosis not present

## 2017-01-28 DIAGNOSIS — Z634 Disappearance and death of family member: Secondary | ICD-10-CM | POA: Diagnosis not present

## 2017-01-28 DIAGNOSIS — Z6379 Other stressful life events affecting family and household: Secondary | ICD-10-CM | POA: Diagnosis not present

## 2017-01-28 DIAGNOSIS — F410 Panic disorder [episodic paroxysmal anxiety] without agoraphobia: Secondary | ICD-10-CM | POA: Diagnosis present

## 2017-01-28 DIAGNOSIS — Z91410 Personal history of adult physical and sexual abuse: Secondary | ICD-10-CM | POA: Diagnosis not present

## 2017-01-28 DIAGNOSIS — F39 Unspecified mood [affective] disorder: Secondary | ICD-10-CM | POA: Diagnosis not present

## 2017-01-28 DIAGNOSIS — R45851 Suicidal ideations: Secondary | ICD-10-CM | POA: Diagnosis present

## 2017-01-28 DIAGNOSIS — Z91411 Personal history of adult psychological abuse: Secondary | ICD-10-CM | POA: Diagnosis not present

## 2017-01-28 DIAGNOSIS — F419 Anxiety disorder, unspecified: Secondary | ICD-10-CM | POA: Diagnosis not present

## 2017-01-28 MED ORDER — HYDROXYZINE HCL 25 MG PO TABS
25.0000 mg | ORAL_TABLET | Freq: Three times a day (TID) | ORAL | Status: DC | PRN
  Administered 2017-01-28 – 2017-01-30 (×3): 25 mg via ORAL
  Filled 2017-01-28 (×3): qty 1
  Filled 2017-01-28: qty 10

## 2017-01-28 MED ORDER — AMLODIPINE BESYLATE 10 MG PO TABS
10.0000 mg | ORAL_TABLET | Freq: Every day | ORAL | Status: DC
  Administered 2017-01-29 – 2017-01-31 (×3): 10 mg via ORAL
  Filled 2017-01-28 (×2): qty 1
  Filled 2017-01-28: qty 7
  Filled 2017-01-28 (×3): qty 1

## 2017-01-28 MED ORDER — MAGNESIUM HYDROXIDE 400 MG/5ML PO SUSP: 30.0000 mL | Freq: Every day | ORAL | Status: DC | PRN

## 2017-01-28 MED ORDER — LORATADINE 10 MG PO TABS
10.0000 mg | ORAL_TABLET | Freq: Every day | ORAL | Status: DC
  Administered 2017-01-29 – 2017-01-31 (×3): 10 mg via ORAL
  Filled 2017-01-28 (×5): qty 1
  Filled 2017-01-28: qty 7

## 2017-01-28 MED ORDER — ALUM & MAG HYDROXIDE-SIMETH 200-200-20 MG/5ML PO SUSP: 30.0000 mL | Freq: Four times a day (QID) | ORAL | Status: DC | PRN

## 2017-01-28 MED ORDER — HYDROCHLOROTHIAZIDE 12.5 MG PO CAPS
12.5000 mg | ORAL_CAPSULE | Freq: Every day | ORAL | Status: DC
  Administered 2017-01-29 – 2017-01-31 (×3): 12.5 mg via ORAL
  Filled 2017-01-28: qty 7
  Filled 2017-01-28 (×5): qty 1

## 2017-01-28 MED ORDER — POTASSIUM CHLORIDE CRYS ER 20 MEQ PO TBCR
20.0000 meq | EXTENDED_RELEASE_TABLET | Freq: Two times a day (BID) | ORAL | Status: AC
  Administered 2017-01-28 – 2017-01-30 (×4): 20 meq via ORAL
  Filled 2017-01-28 (×6): qty 1

## 2017-01-28 MED ORDER — LEVOTHYROXINE SODIUM 100 MCG PO TABS
100.0000 ug | ORAL_TABLET | Freq: Every day | ORAL | Status: DC
  Administered 2017-01-29 – 2017-01-31 (×3): 100 ug via ORAL
  Filled 2017-01-28 (×2): qty 1
  Filled 2017-01-28: qty 7
  Filled 2017-01-28 (×3): qty 1

## 2017-01-28 MED ORDER — TRAZODONE HCL 50 MG PO TABS
50.0000 mg | ORAL_TABLET | Freq: Every evening | ORAL | Status: DC | PRN
  Administered 2017-01-28 – 2017-01-30 (×3): 50 mg via ORAL
  Filled 2017-01-28: qty 7
  Filled 2017-01-28 (×3): qty 1

## 2017-01-28 NOTE — ED Notes (Signed)
Called [facility] again to cancel until patient is seen by Dr. Eulis Foster for [place].

## 2017-01-28 NOTE — Progress Notes (Signed)
Per Letitia Libra , [facility], patient has been accepted to [facility], bed 400-2 ; Accepting provider is Lindon Romp, NP; Attending provider is Dr. Parke Poisson.   Patient can arrive at 2:45pm. Number for report is [phone].    Dagoberto Reef, RN notified.   Radonna Ricker MSW, [place] Disposition [phone]

## 2017-01-28 NOTE — ED Notes (Signed)
[facility] requests that patient be sent after 1700.

## 2017-01-28 NOTE — ED Notes (Signed)
Breakfast tray in room.

## 2017-01-28 NOTE — ED Provider Notes (Signed)
17: 20-per report of nursing staff the patient has been accepted for transfer, to the behavioral health [facility], Dr. Parke Poisson.  At this time the patient is alert, cooperative and agrees to go to the behavioral health Hospital.   Daleen Bo, MD 01/28/17 1726

## 2017-01-28 NOTE — ED Notes (Addendum)
Dinner ordered per email to service response (attempted to call in order but they would not take it by phone and requested I email them.) Requested that tray be delivered before 1700 if possible.  Snack offered-sprite given.

## 2017-01-28 NOTE — Progress Notes (Signed)
CSW spoke with Horris Latino, RN to determine whether or not the patient will be willing to sign herself in voluntarily to [facility].   Per Horris Latino, the patient agrees to sign herself in voluntarily.   CSW requested voluntary consent be faxed to [employer] at [phone] once completed.   Radonna Ricker MSW, [place] Disposition [phone]

## 2017-01-28 NOTE — Progress Notes (Signed)
Pt is a 46 year old female admitted to [facility] voluntary with si thoughts to OD and HI thoughts when others upset her. She has losses over the past years of her father, sister, brother, aunt and friends. Pt has a medical hx of arthritis, asthma, HTN, CHF, headaches and hypothyroid disease. Pt as a hx of OD attempt. Pt oriented to the unit and she has no belongings to place in a locker.

## 2017-01-28 NOTE — ED Notes (Signed)
Patient heading to shower with sitter at this time.

## 2017-01-28 NOTE — ED Notes (Signed)
[facility] called 

## 2017-01-28 NOTE — ED Notes (Signed)
Supper provided to patient.

## 2017-01-28 NOTE — ED Notes (Signed)
[facility] notified for transport. 

## 2017-01-29 DIAGNOSIS — F332 Major depressive disorder, recurrent severe without psychotic features: Principal | ICD-10-CM

## 2017-01-29 DIAGNOSIS — F4010 Social phobia, unspecified: Secondary | ICD-10-CM

## 2017-01-29 DIAGNOSIS — F39 Unspecified mood [affective] disorder: Secondary | ICD-10-CM

## 2017-01-29 DIAGNOSIS — Z62810 Personal history of physical and sexual abuse in childhood: Secondary | ICD-10-CM

## 2017-01-29 DIAGNOSIS — F410 Panic disorder [episodic paroxysmal anxiety] without agoraphobia: Secondary | ICD-10-CM

## 2017-01-29 DIAGNOSIS — Z6379 Other stressful life events affecting family and household: Secondary | ICD-10-CM

## 2017-01-29 DIAGNOSIS — G4700 Insomnia, unspecified: Secondary | ICD-10-CM

## 2017-01-29 DIAGNOSIS — Z62811 Personal history of psychological abuse in childhood: Secondary | ICD-10-CM

## 2017-01-29 DIAGNOSIS — F419 Anxiety disorder, unspecified: Secondary | ICD-10-CM

## 2017-01-29 DIAGNOSIS — Z634 Disappearance and death of family member: Secondary | ICD-10-CM

## 2017-01-29 DIAGNOSIS — Z91411 Personal history of adult psychological abuse: Secondary | ICD-10-CM

## 2017-01-29 DIAGNOSIS — Z91410 Personal history of adult physical and sexual abuse: Secondary | ICD-10-CM

## 2017-01-29 MED ORDER — ARIPIPRAZOLE 10 MG PO TABS
10.0000 mg | ORAL_TABLET | Freq: Every day | ORAL | Status: DC
  Administered 2017-01-29 – 2017-01-31 (×3): 10 mg via ORAL
  Filled 2017-01-29: qty 1
  Filled 2017-01-29: qty 7
  Filled 2017-01-29 (×4): qty 1
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
## 2017-01-29 NOTE — Plan of Care (Signed)
Problem: Education: Goal: Verbalization of understanding the information provided will improve Outcome: Completed/Met Date Met: 01/29/17 Patient verbalizes understanding of information, education provided.   Problem: Safety: Goal: Periods of time without injury will increase Outcome: Progressing Patient has not engaged in self harm, denies thoughts to do so. Does endorse passive SI however denies plan, intent and verbally contracts for safety.

## 2017-01-29 NOTE — BHH Suicide Risk Assessment (Signed)
[facility] INPATIENT:  Family/Significant Other Suicide Prevention Education  Suicide Prevention Education:  Contact Attempts: Solmon Ice (pt's cousin [phone]), has been identified by the patient as the family member/significant other with whom the patient will be residing, and identified as the person(s) who will aid the patient in the event of a mental health crisis.  With written consent from the patient, two attempts were made to provide suicide prevention education, prior to and/or following the patient's discharge.  We were unsuccessful in providing suicide prevention education.  A suicide education pamphlet was given to the patient to share with family/significant other.  Date and time of first attempt: 01/29/17 @2 :15pm. No answer. CSW left a message.  Candice Watkins, MSW, LCSWA  01/29/2017, 2:17 PM

## 2017-01-29 NOTE — Progress Notes (Signed)
Adult Psychoeducational Group Note  Date:  01/29/2017 Time:  2:49 AM  Group Topic/Focus:  Wrap-Up Group:   The focus of this group is to help patients review their daily goal of treatment and discuss progress on daily workbooks.  Participation Level:  Active  Participation Quality:  Appropriate  Affect:  Appropriate  Cognitive:  Appropriate  Insight: Appropriate  Engagement in Group:  Engaged  Modes of Intervention:  Discussion  Additional Comments:  Pt stated her goal for today was to interact with her peers because she was a new patient. Pt felt she accomplished her goal today. Pt rated her day a 6. Pt stated tomorrow goal is talk with doctor about her medication.  Candy Sledge 01/29/2017, 2:49 AM
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
## 2017-01-29 NOTE — Progress Notes (Signed)
Candice Watkins had been up and visible in milieu this evening, did attend and participate in evening group activity. She was seen interacting appropriately with peers in milieu. She was able to attend to personal hygiene independently and did complain of anxiety and did ask for and receive medications without incident. A. Support and encouragement provided. R. Safety maintained, will continue to monitor.
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
## 2017-01-29 NOTE — BHH Group Notes (Signed)
[facility] Mental Health Association Group Therapy 01/29/2017 1:15pm  Type of Therapy: Mental Health Association Presentation  Participation Level: Active  Participation Quality: Attentive  Affect: Appropriate  Cognitive: Oriented  Insight: Developing/Improving  Engagement in Therapy: Engaged  Modes of Intervention: Discussion, Education and Socialization  Summary of Progress/Problems: Mental Health Association ([place]) Speaker came to talk about his personal journey with living with a mental health diagnosis. The pt processed ways by which to relate to the speaker. [place] speaker provided handouts and educational information pertaining to groups and services offered by the [facility]. Pt was engaged in speaker's presentation and was receptive to resources provided.    Georga Kaufmann, MSW, LCSWA 01/29/2017 4:08 PM

## 2017-01-29 NOTE — Progress Notes (Signed)
Adult Psychoeducational Group Note  Date:  01/29/2017 Time:  9:39 PM  Group Topic/Focus:  Wrap-Up Group:   The focus of this group is to help patients review their daily goal of treatment and discuss progress on daily workbooks.  Participation Level:  Minimal  Participation Quality:  Appropriate  Affect:  Appropriate  Cognitive:  Alert  Insight: Appropriate  Engagement in Group:  Engaged  Modes of Intervention:  Discussion  Additional Comments:  Pt rated her day 6/10. Her goal is to stop holding so much in and learn how to talk about how she is feeling.   Sharmon Revere 01/29/2017, 9:39 PM

## 2017-01-29 NOTE — Progress Notes (Signed)
Recreation Therapy Notes  Date: 01/29/17 Time: 0930 Location: 300 [address] Dayroom  Group Topic: Stress Management  Goal Area(s) Addresses:  Patient will verbalize importance of using healthy stress management.  Patient will identify positive emotions associated with healthy stress management.   Intervention: Stress Management  Activity :  Guided Imagery.  LRT introduced the stress management technique of guided imagery.  LRT read a script to allow patients to participate in the activity.  Patients were to follow along as LRT read the script to engage in the activity.  Education:  Stress Management, Discharge Planning.   Education Outcome: Acknowledges edcuation/In group clarification offered/Needs additional education  Clinical Observations/Feedback: Pt did not attend group.   Victorino Sparrow, LRT/CTRS         Victorino Sparrow A 01/29/2017 12:05 PM

## 2017-01-29 NOTE — Progress Notes (Signed)
D: Patient observed in dayroom at times. At other times resting in bed as she states sleep continues to be an issue for her. Frequent contacts made 1:1 throughout shift. Patient verbalizes to this writer she remains flat, depressed and sad. Endorses passive SI but denies plan or intent. Verbal contract for safety. Per self inventory and discussions with writer, rates depression at an 8/10, hopelessness at a 10/10 and anxiety at a 10/10. Rates sleep as fair, appetite as fair, energy as low and concentration as poor.  States goal for today is "not to harm myself or think of harming myself." Reports L knee pain of a 4/10, no other physical problems.   A: Medicated per orders, no prns requested or required. Level III obs in place for safety. Emotional support offered and self inventory reviewed. Encouraged completion of Suicide Safety Plan and programming participation. Discussed POC with MD, SW.   R: Patient verbalizes understanding of POC. Patient denies HI/AVH and remains safe on level III obs. Will continue to monitor closely and make verbal contact frequently.

## 2017-01-30 NOTE — Progress Notes (Addendum)
[facility] MD Progress Note  01/30/2017 9:10 AM [place] DEVLIN MCVEIGH  MRN:  [id number] Subjective:  Patient has reported some ongoing depression, but overall she states she feels improved, and states " my mood is better today".  States she has had chronic passive thoughts of death or dying, which she states have been intermittent " for years ", but denies any current suicidal plan or intention. At this time she is future oriented and is hoping to discharge soon.  Denies medication side effects. Objective: I have discussed case with treatment team and have met with patient. As above, patient is reporting improving mood and as she improves she is focusing more on being discharged soon. She denies medication side effects. No akathisia. States sleep and appetite both improved.  Staff reports patient has been visible in milieu, going to groups . No disruptive or agitated behaviors on unit.  Principal Problem: MDD (major depressive disorder), recurrent severe, without psychosis ([place]) Diagnosis:   Patient Active Problem List   Diagnosis Date Noted  . MDD (major depressive disorder), recurrent severe, without psychosis ([place]) [F33.2] 01/28/2017  . S/P total knee replacement [Z96.659] 10/11/2015  . Osteoarthritis of knee [M17.10] 09/27/2013  . 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 Hypothyroidism [E03.9] 09/14/2007  . Allergic rhinitis [J30.9] 03/04/2006   Total Time spent with patient: 20 minutes    Past Medical History:  Past Medical History:  Diagnosis Date  . Anxiety   . Arthritis    "knees, ankles" (10/11/2015)  . Asthma   . Depression   . Heartburn   . HLD (hyperlipidemia)   . Hypertension   . Hypothyroidism   . Kidney stones   . Migraine    "monthly" (10/11/2015)  . PONV (postoperative nausea and vomiting) X 1  . Stomach ulcer     Past Surgical History:  Procedure Laterality Date  . BREAST BIOPSY Bilateral    neg- core  . CESAREAN SECTION  2000  . JOINT REPLACEMENT     . KNEE ARTHROSCOPY Right   . THYROID SURGERY     "removed tumor"  . TOTAL KNEE ARTHROPLASTY Right 10/11/2015  . TOTAL KNEE ARTHROPLASTY Right 10/11/2015   Procedure: TOTAL KNEE ARTHROPLASTY;  Surgeon: Ninetta Lights, MD;  Location: [facility];  Service: Orthopedics;  Laterality: Right;  . TUBAL LIGATION    . VAGINAL HYSTERECTOMY     Family History:  Family History  Problem Relation Age of Onset  . Breast cancer Neg Hx   . Kidney disease Neg Hx    Social History:  History  Alcohol Use  . 1.2 oz/week  . 1 Glasses of wine, 1 Shots of liquor per week     History  Drug Use No    Social History   Social History  . Marital status: Single    Spouse name: N/A  . Number of children: N/A  . Years of education: N/A   Social History Main Topics  . Smoking status: Never Smoker  . Smokeless tobacco: Never Used  . Alcohol use 1.2 oz/week    1 Glasses of wine, 1 Shots of liquor per week  . Drug use: No  . Sexual activity: Yes    Birth control/ protection: None     Comment: hysterectomy   Other Topics Concern  . None   Social History Narrative  . None   Additional Social History:   Sleep: improved  Appetite:  improved   Current Medications: Current Facility-Administered Medications  Medication Dose Route Frequency Provider Last  Rate Last Dose  . alum & mag hydroxide-simeth (MAALOX/MYLANTA) 200-200-20 MG/5ML suspension 30 mL  30 mL Oral Q6H PRN Okonkwo, Justina A, NP      . amLODipine (NORVASC) tablet 10 mg  10 mg Oral Daily Okonkwo, Justina A, NP   10 mg at 01/30/17 0828  . ARIPiprazole (ABILIFY) tablet 10 mg  10 mg Oral Daily Money, Lowry Ram, FNP   10 mg at 01/30/17 0258  . hydrochlorothiazide (MICROZIDE) capsule 12.5 mg  12.5 mg Oral Daily Okonkwo, Justina A, NP   12.5 mg at 01/30/17 0829  . hydrOXYzine (ATARAX/VISTARIL) tablet 25 mg  25 mg Oral TID PRN Hughie Closs A, NP   25 mg at 01/29/17 2120  . levothyroxine (SYNTHROID, LEVOTHROID) tablet 100 mcg  100 mcg Oral QAC  breakfast Okonkwo, Justina A, NP   100 mcg at 01/30/17 0624  . loratadine (CLARITIN) tablet 10 mg  10 mg Oral Daily Okonkwo, Justina A, NP   10 mg at 01/30/17 0829  . magnesium hydroxide (MILK OF MAGNESIA) suspension 30 mL  30 mL Oral Daily PRN Okonkwo, Justina A, NP      . traZODone (DESYREL) tablet 50 mg  50 mg Oral QHS PRN Okonkwo, Justina A, NP   50 mg at 01/29/17 2119    Lab Results: No results found for this or any previous visit (from the past 48 hour(s)).  Blood Alcohol level:  Lab Results  Component Value Date   ETH <5 52/77/8242    Metabolic Disorder Labs: No results found for: HGBA1C, MPG No results found for: PROLACTIN No results found for: CHOL, TRIG, HDL, CHOLHDL, VLDL, LDLCALC  Physical Findings: AIMS: Facial and Oral Movements Muscles of Facial Expression: None, normal Lips and Perioral Area: None, normal Jaw: None, normal Tongue: None, normal,Extremity Movements Upper (arms, wrists, hands, fingers): None, normal Lower (legs, knees, ankles, toes): None, normal, Trunk Movements Neck, shoulders, hips: None, normal, Overall Severity Severity of abnormal movements (highest score from questions above): None, normal Incapacitation due to abnormal movements: None, normal Patient's awareness of abnormal movements (rate only patient's report): No Awareness, Dental Status Current problems with teeth and/or dentures?: No Does patient usually wear dentures?: No (pt wears partials)  CIWA:    COWS:     Musculoskeletal: Strength & Muscle Tone: within normal limits Gait & Station: normal Patient leans: N/A  Psychiatric Specialty Exam: Physical Exam  ROS no headache, no chest pain, no shortness of breath, (+) nausea, no vomiting, no fever   Blood pressure 113/74, pulse (!) 118, temperature 97.7 F (36.5 C), temperature source Oral, resp. rate 16, height 5' 2.25" (1.581 m), weight 115.2 kg (254 lb), SpO2 99 %.Body mass index is 46.08 kg/m.  General Appearance: Fairly  Groomed  Eye Contact:  Good  Speech:  Normal Rate  Volume:  Normal  Mood:  improving mood, states she is feeling better   Affect:  more reactive, smiles at times appropriately   Thought Process:  Linear and Descriptions of Associations: Intact  Orientation:  Full (Time, Place, and Person)  Thought Content:  denies hallucinations, no delusions, not internally preoccupied   Suicidal Thoughts:  No denies any current suicidal or self injurious ideations, denies homicidal or violent ideations , currently future oriented   Homicidal Thoughts:  No  Memory:  recent and remote grossly intact   Judgement:  Other:  improving   Insight:  improving   Psychomotor Activity:  Normal  Concentration:  Concentration: Good and Attention Span: Good  Recall:  Good  Fund of Knowledge:  Good  Language:  Good  Akathisia:  Negative  Handed:  Right  AIMS (if indicated):     Assets:  Communication Skills Desire for Improvement Resilience  ADL's:  Intact  Cognition:  WNL  Sleep:  Number of Hours: 5.75   Assessment - patient reports she is feeling better than on admission , denies any lingering suicidal ideations and is currently more future oriented. As she improves she is focusing more on discharge and states she is hoping for discharge soon. Thus far tolerating Abilify trial well, denies side effects and does not present with akathisia at this time. Sleep also improved .   Treatment Plan Summary: Daily contact with patient to assess and evaluate symptoms and progress in treatment, Medication management, Plan inpatient treatment  and medications as below Encourage ongoing group and milieu participation to work on coping skills and symptom reduction Continue Abilify 10 mgrs QDAY for mood disorder, depression Continue Vistaril 25 mgrs Q 8 hours PRN for anxiety  Continue Trazodone 50 mgrs QHS PRN for insomnia Continue Synthroid for history of hypothyroidism. Repeat BMP in AM to monitor K+  Treatment team  working on disposition planning options . Jenne Campus, MD 01/30/2017, 9:10 AM

## 2017-01-30 NOTE — Progress Notes (Signed)
D: Pt was in dayroom upon initial approach.  Pt presents with appropriate affect and anxious mood.  Describes her day as "okay" and reports goal is to "get out."  Pt reports she is discharging tomorrow and she feels safe to do so.  She plans to "follow up with my therapist and take my medications" after discharge.  Pt denies SI/HI, denies hallucinations, denies pain.  Pt has been visible in milieu interacting with peers and staff appropriately.  Pt attended evening group.    A: Introduced self to pt.  Actively listened to pt and offered support and encouragement. PRN medication administered for anxiety and sleep.  Q15 minute safety checks maintained.  R: Pt is safe on the unit.  Pt is compliant with medications.  Pt verbally contracts for safety.  Will continue to monitor and assess.

## 2017-01-30 NOTE — Progress Notes (Signed)
D: Pt presents with depressed affect and mood on interaction. A & O X4. Denies SI, HI and AVH. Reports fair appetite and sleep with normal energy and poor concentration level. Rates her depression 6/10, hopelessness 6/10 and anxiety 7/10. Guarded and isolative to room. Pt's goal for today is to "talk about her feelings and make plans to be released".  A: Emotional support and availability provided to pt. Medications administered to pt as prescribed with verbal education and effects monitored. Encouraged pt to voice concerns. Q 15 minutes safety checks continues without self harm gestures to note at this time.  R: Pt receptive to care. Took her medications without issues. Denies adverse drug reactions when assessed. POC continues for safety and mood stability.

## 2017-01-30 NOTE — Progress Notes (Signed)
Pt attend wrap up group.  Her day was a 7. Her goal was to get out of here. She was supposed to go home. She will go home tomorrow.

## 2017-01-30 NOTE — Progress Notes (Signed)
[name] had been up and visible in milieu this evening, did attend and participate in evening group activity. She spoke of her day some and was seen interacting appropriately with peers in milieu. She requested and received medications without incident and denied any SI. A. Support and encouragement provided. R. Safety maintained, will continue to monitor.

## 2017-01-30 NOTE — BHH Suicide Risk Assessment (Signed)
[place] INPATIENT:  Family/Significant Other Suicide Prevention Education  Suicide Prevention Education:  Education Completed; Solmon Ice, Pt's cousin [phone], has been identified by the patient as the family member/significant other with whom the patient will be residing, and identified as the person(s) who will aid the patient in the event of a mental health crisis (suicidal ideations/suicide attempt).  With written consent from the patient, the family member/significant other has been provided the following suicide prevention education, prior to the and/or following the discharge of the patient.  The suicide prevention education provided includes the following:  Suicide risk factors  Suicide prevention and interventions  National Suicide Hotline telephone number  [facility] assessment telephone number  [facility] Emergency Assistance [place] and/or Residential Mobile Crisis Unit telephone number  Request made of family/significant other to:  Remove weapons (e.g., guns, rifles, knives), all items previously/currently identified as safety concern.    Remove drugs/medications (over-the-counter, prescriptions, illicit drugs), all items previously/currently identified as a safety concern.  The family member/significant other verbalizes understanding of the suicide prevention education information provided.  The family member/significant other agrees to remove the items of safety concern listed above.  Pt's cousin expresses support of Pt and agrees to be involved in care after discharge.   Gladstone Lighter 01/30/2017, 3:36 PM

## 2017-01-30 NOTE — BHH Group Notes (Signed)
LCSW Group Therapy Note  01/30/2017 1:15pm  Type of Therapy/Topic:  Group Therapy:  Balance in Life  Participation Level:  Active  Description of Group:    This group will address the concept of balance and how it feels and looks when one is unbalanced. Patients will be encouraged to process areas in their lives that are out of balance and identify reasons for remaining unbalanced. Facilitators will guide patients in utilizing problem-solving interventions to address and correct the stressor making their life unbalanced. Understanding and applying boundaries will be explored and addressed for obtaining and maintaining a balanced life. Patients will be encouraged to explore ways to assertively make their unbalanced needs known to significant others in their lives, using other group members and facilitator for support and feedback.  Therapeutic Goals: 1. Patient will identify two or more emotions or situations they have that consume much of in their lives. 2. Patient will identify signs/triggers that life has become out of balance:  3. Patient will identify two ways to set boundaries in order to achieve balance in their lives:  4. Patient will demonstrate ability to communicate their needs through discussion and/or role plays  Summary of Patient Progress: Pt was active in group discussion, identifying a need for more healthy supports in order to create balance. She was engaged with peers and offered support when needed.    Therapeutic Modalities:   Cognitive Behavioral Therapy Solution-Focused Therapy Assertiveness Training  Gladstone Lighter, LCSW 01/30/2017 3:42 PM

## 2017-01-31 LAB — BASIC METABOLIC PANEL
Anion gap: 10 (ref 5–15)
BUN: 9 mg/dL (ref 6–20)
CHLORIDE: 103 mmol/L (ref 101–111)
CO2: 26 mmol/L (ref 22–32)
Calcium: 9.1 mg/dL (ref 8.9–10.3)
Creatinine, Ser: 0.61 mg/dL (ref 0.44–1.00)
GFR calc non Af Amer: >60 mL/min (ref >60)
Glucose, Bld: 107 mg/dL — ABNORMAL HIGH (ref 65–99)
Potassium: 3.1 mmol/L — ABNORMAL LOW (ref 3.5–5.1)
SODIUM: 139 mmol/L (ref 135–145)

## 2017-01-31 MED ORDER — LEVOTHYROXINE SODIUM 100 MCG PO TABS: 100.0000 ug | ORAL_TABLET | Freq: Every day | ORAL | 0 refills | Status: DC

## 2017-01-31 MED ORDER — HYDROXYZINE HCL 25 MG PO TABS: 25.0000 mg | ORAL_TABLET | Freq: Three times a day (TID) | ORAL | 0 refills | Status: DC | PRN

## 2017-01-31 MED ORDER — TRAZODONE HCL 50 MG PO TABS: 50.0000 mg | ORAL_TABLET | Freq: Every evening | ORAL | 0 refills | Status: DC | PRN

## 2017-01-31 MED ORDER — ARIPIPRAZOLE 10 MG PO TABS: 10.0000 mg | ORAL_TABLET | Freq: Every day | ORAL | 0 refills | Status: DC

## 2017-01-31 MED ORDER — AMLODIPINE BESYLATE 10 MG PO TABS: 10.0000 mg | ORAL_TABLET | Freq: Every day | ORAL | 0 refills | Status: DC

## 2017-01-31 MED ORDER — HYDROCHLOROTHIAZIDE 12.5 MG PO CAPS: 12.5000 mg | ORAL_CAPSULE | Freq: Every day | ORAL | 0 refills | Status: DC

## 2017-01-31 NOTE — Progress Notes (Signed)
  [facility] Adult Case Management Discharge Plan :  Will you be returning to the same living situation after discharge:  Yes,  pt returning home At discharge, do you have transportation home?: Yes,  Pt family to pick up Do you have the ability to pay for your medications: Yes,  Pt provided with samples and prescriptions  Release of information consent forms completed and in the chart;  Patient's signature needed at discharge.  Patient to Follow up at: Follow-up Information    [facility] Follow up on 02/05/2017.   Why:  Therapy appointment @2pm  with [name]. This will likely be your last session with Mel Almond because your insurance has lapsed. Please continue to be seen at [facility] for all other therapy needs. Contact information: [address],  [place], [place] [address] P: [phone] F: [place] Follow up on 02/04/2017.   Why:  at 8:00am for your hospital discharge appointment. Please bring a photo ID and hospital discharge paperwork. Contact information: [place] [address] [phone]           Next level of care provider has access to [place] and Suicide Prevention discussed: Yes,  with cousin; see SPE note  Have you used any form of tobacco in the last 30 days? (Cigarettes, Smokeless Tobacco, Cigars, and/or Pipes): No  Has patient been referred to the Quitline?: N/A patient is not a smoker  Patient has been referred for addiction treatment: Yes  Gladstone Lighter, LCSW 01/31/2017, 9:41 AM

## 2017-01-31 NOTE — [facility] Suicide Risk Assessment (Signed)
[facility] Discharge Suicide Risk Assessment   Principal Problem: MDD (major depressive disorder), recurrent severe, without psychosis ([place]) Discharge Diagnoses:  Patient Active Problem List   Diagnosis Date Noted  . MDD (major depressive disorder), recurrent severe, without psychosis ([place]) [F33.2] 01/28/2017  . S/P total knee replacement [Z96.659] 10/11/2015  . Osteoarthritis of knee [M17.10] 09/27/2013  . 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 Hypothyroidism [E03.9] 09/14/2007  . Allergic rhinitis [J30.9] 03/04/2006    Total Time spent with patient: 30 minutes  Musculoskeletal: Strength & Muscle Tone: within normal limits Gait & Station: normal Patient leans: N/A  Psychiatric Specialty Exam: ROS  no headache,denies chest pain, no shortness of breath, no vomiting   Blood pressure 123/73, pulse (!) 118, temperature 97.7 F (36.5 C), temperature source Oral, resp. rate 16, height 5' 2.25" (1.581 m), weight 115.2 kg (254 lb), SpO2 99 %.Body mass index is 46.08 kg/m.  General Appearance: Well Groomed  Eye Contact::  Good  Speech:  Normal Rate409  Volume:  Normal  Mood:  reports she is feeling better, denies feeling depressed today  Affect:  appropriate, reactive, mildly anxious  Thought Process:  Linear and Descriptions of Associations: Intact  Orientation:  Full (Time, Place, and Person)  Thought Content:  no hallucinations, no delusions , not internally preoccupied   Suicidal Thoughts:  No denies any suicidal or self injurious ideations, denies any homicidal or violent ideations  Homicidal Thoughts:  No  Memory:  recent and remote grossly intact   Judgement:  Other:  improving   Insight:  improving   Psychomotor Activity:  Normal  Concentration:  Good  Recall:  Good  Fund of Knowledge:Good  Language: Good  Akathisia:  Negative  Handed:  Right  AIMS (if indicated):     Assets:  Communication Skills Resilience  Sleep:  Number of Hours: 5.75  Cognition: WNL   ADL's:  Intact   Mental Status Per Nursing Assessment::   On Admission:  Suicidal ideation indicated by patient  Demographic Factors:  46 year old female , single, lives alone , currently unemployed   Loss Factors: Unemployment, limited resources, daughter has legal issues   Historical Factors: No prior psychiatric admissions , history of prior suicide attempts by overdosing   Risk Reduction Factors:   Sense of responsibility to family and Positive coping skills or problem solving skills  Continued Clinical Symptoms:  At this time patient is alert, attentive, well related, calm, describes mood as improved and today minimizes depression, states she is feeling better than before admission. Affect more reactive, no thought disorder, no suicidal or self injurious ideations, no homicidal ideations, no hallucinations, no delusions, future oriented . Denies medication side effects. Behavior on unit calm and in good control. Pleasant on approach. Of note, patient has history of chronic hypokalemia, and her K+ today remains low ( 3.1) but improved compared to prior . Denies associated symptoms, plans to follow up with her PCP regarding the above    Cognitive Features That Contribute To Risk:  No gross cognitive deficits noted upon discharge. Is alert , attentive, and oriented x 3   Suicide Risk:  Mild:  Suicidal ideation of limited frequency, intensity, duration, and specificity.  There are no identifiable plans, no associated intent, mild dysphoria and related symptoms, good self-control (both objective and subjective assessment), few other risk factors, and identifiable protective factors, including available and accessible social support.  Follow-up Information    [facility] Follow up on 02/05/2017.   Why:  Therapy appointment @2pm   with [name]. This will likely be your last session with Mel Almond because your insurance has lapsed. Please continue to be seen at [facility] for all  other therapy needs. Contact information: [address],  [place], [place] [address] P: [phone] F: [place] Follow up on 02/04/2017.   Why:  at 8:00am for your hospital discharge appointment. Please bring a photo ID and hospital discharge paperwork. Contact information: [place] [address] [phone]           Plan Of Care/Follow-up recommendations:  Activity:  as tolerated  Diet:  Regular Tests:  NA Other:  see below  Patient is leaving unit in good spirits  Plans to return home Plans to follow up as above  She also has an established PCP - Dr. Olivia Mackie- at [facility]- patient aware that she has history of hypokalemia, denies associated symptoms, states that she takes supplements at home.   Jenne Campus, MD 01/31/2017, 9:22 AM

## 2017-01-31 NOTE — Progress Notes (Signed)
Recreation Therapy Notes  Date: 01/31/17 Time: 0930 Location: [address] Group Room  Group Topic: Stress Management  Goal Area(s) Addresses:  Patient will verbalize importance of using healthy stress management.  Patient will identify positive emotions associated with healthy stress management.   Intervention: Stress Management  Activity :  Meditation.  LRT introduced the stress management technique of meditation.  LRT played a meditation from the Calm app to allow patients to engage in meditation to focus on trying to center their thoughts.  Education:  Stress Management, Discharge Planning.   Education Outcome: Acknowledges edcuation/In group clarification offered/Needs additional education  Clinical Observations/Feedback: Pt did not attend group.   Victorino Sparrow, LRT/CTRS         Victorino Sparrow A 01/31/2017 12:57 PM
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
## 2017-01-31 NOTE — Tx Team (Signed)
Interdisciplinary Treatment and Diagnostic Plan Update  01/31/2017 Time of Session: 9:30am Candice Watkins MRN: [id number]  Principal Diagnosis: MDD (major depressive disorder), recurrent severe, without psychosis ([place])  Secondary Diagnoses: Principal Problem:   MDD (major depressive disorder), recurrent severe, without psychosis ([place])   Current Medications:  Current Facility-Administered Medications  Medication Dose Route Frequency Provider Last Rate Last Dose  . alum & mag hydroxide-simeth (MAALOX/MYLANTA) 200-200-20 MG/5ML suspension 30 mL  30 mL Oral Q6H PRN Okonkwo, Justina A, NP      . amLODipine (NORVASC) tablet 10 mg  10 mg Oral Daily Okonkwo, Justina A, NP   10 mg at 01/31/17 0802  . ARIPiprazole (ABILIFY) tablet 10 mg  10 mg Oral Daily Money, Lowry Ram, FNP   10 mg at 01/31/17 0803  . hydrochlorothiazide (MICROZIDE) capsule 12.5 mg  12.5 mg Oral Daily Okonkwo, Justina A, NP   12.5 mg at 01/31/17 0803  . hydrOXYzine (ATARAX/VISTARIL) tablet 25 mg  25 mg Oral TID PRN Lu Duffel, Justina A, NP   25 mg at 01/30/17 2105  . levothyroxine (SYNTHROID, LEVOTHROID) tablet 100 mcg  100 mcg Oral QAC breakfast Okonkwo, Justina A, NP   100 mcg at 01/31/17 0605  . loratadine (CLARITIN) tablet 10 mg  10 mg Oral Daily Okonkwo, Justina A, NP   10 mg at 01/31/17 0803  . magnesium hydroxide (MILK OF MAGNESIA) suspension 30 mL  30 mL Oral Daily PRN Okonkwo, Justina A, NP      . traZODone (DESYREL) tablet 50 mg  50 mg Oral QHS PRN Okonkwo, Justina A, NP   50 mg at 01/30/17 2105    PTA Medications: Prescriptions Prior to Admission  Medication Sig Dispense Refill Last Dose  . amLODipine (NORVASC) 10 MG tablet Take 10 mg by mouth daily.  1 01/26/2017 at Unknown time  . cetirizine (ZYRTEC) 10 MG chewable tablet Chew 10 mg by mouth at bedtime.   01/25/2017 at Unknown time  . hydrochlorothiazide (MICROZIDE) 12.5 MG capsule Take 12.5 mg by mouth daily.   01/26/2017 at Unknown time  . levothyroxine (SYNTHROID,  LEVOTHROID) 100 MCG tablet Take 100 mcg by mouth daily.  2 01/26/2017 at 0800    Treatment Modalities: Medication Management, Group therapy, Case management,  1 to 1 session with clinician, Psychoeducation, Recreational therapy.  Patient Stressors:    Patient Strengths:    Physician Treatment Plan for Primary Diagnosis: MDD (major depressive disorder), recurrent severe, without psychosis ([place]) Long Term Goal(s): Improvement in symptoms so as ready for discharge  Short Term Goals: Ability to verbalize feelings will improve Ability to disclose and discuss suicidal ideas Compliance with prescribed medications will improve  Medication Management: Evaluate patient's response, side effects, and tolerance of medication regimen.  Therapeutic Interventions: 1 to 1 sessions, Unit Group sessions and Medication administration.  Evaluation of Outcomes: Adequate for Discharge  Physician Treatment Plan for Secondary Diagnosis: Principal Problem:   MDD (major depressive disorder), recurrent severe, without psychosis ([place])   Long Term Goal(s): Improvement in symptoms so as ready for discharge  Short Term Goals: Ability to verbalize feelings will improve Ability to disclose and discuss suicidal ideas Compliance with prescribed medications will improve  Medication Management: Evaluate patient's response, side effects, and tolerance of medication regimen.  Therapeutic Interventions: 1 to 1 sessions, Unit Group sessions and Medication administration.  Evaluation of Outcomes: Adequate for Discharge   RN Treatment Plan for Primary Diagnosis: MDD (major depressive disorder), recurrent severe, without psychosis ([place]) Long Term Goal(s): Knowledge of disease and  therapeutic regimen to maintain health will improve  Short Term Goals: Ability to disclose and discuss suicidal ideas, Ability to identify and develop effective coping behaviors will improve and Compliance with prescribed medications will  improve  Medication Management: RN will administer medications as ordered by provider, will assess and evaluate patient's response and provide education to patient for prescribed medication. RN will report any adverse and/or side effects to prescribing provider.  Therapeutic Interventions: 1 on 1 counseling sessions, Psychoeducation, Medication administration, Evaluate responses to treatment, Monitor vital signs and CBGs as ordered, Perform/monitor CIWA, COWS, AIMS and Fall Risk screenings as ordered, Perform wound care treatments as ordered.  Evaluation of Outcomes: Adequate for Discharge   LCSW Treatment Plan for Primary Diagnosis: MDD (major depressive disorder), recurrent severe, without psychosis ([place]) Long Term Goal(s): Safe transition to appropriate next level of care at discharge, Engage patient in therapeutic group addressing interpersonal concerns.  Short Term Goals: Engage patient in aftercare planning with referrals and resources, Identify triggers associated with mental health/substance abuse issues and Increase skills for wellness and recovery  Therapeutic Interventions: Assess for all discharge needs, 1 to 1 time with Social worker, Explore available resources and support systems, Assess for adequacy in community support network, Educate family and significant other(s) on suicide prevention, Complete Psychosocial Assessment, Interpersonal group therapy.  Evaluation of Outcomes: Adequate for Discharge   Progress in Treatment: Attending groups: Yes Participating in groups: Yes Taking medication as prescribed: Yes, MD continues to assess for medication changes as needed Toleration medication: Yes, no side effects reported at this time Family/Significant other contact made: Yes with cousin Patient understands diagnosis: Yes AEB willingness to participate in treatment Discussing patient identified problems/goals with staff: Yes Medical problems stabilized or resolved: Yes Denies  suicidal/homicidal ideation: Yes Issues/concerns per patient self-inventory: None Other: N/A  New problem(s) identified: None identified at this time.   New Short Term/Long Term Goal(s): None identified at this time.   Discharge Plan or Barriers: Pt will return home and follow-up with outpatient services with [facility]  Reason for Continuation of Hospitalization: None identified at this time.   Estimated Length of Stay: 0 days; Pt stable for DC today  Attendees: Patient:  01/31/2017  9:36 AM  Physician: Dr. Parke Poisson, MD 01/31/2017  9:36 AM  Nursing: Kieth Brightly, RN 01/31/2017  9:36 AM  RN Care Manager:  01/31/2017  9:36 AM  Social Worker: Adriana Reams, LCSW 01/31/2017  9:36 AM  Recreational Therapist:  01/31/2017  9:36 AM  Other: Lindell Spar, NP; Marvia Pickles, NP 01/31/2017  9:36 AM  Other:  01/31/2017  9:36 AM  Other: 01/31/2017  9:36 AM    Scribe for Treatment Team: Gladstone Lighter, LCSW 01/31/2017 9:36 AM

## 2017-01-31 NOTE — Progress Notes (Signed)
Data. Patient denies SI/HI/AVH. Verbally contracts for safety on the unit and to come to staff before acting of any self harm thoughts/feelings.  Patient interacting well with staff and other patients.  Action. Emotional support and encouragement offered. Education provided on medication, indications and side effect. Q 15 minute checks done for safety. Response. Safety on the unit maintained through 15 minute checks.  Medications taken as prescribed. Attended groups. Remained calm and appropriate through out shift.  Pt. discharged to lobby.  Belongings sheet reviewed and signed by pt. and all belongings, including scripts and medication samples, sent home. Paperwork reviewed and pt. able to verbalize understanding of education. Pt. in no current distress and ambulatory.

## 2017-01-31 NOTE — Plan of Care (Signed)
Problem: Activity: Goal: Interest or engagement in activities will improve Outcome: Progressing Pt has been visible in milieu interacting with peers appropriately.  She attended evening group tonight.

## 2017-07-21 ENCOUNTER — Other Ambulatory Visit: Payer: Self-pay | Admitting: Internal Medicine

## 2017-07-21 DIAGNOSIS — Z1239 Encounter for other screening for malignant neoplasm of breast: Secondary | ICD-10-CM

## 2017-09-10 ENCOUNTER — Ambulatory Visit: Payer: Medicaid Other

## 2017-10-29 ENCOUNTER — Ambulatory Visit: Payer: Self-pay | Attending: Oncology

## 2018-03-11 IMAGING — CR DG ABDOMEN ACUTE W/ 1V CHEST
3 series · 3 of 3 positions shown · non-contrast
Comparison: 12/12/2008 .

CLINICAL DATA: Leg swelling.  Nausea.

EXAM:
DG ABDOMEN ACUTE W/ 1V CHEST

[chest pa]
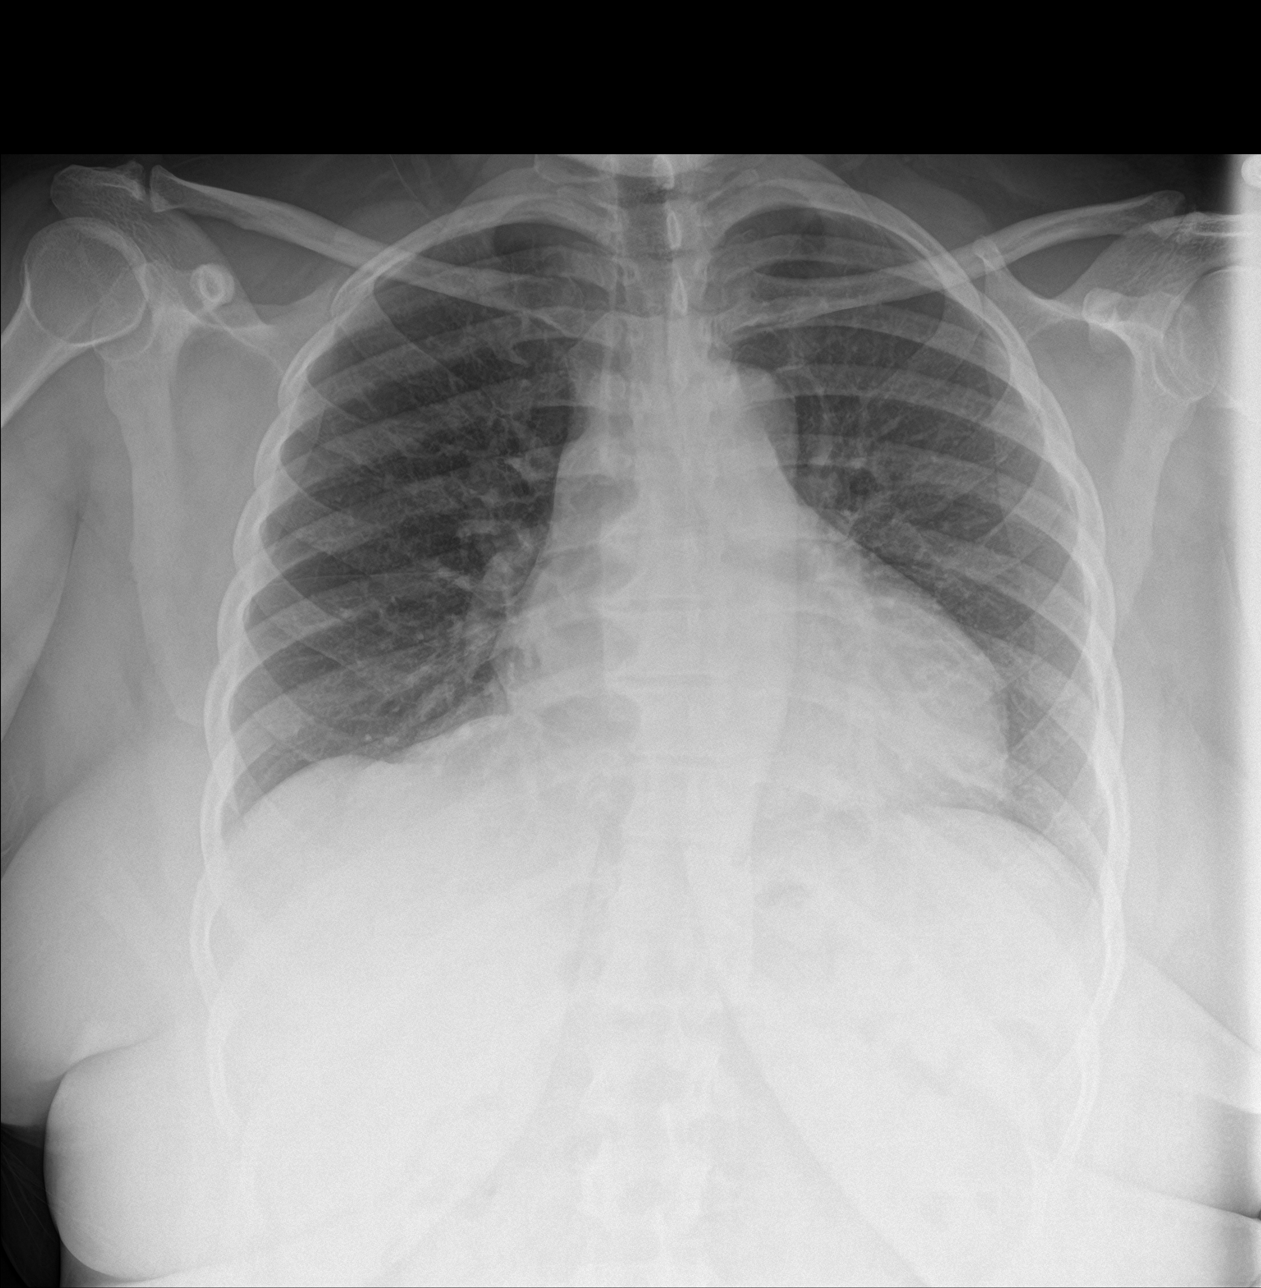

[abdomen erect]
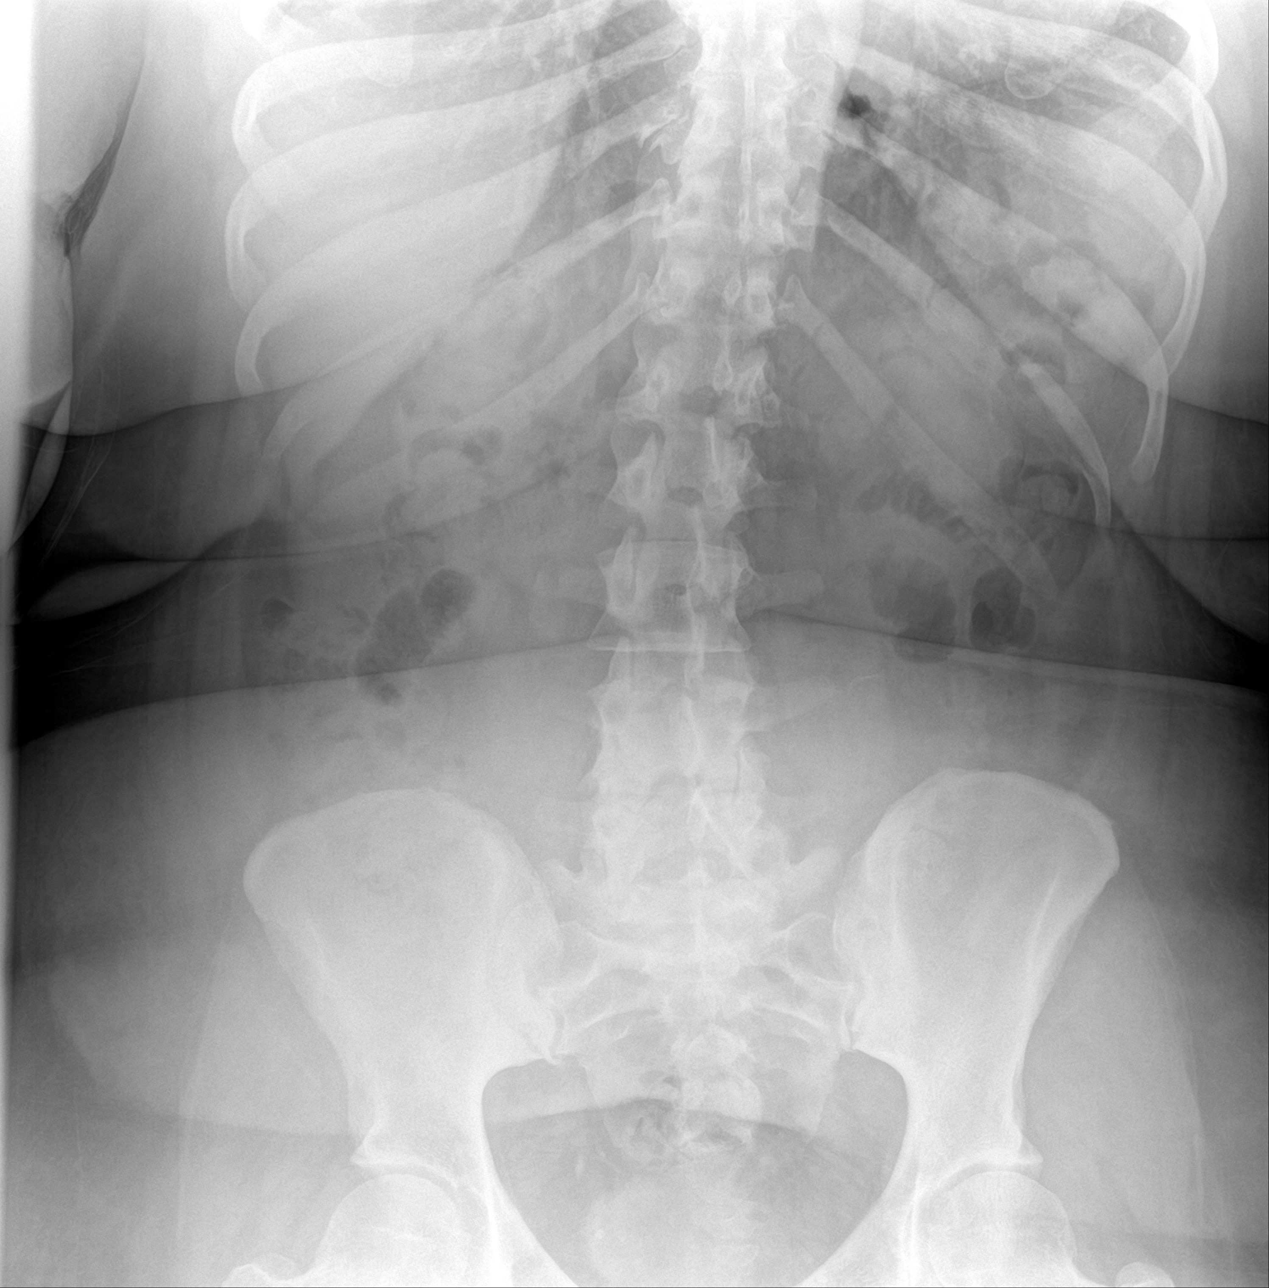

[abdomen supine]
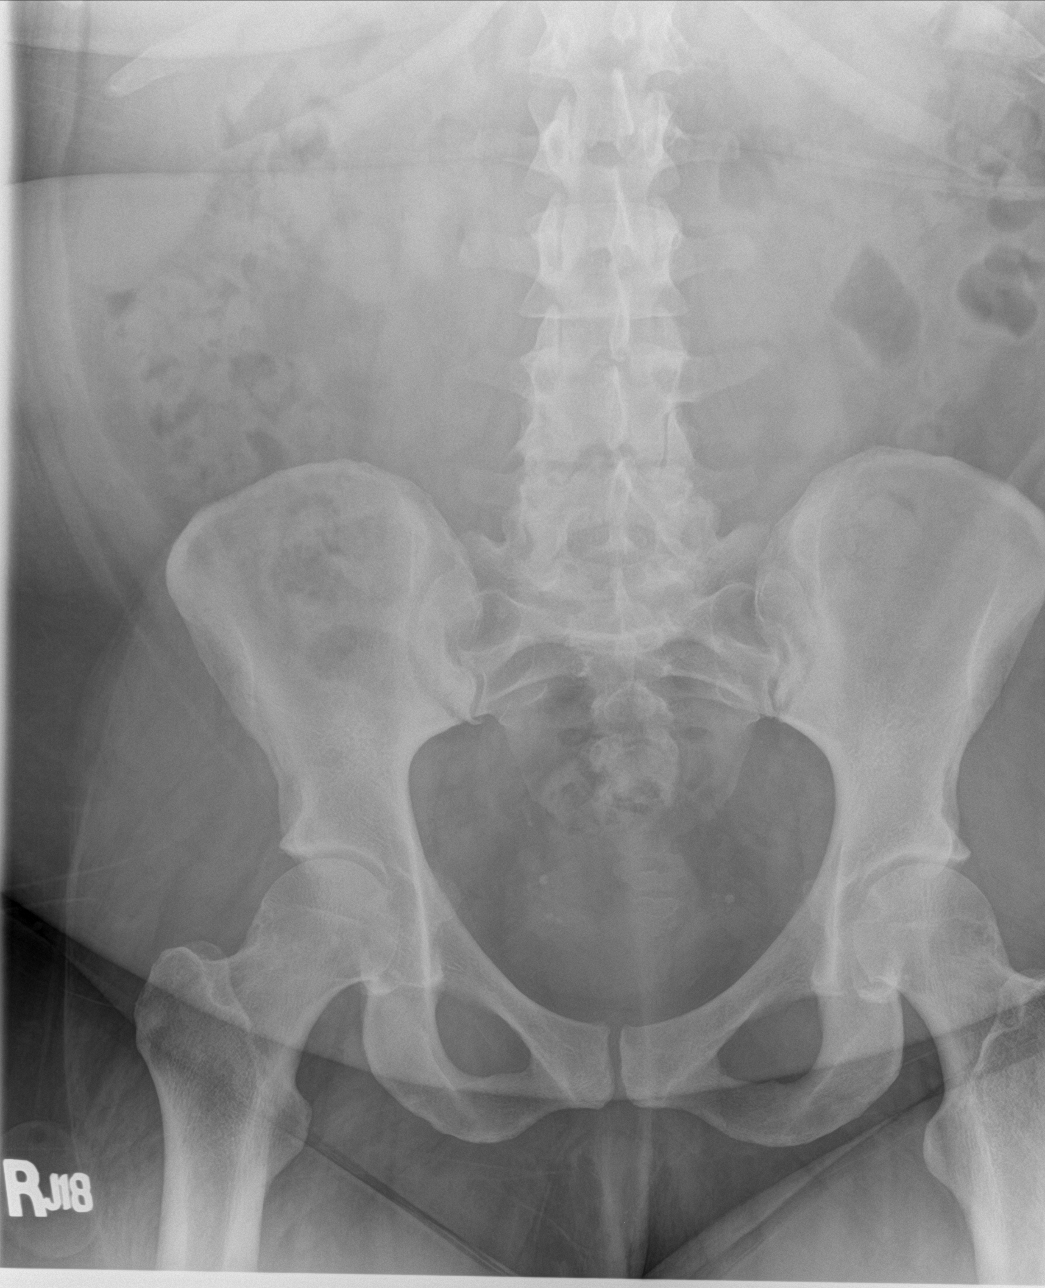

[3 of 3 positions shown; findings below may reference images not displayed]

FINDINGS: Mediastinum hilar structures normal. Cardiomegaly with normal
pulmonary vascularity. Soft tissue structures the abdomen are
unremarkable. Nonspecific air-filled loops of small and large bowel
are noted. Stool noted throughout colon no free air.
IMPRESSION: 1.  Cardiomegaly, no pulmonary venous congestion.

2. Nondilated air-filled loops of small and large bowel are noted.
Follow-up exam can be obtained to exclude developing bowel
distention. Stool noted throughout the colon.

## 2018-04-13 ENCOUNTER — Other Ambulatory Visit: Payer: Self-pay

## 2018-04-13 ENCOUNTER — Emergency Department
Admission: EM | Admit: 2018-04-13 | Discharge: 2018-04-13 | Disposition: A | Discharge status: Home / Self Care | Payer: Medicaid Other | Attending: Emergency Medicine | Admitting: Emergency Medicine

## 2018-04-13 ENCOUNTER — Encounter: Payer: Self-pay | Admitting: Emergency Medicine

## 2018-04-13 DIAGNOSIS — I10 Essential (primary) hypertension: Secondary | ICD-10-CM | POA: Insufficient documentation

## 2018-04-13 DIAGNOSIS — E039 Hypothyroidism, unspecified: Secondary | ICD-10-CM | POA: Insufficient documentation

## 2018-04-13 DIAGNOSIS — L02211 Cutaneous abscess of abdominal wall: Secondary | ICD-10-CM | POA: Insufficient documentation

## 2018-04-13 DIAGNOSIS — Z79899 Other long term (current) drug therapy: Secondary | ICD-10-CM | POA: Insufficient documentation

## 2018-04-13 DIAGNOSIS — R1903 Right lower quadrant abdominal swelling, mass and lump: Secondary | ICD-10-CM | POA: Diagnosis present

## 2018-04-13 MED ORDER — CLINDAMYCIN HCL 150 MG PO CAPS
300.0000 mg | ORAL_CAPSULE | Freq: Once | ORAL | Status: AC
  Administered 2018-04-13: 300 mg via ORAL
  Filled 2018-04-13: qty 2

## 2018-04-13 MED ORDER — LIDOCAINE HCL (PF) 1 % IJ SOLN
5.0000 mL | Freq: Once | INTRAMUSCULAR | Status: AC
  Administered 2018-04-13: 5 mL
  Filled 2018-04-13: qty 5

## 2018-04-13 MED ORDER — CLINDAMYCIN HCL 300 MG PO CAPS: 300.0000 mg | ORAL_CAPSULE | Freq: Four times a day (QID) | ORAL | 0 refills | Status: DC

## 2018-04-13 NOTE — ED Notes (Signed)
See triage note   Presents with possible abscess area to right lower abd  States she thinks she may have been stung or bitten by a spider  Large red, swollen area noted

## 2018-04-13 NOTE — ED Triage Notes (Signed)
Pt in via POV with possible abscess to right lower abdomen, pt reports possible spider bite, noticing area last Thursday.  Pt reports serosanguinous exudate from site.  Site dry at this time.

## 2018-04-13 NOTE — ED Provider Notes (Signed)
[facility] Emergency Department Provider Note  ____________________________________________  Time seen: Approximately 6:34 PM  I have reviewed the triage vital signs and the nursing notes.   HISTORY  Chief Complaint Abscess    HPI Candice Watkins is a 47 y.o. female who presents the emergency department with an edematous, painful lesion to the right lower abdomen.  Patient states that approximately 4 days ago she noticed a erythematous, edematous skin lesion to the right lower quadrant.  Initially she believes she may have been bit by a spider.  She tried to treat conservatively with warm compresses, Tylenol Motrin.  She reports that area is enlarging.  No drainage.  From the site.  No systemic complaints of fevers or chills, abdominal pain, nausea or vomiting.  No history of recurrent skin lesions.   Patient does have a history of asthma, hyperlipidemia, hypertension, hypothyroidism, migraines, arthritis.  No complaints of chronic medical problems at this time.   Past Medical History:  Diagnosis Date  . Anxiety   . Arthritis    "knees, ankles" (10/11/2015)  . Asthma   . Depression   . Heartburn   . HLD (hyperlipidemia)   . Hypertension   . Hypothyroidism   . Kidney stones   . Migraine    "monthly" (10/11/2015)  . PONV (postoperative nausea and vomiting) X 1  . Stomach ulcer     Patient Active Problem List   Diagnosis Date Noted  . MDD (major depressive disorder), recurrent severe, without psychosis ([place]) 01/28/2017  . S/P total knee replacement 10/11/2015  . Osteoarthritis of knee 09/27/2013  . 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 Hypothyroidism 09/14/2007  . Allergic rhinitis 03/04/2006    Past Surgical History:  Procedure Laterality Date  . BREAST BIOPSY Bilateral    neg- core  . CESAREAN SECTION  2000  . JOINT REPLACEMENT    . KNEE ARTHROSCOPY Right   . THYROID SURGERY     "removed tumor"  . TOTAL KNEE ARTHROPLASTY Right  10/11/2015  . TOTAL KNEE ARTHROPLASTY Right 10/11/2015   Procedure: TOTAL KNEE ARTHROPLASTY;  Surgeon: Ninetta Lights, MD;  Location: [facility];  Service: Orthopedics;  Laterality: Right;  . TUBAL LIGATION    . VAGINAL HYSTERECTOMY      Prior to Admission medications   Medication Sig Start Date End Date Taking? Authorizing Provider  amLODipine (NORVASC) 10 MG tablet Take 1 tablet (10 mg total) by mouth daily. For high blood pressure 02/01/17   Money, Darnelle Maffucci B, FNP  ARIPiprazole (ABILIFY) 10 MG tablet Take 1 tablet (10 mg total) by mouth daily. For mood control 02/01/17   Money, Lowry Ram, FNP  cetirizine (ZYRTEC) 10 MG chewable tablet Chew 10 mg by mouth at bedtime.    [provider]  clindamycin (CLEOCIN) 300 MG capsule Take 1 capsule (300 mg total) by mouth 4 (four) times daily. 04/13/18   Lauree Yurick, Charline Bills, PA-C  hydrochlorothiazide (MICROZIDE) 12.5 MG capsule Take 1 capsule (12.5 mg total) by mouth daily. For high blood pressure 02/01/17   Money, Darnelle Maffucci B, FNP  hydrOXYzine (ATARAX/VISTARIL) 25 MG tablet Take 1 tablet (25 mg total) by mouth 3 (three) times daily as needed for anxiety. 01/31/17   Money, Lowry Ram, FNP  levothyroxine (SYNTHROID, LEVOTHROID) 100 MCG tablet Take 1 tablet (100 mcg total) by mouth daily before breakfast. For hypothyroidism 02/01/17   Money, Lowry Ram, FNP  traZODone (DESYREL) 50 MG tablet Take 1 tablet (50 mg total) by mouth at bedtime as needed for sleep.  01/31/17   Money, Lowry Ram, FNP    Allergies Ibuprofen; Metronidazole; Phenyltoloxamine-acetaminophen; Other; and Septra [sulfamethoxazole-trimethoprim]  Family History  Problem Relation Age of Onset  . Breast cancer Neg Hx   . Kidney disease Neg Hx     Social History Social History   Tobacco Use  . Smoking status: Never Smoker  . Smokeless tobacco: Never Used  Substance Use Topics  . Alcohol use: Yes    Alcohol/week: 2.0 standard drinks    Types: 1 Glasses of wine, 1 Shots of liquor per week  .  Drug use: No     Review of Systems  Constitutional: No fever/chills Eyes: No visual changes. No discharge ENT: No upper respiratory complaints. Cardiovascular: no chest pain. Respiratory: no cough. No SOB. Gastrointestinal: No abdominal pain.  No nausea, no vomiting.  No diarrhea.  No constipation. Musculoskeletal: Negative for musculoskeletal pain. Skin: Positive for erythematous and edematous skin lesion to the right lower quadrant Neurological: Negative for headaches, focal weakness or numbness. 10-point ROS otherwise negative.  ____________________________________________   PHYSICAL EXAM:  VITAL SIGNS: ED Triage Vitals  Enc Vitals Group     BP 04/13/18 1726 (!) 150/103     Pulse Rate 04/13/18 1726 (!) 109     Resp --      Temp 04/13/18 1726 98.4 F (36.9 C)     Temp Source 04/13/18 1726 Oral     SpO2 04/13/18 1726 93 %     Weight 04/13/18 1727 280 lb (127 kg)     Height 04/13/18 1727 5\' 4"  (1.626 m)     Head Circumference --      Peak Flow --      Pain Score 04/13/18 1731 8     Pain Loc --      Pain Edu? --      Excl. in [place]? --      Constitutional: Alert and oriented. Well appearing and in no acute distress. Eyes: Conjunctivae are normal. PERRL. EOMI. Head: Atraumatic. Neck: No stridor.    Cardiovascular: Normal rate, regular rhythm. Normal S1 and S2.  Good peripheral circulation. Respiratory: Normal respiratory effort without tachypnea or retractions. Lungs CTAB. Good air entry to the bases with no decreased or absent breath sounds. Gastrointestinal: Bowel sounds 4 quadrants. Soft and nontender to palpation. No guarding or rigidity. No palpable masses. No distention. Musculoskeletal: Full range of motion to all extremities. No gross deformities appreciated. Neurologic:  Normal speech and language. No gross focal neurologic deficits are appreciated.  Skin:  Skin is warm, dry and intact. No rash noted.  Visualization of the right lower abdominal wall reveals  erythematous and edematous skin lesion measuring approximately 4 cm in diameter.  Area is indurated and fluctuant.  Palpation elicits tenderness.  No purulent drainage from site.  No surrounding erythema or edema. Psychiatric: Mood and affect are normal. Speech and behavior are normal. Patient exhibits appropriate insight and judgement.   ____________________________________________   LABS (all labs ordered are listed, but only abnormal results are displayed)  Labs Reviewed - No data to display ____________________________________________  EKG   ____________________________________________  RADIOLOGY   No results found.  ____________________________________________    PROCEDURES  Procedure(s) performed:    Marland KitchenMarland KitchenIncision and Drainage Date/Time: 04/13/2018 6:37 PM Performed by: Darletta Moll, PA-C Authorized by: Darletta Moll, PA-C   Consent:    Consent obtained:  Verbal   Consent given by:  Patient   Risks discussed:  Bleeding, incomplete drainage and pain Location:  Type:  Abscess   Size:  4 cm   Location:  Trunk   Trunk location:  Abdomen Pre-procedure details:    Skin preparation:  Betadine Anesthesia (see MAR for exact dosages):    Anesthesia method:  Local infiltration   Local anesthetic:  Lidocaine 1% w/o epi Procedure type:    Complexity:  Simple Procedure details:    Incision types:  Single straight   Incision depth:  Subcutaneous   Scalpel blade:  11   Wound management:  Probed and deloculated   Drainage:  Purulent and serosanguinous   Drainage amount:  Moderate   Wound treatment:  Wound left open   Packing materials:  None Post-procedure details:    Patient tolerance of procedure:  Tolerated well, no immediate complications      Medications  lidocaine (PF) (XYLOCAINE) 1 % injection 5 mL (has no administration in time range)  clindamycin (CLEOCIN) capsule 300 mg (has no administration in time range)      ____________________________________________   INITIAL IMPRESSION / ASSESSMENT AND PLAN / ED COURSE  Pertinent labs & imaging results that were available during my care of the patient were reviewed by me and considered in my medical decision making (see chart for details).  Review of the [place] CSRS was performed in accordance of the [place] prior to dispensing any controlled drugs.      Patient's diagnosis is consistent with abdominal wall abscess.  Patient presents the emerge department with an erythematous, edematous lesion to the right abdominal wall.  Exam was consistent with abscess.  No indication of intra-abdominal penetration.  Area was incised and drained as described above.  Patient tolerated well.  Patient was given first dose of oral antibiotic in the emergency department.  No indication for labs, imaging, IV antibiotics in the emergency department.. Patient will be discharged home with prescriptions for clindamycin as patient is allergic to Bactrim. Patient is to follow up with primary care as needed or otherwise directed. Patient is given ED precautions to return to the ED for any worsening or new symptoms.     ____________________________________________  FINAL CLINICAL IMPRESSION(S) / ED DIAGNOSES  Final diagnoses:  Abscess of abdominal wall      NEW MEDICATIONS STARTED DURING THIS VISIT:  ED Discharge Orders         Ordered    clindamycin (CLEOCIN) 300 MG capsule  4 times daily     04/13/18 1858              This chart was dictated using voice recognition software/Dragon. Despite best efforts to proofread, errors can occur which can change the meaning. Any change was purely unintentional.    Darletta Moll, PA-C 04/13/18 1859    Lavonia Drafts, MD 04/13/18 [phone]

## 2018-10-13 ENCOUNTER — Other Ambulatory Visit: Payer: Self-pay | Admitting: Family

## 2018-10-13 DIAGNOSIS — Z1231 Encounter for screening mammogram for malignant neoplasm of breast: Secondary | ICD-10-CM

## 2018-11-24 ENCOUNTER — Ambulatory Visit
Admission: RE | Admit: 2018-11-24 | Discharge: 2018-11-24 | Disposition: A | Discharge status: Home / Self Care | Payer: Medicare Other | Source: Ambulatory Visit | Attending: Family | Admitting: Family

## 2018-11-24 ENCOUNTER — Other Ambulatory Visit: Payer: Self-pay

## 2018-11-24 DIAGNOSIS — Z1231 Encounter for screening mammogram for malignant neoplasm of breast: Secondary | ICD-10-CM | POA: Diagnosis not present

## 2019-10-22 ENCOUNTER — Other Ambulatory Visit: Payer: Self-pay | Admitting: Internal Medicine

## 2019-10-22 DIAGNOSIS — Z1231 Encounter for screening mammogram for malignant neoplasm of breast: Secondary | ICD-10-CM

## 2019-12-21 ENCOUNTER — Ambulatory Visit
Admission: RE | Admit: 2019-12-21 | Discharge: 2019-12-21 | Disposition: A | Discharge status: Home / Self Care | Payer: Medicare Other | Source: Ambulatory Visit | Attending: Internal Medicine | Admitting: Internal Medicine

## 2019-12-21 ENCOUNTER — Other Ambulatory Visit: Payer: Self-pay

## 2019-12-21 DIAGNOSIS — Z1231 Encounter for screening mammogram for malignant neoplasm of breast: Secondary | ICD-10-CM | POA: Diagnosis present

## 2020-08-02 ENCOUNTER — Emergency Department (HOSPITAL_COMMUNITY): Payer: Medicare HMO

## 2020-08-02 ENCOUNTER — Emergency Department (HOSPITAL_COMMUNITY)
Admission: EM | Admit: 2020-08-02 | Discharge: 2020-08-02 | Disposition: A | Discharge status: Home / Self Care | Payer: Medicare HMO | Attending: Emergency Medicine | Admitting: Emergency Medicine

## 2020-08-02 ENCOUNTER — Encounter (HOSPITAL_COMMUNITY): Payer: Self-pay

## 2020-08-02 ENCOUNTER — Other Ambulatory Visit: Payer: Self-pay

## 2020-08-02 DIAGNOSIS — I10 Essential (primary) hypertension: Secondary | ICD-10-CM | POA: Diagnosis not present

## 2020-08-02 DIAGNOSIS — Z79899 Other long term (current) drug therapy: Secondary | ICD-10-CM | POA: Diagnosis not present

## 2020-08-02 DIAGNOSIS — E039 Hypothyroidism, unspecified: Secondary | ICD-10-CM | POA: Diagnosis not present

## 2020-08-02 DIAGNOSIS — J45909 Unspecified asthma, uncomplicated: Secondary | ICD-10-CM | POA: Diagnosis not present

## 2020-08-02 DIAGNOSIS — R350 Frequency of micturition: Secondary | ICD-10-CM | POA: Diagnosis not present

## 2020-08-02 DIAGNOSIS — Z96651 Presence of right artificial knee joint: Secondary | ICD-10-CM | POA: Diagnosis not present

## 2020-08-02 DIAGNOSIS — R109 Unspecified abdominal pain: Secondary | ICD-10-CM

## 2020-08-02 LAB — URINALYSIS, ROUTINE W REFLEX MICROSCOPIC
Bilirubin Urine: NEGATIVE
Glucose, UA: NEGATIVE mg/dL
Hgb urine dipstick: NEGATIVE
Ketones, ur: NEGATIVE mg/dL
Leukocytes,Ua: NEGATIVE
Nitrite: NEGATIVE
Protein, ur: NEGATIVE mg/dL
Specific Gravity, Urine: 1.002 — ABNORMAL LOW (ref 1.005–1.030)
pH: 6 (ref 5.0–8.0)

## 2020-08-02 LAB — CBC WITH DIFFERENTIAL/PLATELET
Abs Immature Granulocytes: 0.04 10*3/uL (ref 0.00–0.07)
Basophils Absolute: 0 10*3/uL (ref 0.0–0.1)
Basophils Relative: 1 %
Eosinophils Absolute: 0.2 10*3/uL (ref 0.0–0.5)
Eosinophils Relative: 3 %
HCT: 34.6 % — ABNORMAL LOW (ref 36.0–46.0)
Hemoglobin: 11.3 g/dL — ABNORMAL LOW (ref 12.0–15.0)
Immature Granulocytes: 1 %
Lymphocytes Relative: 22 %
Lymphs Abs: 2 10*3/uL (ref 0.7–4.0)
MCH: 26 pg (ref 26.0–34.0)
MCHC: 32.7 g/dL (ref 30.0–36.0)
MCV: 79.5 fL — ABNORMAL LOW (ref 80.0–100.0)
Monocytes Absolute: 0.5 10*3/uL (ref 0.1–1.0)
Monocytes Relative: 6 %
Neutro Abs: 6 10*3/uL (ref 1.7–7.7)
Neutrophils Relative %: 67 %
Platelets: 303 10*3/uL (ref 150–400)
RBC: 4.35 MIL/uL (ref 3.87–5.11)
RDW: 16.7 % — ABNORMAL HIGH (ref 11.5–15.5)
WBC: 8.8 10*3/uL (ref 4.0–10.5)
nRBC: 0 % (ref 0.0–0.2)

## 2020-08-02 LAB — COMPREHENSIVE METABOLIC PANEL
ALT: 11 U/L (ref 0–44)
AST: 22 U/L (ref 15–41)
Albumin: 3.3 g/dL — ABNORMAL LOW (ref 3.5–5.0)
Alkaline Phosphatase: 61 U/L (ref 38–126)
Anion gap: 7 (ref 5–15)
BUN: 9 mg/dL (ref 6–20)
CO2: 25 mmol/L (ref 22–32)
Calcium: 8.8 mg/dL — ABNORMAL LOW (ref 8.9–10.3)
Chloride: 105 mmol/L (ref 98–111)
Creatinine, Ser: 0.71 mg/dL (ref 0.44–1.00)
GFR, Estimated: >60 mL/min (ref >60)
Glucose, Bld: 112 mg/dL — ABNORMAL HIGH (ref 70–99)
Potassium: 3.6 mmol/L (ref 3.5–5.1)
Sodium: 137 mmol/L (ref 135–145)
Total Bilirubin: 1.2 mg/dL (ref 0.3–1.2)
Total Protein: 7.1 g/dL (ref 6.5–8.1)

## 2020-08-02 LAB — LIPASE, BLOOD: Lipase: 26 U/L (ref 11–51)

## 2020-08-02 MED ORDER — FENTANYL CITRATE (PF) 100 MCG/2ML IJ SOLN
50.0000 ug | Freq: Once | INTRAMUSCULAR | Status: AC
  Administered 2020-08-02: 50 ug via INTRAVENOUS
  Filled 2020-08-02: qty 2

## 2020-08-02 MED ORDER — METHOCARBAMOL 500 MG PO TABS: 500.0000 mg | ORAL_TABLET | Freq: Two times a day (BID) | ORAL | 0 refills | Status: DC

## 2020-08-02 NOTE — ED Provider Notes (Signed)
Care assumed from [facility], PA-C at shift change with labs pending.  In brief, this patient is a 50 y.o. F who presents for evaluation of left flank pain that has been ongoing for about 7 to 8 months.  She states that it has been constant for long stretches of time and then resolve and then returned.  Is not associate with any particular lifting, pulling or activity.  She took ibuprofen at home tonight with no improvement in symptoms.  She does have a history of kidney stones.  She denies any abdominal pain, nausea, vomiting, fever, vaginal discharge.  Please see note from previous provider for full history/physical exam.  Physical Exam  BP (!) 144/77   Pulse 83   Temp 98.5 F (36.9 C)   Resp (!) 27   Ht 5\' 4"  (1.626 m)   Wt 131.1 kg   SpO2 95%   BMI 49.61 kg/m   Physical Exam  Resting comfortably.  No signs of distress.  ED Course/Procedures   Clinical Course as of 08/02/20 0820  Wed Aug 02, 2020  0635 Hemoglobin(!): 11.3 Baseline [HM]  0636 Nitrite: NEGATIVE No UTI [HM]  0636 BP: 138/88 HTN resolved. [HM]    Clinical Course User Index [HM] Muthersbaugh, Gwenlyn Perking    Procedures  Results for orders placed or performed during the hospital encounter of 08/02/20 (from the past 24 hour(s))  Urinalysis, Routine w reflex microscopic Urine, Clean Catch     Status: Abnormal   Collection Time: 08/02/20  5:14 AM  Result Value Ref Range   Color, Urine STRAW (A) YELLOW   APPearance CLEAR CLEAR   Specific Gravity, Urine 1.002 (L) 1.005 - 1.030   pH 6.0 5.0 - 8.0   Glucose, UA NEGATIVE NEGATIVE mg/dL   Hgb urine dipstick NEGATIVE NEGATIVE   Bilirubin Urine NEGATIVE NEGATIVE   Ketones, ur NEGATIVE NEGATIVE mg/dL   Protein, ur NEGATIVE NEGATIVE mg/dL   Nitrite NEGATIVE NEGATIVE   Leukocytes,Ua NEGATIVE NEGATIVE  CBC with Differential/Platelet     Status: Abnormal   Collection Time: 08/02/20  5:39 AM  Result Value Ref Range   WBC 8.8 4.0 - 10.5 K/uL   RBC 4.35 3.87  - 5.11 MIL/uL   Hemoglobin 11.3 (L) 12.0 - 15.0 g/dL   HCT 34.6 (L) 36.0 - 46.0 %   MCV 79.5 (L) 80.0 - 100.0 fL   MCH 26.0 26.0 - 34.0 pg   MCHC 32.7 30.0 - 36.0 g/dL   RDW 16.7 (H) 11.5 - 15.5 %   Platelets 303 150 - 400 K/uL   nRBC 0.0 0.0 - 0.2 %   Neutrophils Relative % 67 %   Neutro Abs 6.0 1.7 - 7.7 K/uL   Lymphocytes Relative 22 %   Lymphs Abs 2.0 0.7 - 4.0 K/uL   Monocytes Relative 6 %   Monocytes Absolute 0.5 0.1 - 1.0 K/uL   Eosinophils Relative 3 %   Eosinophils Absolute 0.2 0.0 - 0.5 K/uL   Basophils Relative 1 %   Basophils Absolute 0.0 0.0 - 0.1 K/uL   Immature Granulocytes 1 %   Abs Immature Granulocytes 0.04 0.00 - 0.07 K/uL  Comprehensive metabolic panel     Status: Abnormal   Collection Time: 08/02/20  6:51 AM  Result Value Ref Range   Sodium 137 135 - 145 mmol/L   Potassium 3.6 3.5 - 5.1 mmol/L   Chloride 105 98 - 111 mmol/L   CO2 25 22 - 32 mmol/L   Glucose, Bld 112 (H)  70 - 99 mg/dL   BUN 9 6 - 20 mg/dL   Creatinine, Ser 0.71 0.44 - 1.00 mg/dL   Calcium 8.8 (L) 8.9 - 10.3 mg/dL   Total Protein 7.1 6.5 - 8.1 g/dL   Albumin 3.3 (L) 3.5 - 5.0 g/dL   AST 22 15 - 41 U/L   ALT 11 0 - 44 U/L   Alkaline Phosphatase 61 38 - 126 U/L   Total Bilirubin 1.2 0.3 - 1.2 mg/dL   GFR, Estimated >60 >60 mL/min   Anion gap 7 5 - 15  Lipase, blood     Status: None   Collection Time: 08/02/20  6:51 AM  Result Value Ref Range   Lipase 26 11 - 51 U/L     MDM    PLAN: CT renal study negative.  If labs unremarkable, can be discharged home on muscle relaxers.  MDM: Lipase is normal.  CBC shows no leukocytosis.  Hemoglobin is 11.3.  CMP shows BUN and creatinine.  UA negative for any infectious etiology.  CT renal study shows no acute findings.  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  Question if this could be contributing to her pain.  Kidneys unremarkable.  There is a lipomatous appearance of the proximal colonic wall measuring up to 3 cm.  This is an  incidental finding.  Discussed results with patient.  We will plan to send her home with muscle relaxers per previous providers recommendation.  I discussed with patient regarding following up with her primary care doctor. At this time, patient exhibits no emergent life-threatening condition that require further evaluation in ED. Patient had ample opportunity for questions and discussion. All patient's questions were answered with full understanding. Strict return precautions discussed. Patient expresses understanding and agreement to plan.   1. Left flank pain     Portions of this note were generated with Dragon dictation software. Dictation errors may occur despite best attempts at proofreading.     Volanda Napoleon, PA-C 94/58/59 [id number]    Delora Fuel, MD 46/28/63 2242

## 2020-08-02 NOTE — Discharge Instructions (Addendum)
As we discussed your your work-up looked reassuring.  As we discussed, your CT scan showed no evidence of kidney stone or kidney abnormalities.  He did have some disc degeneration noted back.  Additionally, as we discussed, there was a fatty appearance of the colon.  Please follow-up with your primary care doctor.  1. Medications: Alternate Tylenol and ibuprofen for pain control, usual home medications 2. Treatment: rest, drink plenty of fluids, advance diet slowly 3. Follow Up: Please followup with your primary doctor in 2 days for discussion of your diagnoses and further evaluation after today's visit; Please return to the ER for worsening pain, persistent vomiting, high fevers or worsening symptoms

## 2020-08-02 NOTE — ED Provider Notes (Signed)
[facility] EMERGENCY DEPARTMENT Provider Note   CSN: [id number] Arrival date & time: 08/02/20  0441     History No chief complaint on file.   Candice Watkins is a 50 y.o. female presents to the Emergency Department complaining of gradual, persistent, progressively worsening left flank pain onset 7-8 months ago.  Patient describes the pain as aching.  She reports it is constant for weeks at a time, resolve spontaneously and then returns.  She reports it is not associated with any sort of lifting, pulling or exercise.  She reports laying on her left side and her back make it worse, nothing makes it better.  Patient does report a history of kidney stones but states this is different from previous.  Patient denies falls or known trauma.  She is not on anticoagulant.  She denies abdominal pain, nausea, vomiting, fever, weakness, dizziness, syncope, vaginal discharge.  She does admit to associated urinary frequency but denies dysuria or hematuria.  She has not sought evaluation for this prior to tonight.  Patient reports she comes tonight because the pain became intense and she had difficulty sleeping.   The history is provided by the patient and medical records. No language interpreter was used.       Past Medical History:  Diagnosis Date  . Anxiety   . Arthritis    "knees, ankles" (10/11/2015)  . Asthma   . Depression   . Heartburn   . HLD (hyperlipidemia)   . Hypertension   . Hypothyroidism   . Kidney stones   . Migraine    "monthly" (10/11/2015)  . PONV (postoperative nausea and vomiting) X 1  . Stomach ulcer     Patient Active Problem List   Diagnosis Date Noted  . MDD (major depressive disorder), recurrent severe, without psychosis ([place]) 01/28/2017  . S/P total knee replacement 10/11/2015  . Osteoarthritis of knee 09/27/2013  . 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 Hypothyroidism 09/14/2007  . Allergic rhinitis 03/04/2006    Past Surgical  History:  Procedure Laterality Date  . BREAST BIOPSY Bilateral    neg- core  . CESAREAN SECTION  2000  . JOINT REPLACEMENT    . KNEE ARTHROSCOPY Right   . THYROID SURGERY     "removed tumor"  . TOTAL KNEE ARTHROPLASTY Right 10/11/2015  . TOTAL KNEE ARTHROPLASTY Right 10/11/2015   Procedure: TOTAL KNEE ARTHROPLASTY;  Surgeon: Ninetta Lights, MD;  Location: [facility];  Service: Orthopedics;  Laterality: Right;  . TUBAL LIGATION    . VAGINAL HYSTERECTOMY       OB History   No obstetric history on file.     Family History  Problem Relation Age of Onset  . Breast cancer Neg Hx   . Kidney disease Neg Hx     Social History   Tobacco Use  . Smoking status: Never Smoker  . Smokeless tobacco: Never Used  Vaping Use  . Vaping Use: Never used  Substance Use Topics  . Alcohol use: Yes    Alcohol/week: 2.0 standard drinks    Types: 1 Glasses of wine, 1 Shots of liquor per week  . Drug use: No    Home Medications Prior to Admission medications   Medication Sig Start Date End Date Taking? Authorizing Provider  methocarbamol (ROBAXIN) 500 MG tablet Take 1 tablet (500 mg total) by mouth 2 (two) times daily. 08/02/20  Yes Muthersbaugh, Jarrett Soho, PA-C  amLODipine (NORVASC) 10 MG tablet Take 1 tablet (10 mg total) by mouth daily.  For high blood pressure 02/01/17   Money, Darnelle Maffucci B, FNP  ARIPiprazole (ABILIFY) 10 MG tablet Take 1 tablet (10 mg total) by mouth daily. For mood control 02/01/17   Money, Lowry Ram, FNP  cetirizine (ZYRTEC) 10 MG chewable tablet Chew 10 mg by mouth at bedtime.    [provider]  clindamycin (CLEOCIN) 300 MG capsule Take 1 capsule (300 mg total) by mouth 4 (four) times daily. 04/13/18   Cuthriell, Charline Bills, PA-C  hydrochlorothiazide (MICROZIDE) 12.5 MG capsule Take 1 capsule (12.5 mg total) by mouth daily. For high blood pressure 02/01/17   Money, Darnelle Maffucci B, FNP  hydrOXYzine (ATARAX/VISTARIL) 25 MG tablet Take 1 tablet (25 mg total) by mouth 3 (three) times daily  as needed for anxiety. 01/31/17   Money, Lowry Ram, FNP  levothyroxine (SYNTHROID, LEVOTHROID) 100 MCG tablet Take 1 tablet (100 mcg total) by mouth daily before breakfast. For hypothyroidism 02/01/17   Money, Lowry Ram, FNP  traZODone (DESYREL) 50 MG tablet Take 1 tablet (50 mg total) by mouth at bedtime as needed for sleep. 01/31/17   Money, Lowry Ram, FNP    Allergies    Ibuprofen, Metronidazole, Phenyltoloxamine-acetaminophen, Other, and Septra [sulfamethoxazole-trimethoprim]  Review of Systems   Review of Systems  Constitutional: Negative for appetite change, diaphoresis, fatigue, fever and unexpected weight change.  HENT: Negative for mouth sores.   Eyes: Negative for visual disturbance.  Respiratory: Negative for cough, chest tightness, shortness of breath and wheezing.   Cardiovascular: Negative for chest pain.  Gastrointestinal: Negative for abdominal pain, constipation, diarrhea, nausea and vomiting.  Endocrine: Negative for polydipsia, polyphagia and polyuria.  Genitourinary: Positive for flank pain and frequency. Negative for dysuria, hematuria and urgency.  Musculoskeletal: Negative for back pain and neck stiffness.  Skin: Negative for rash.  Allergic/Immunologic: Negative for immunocompromised state.  Neurological: Negative for syncope, light-headedness and headaches.  Hematological: Does not bruise/bleed easily.  Psychiatric/Behavioral: Negative for sleep disturbance. The patient is not nervous/anxious.     Physical Exam Updated Vital Signs BP (!) 159/90   Pulse 88   Temp 98.5 F (36.9 C)   Resp (!) 22   Ht 5\' 4"  (1.626 m)   Wt 131.1 kg   SpO2 97%   BMI 49.61 kg/m   Physical Exam Vitals and nursing note reviewed.  Constitutional:      General: She is not in acute distress.    Appearance: She is not diaphoretic.  HENT:     Head: Normocephalic.  Eyes:     General: No scleral icterus.    Conjunctiva/sclera: Conjunctivae normal.  Cardiovascular:     Rate and  Rhythm: Normal rate and regular rhythm.     Pulses: Normal pulses.          Radial pulses are 2+ on the right side and 2+ on the left side.  Pulmonary:     Effort: No tachypnea, accessory muscle usage, prolonged expiration, respiratory distress or retractions.     Breath sounds: Normal breath sounds. No stridor.     Comments: Equal chest rise. No increased work of breathing. Abdominal:     General: Bowel sounds are normal. There is no distension.     Palpations: Abdomen is soft.     Tenderness: There is no abdominal tenderness. There is left CVA tenderness. There is no right CVA tenderness, guarding or rebound.  Musculoskeletal:     Cervical back: Normal range of motion.     Comments: Moves all extremities equally and without difficulty.  Skin:  General: Skin is warm and dry.     Capillary Refill: Capillary refill takes less than 2 seconds.  Neurological:     Mental Status: She is alert.     GCS: GCS eye subscore is 4. GCS verbal subscore is 5. GCS motor subscore is 6.     Comments: Speech is clear and goal oriented.  Psychiatric:        Mood and Affect: Mood normal.     ED Results / Procedures / Treatments   Labs (all labs ordered are listed, but only abnormal results are displayed) Labs Reviewed  URINALYSIS, ROUTINE W REFLEX MICROSCOPIC - Abnormal; Notable for the following components:      Result Value   Color, Urine STRAW (*)    Specific Gravity, Urine 1.002 (*)    All other components within normal limits  CBC WITH DIFFERENTIAL/PLATELET - Abnormal; Notable for the following components:   Hemoglobin 11.3 (*)    HCT 34.6 (*)    MCV 79.5 (*)    RDW 16.7 (*)    All other components within normal limits  COMPREHENSIVE METABOLIC PANEL  LIPASE, BLOOD     Radiology CT Renal Stone Study  Result Date: 08/02/2020 CLINICAL DATA:  Flank pain with kidney stone suspected EXAM: CT ABDOMEN AND PELVIS WITHOUT CONTRAST TECHNIQUE: Multidetector CT imaging of the abdomen and  pelvis was performed following the standard protocol without IV contrast. COMPARISON:  12/19/2015 FINDINGS: Lower chest: Cluster of small calcifications in the subpleural left lower lobe also seen in 2017 where the calcific nature was better depicted. No acute or significant finding. Hepatobiliary: No focal liver abnormality.No evidence of biliary obstruction or stone. Pancreas: Unremarkable. Spleen: Unremarkable. Adrenals/Urinary Tract: Negative adrenals. No hydronephrosis or stone. Unremarkable bladder. Stomach/Bowel: No obstruction. No appendicitis. Lipoma appearance in the proximal colonic wall measuring up to 3 cm, incidental. Vascular/Lymphatic: No acute vascular abnormality. Prominent inguinal and iliac lymph nodes which are stable and non worrisome. Reproductive:Hysterectomy. Other: No ascites or pneumoperitoneum. Musculoskeletal: No acute abnormalities. Lumbar facet degeneration and focal advanced L5-S1 disc degeneration. Lower thoracic spondylosis. IMPRESSION: No acute finding or explanation for symptoms. Electronically Signed   By: Monte Fantasia M.D.   On: 08/02/2020 05:43    Procedures Procedures   Medications Ordered in ED Medications  fentaNYL (SUBLIMAZE) injection 50 mcg (50 mcg Intravenous Given 08/02/20 0549)    ED Course  I have reviewed the triage vital signs and the nursing notes.  Pertinent labs & imaging results that were available during my care of the patient were reviewed by me and considered in my medical decision making (see chart for details).  Clinical Course as of 08/02/20 0639  Wed Aug 02, 2020  0635 Hemoglobin(!): 11.3 Baseline [HM]  0636 Nitrite: NEGATIVE No UTI [HM]  0636 BP: 138/88 HTN resolved. [HM]    Clinical Course User Index [HM] Muthersbaugh, Gwenlyn Perking   MDM Rules/Calculators/A&P                           Patient presents with 8 months of left-sided flank pain that has been intermittent but was severe tonight.  She does have a history of  nephrolithiasis.  Left CVA tenderness on exam.  Exam otherwise reassuring.  6:01 AM CT renal without hydronephrosis, mass, free fluid or other acute abnormality.  I personally evaluated these images.  Labs and urinalysis pending.  6:39 AM  CBC and UA are without acute abnormalities.  Suspect pain is more likely  musculoskeletal in nature.  CMP and lipase pending.  At shift change care was transferred to [facility] who will follow pending studies, re-evaulate and determine disposition.  Dissipate discharge home with close primary care follow-up.   Final Clinical Impression(s) / ED Diagnoses Final diagnoses:  Left flank pain    Rx / DC Orders ED Discharge Orders         Ordered    methocarbamol (ROBAXIN) 500 MG tablet  2 times daily        08/02/20 0635           Muthersbaugh, Jarrett Soho, PA-C 74/94/49 [id number]    Delora Fuel, MD 16/38/46 [phone]

## 2020-08-02 NOTE — ED Triage Notes (Addendum)
Pt reports that she has been having pain for the last 7-8 months. Describes an aching pain on the left flank area. Denies nausea, vomiting, fever. Reports urinary frequency.

## 2020-10-17 ENCOUNTER — Ambulatory Visit: Payer: Medicare HMO

## 2020-10-19 ENCOUNTER — Ambulatory Visit: Payer: Medicare HMO | Attending: Internal Medicine

## 2020-10-19 ENCOUNTER — Other Ambulatory Visit: Payer: Self-pay

## 2020-10-19 DIAGNOSIS — M6281 Muscle weakness (generalized): Secondary | ICD-10-CM | POA: Diagnosis not present

## 2020-10-19 DIAGNOSIS — G8929 Other chronic pain: Secondary | ICD-10-CM | POA: Insufficient documentation

## 2020-10-19 DIAGNOSIS — M545 Low back pain, unspecified: Secondary | ICD-10-CM | POA: Insufficient documentation

## 2020-10-19 DIAGNOSIS — R293 Abnormal posture: Secondary | ICD-10-CM

## 2020-10-19 NOTE — Patient Instructions (Signed)
Supine Posterior Pelvic Tilt REPS: 10 SETS: 1 HOLD: 3 DAILY: 2 WEEKLY: 7 Supine Piriformis Stretch with Foot on Ground REPS: 3 SETS: 1 HOLD: 20 DAILY: 2 WEEKLY: 7 Seated Flexion Stretch REPS: 3 SETS: 2 HOLD: 20 DAILY: 2 WEEKLY: 7  Access Code: [email or web address]

## 2020-10-19 NOTE — Therapy (Addendum)
[facility] [place], [place], [address] Phone: [phone]   Fax:  [phone]  Physical Therapy Evaluation / Discharge  Patient Details  Name: Candice Watkins MRN: [id number] Date of Birth: [birth year] Referring Provider (PT): Edmonia Lynch, MD  PHYSICAL THERAPY DISCHARGE SUMMARY  Visits from Start of Care: 1  Current functional level related to goals / functional outcomes: Unable to assess due to non return   Remaining deficits: Unable to assess due to non return   Education / Equipment: Initial HEP   Patient agrees to discharge. Patient goals were  unable to assess due to non-return . Patient is being discharged due to  non return.   Encounter Date: 10/19/2020   PT End of Session - 10/19/20 1622     Number of Visits 9    Authorization Type [employer]    Authorization Time Period 6th visit FOTO , 10th Visit FOTO/Re-eval    Progress Note Due on Visit 10    PT Start Time 1540    PT Stop Time 1621    PT Time Calculation (min) 41 min    Activity Tolerance Patient tolerated treatment well;No increased pain    Behavior During Therapy WFL for tasks assessed/performed             Past Medical History:  Diagnosis Date   Anxiety    Arthritis    "knees, ankles" (10/11/2015)   Asthma    Depression    Heartburn    HLD (hyperlipidemia)    Hypertension    Hypothyroidism    Kidney stones    Migraine    "monthly" (10/11/2015)   PONV (postoperative nausea and vomiting) X 1   Stomach ulcer     Past Surgical History:  Procedure Laterality Date   BREAST BIOPSY Bilateral    neg- core   CESAREAN SECTION  2000   JOINT REPLACEMENT     KNEE ARTHROSCOPY Right    THYROID SURGERY     "removed tumor"   TOTAL KNEE ARTHROPLASTY Right 10/11/2015   TOTAL KNEE ARTHROPLASTY Right 10/11/2015   Procedure: TOTAL KNEE ARTHROPLASTY;  Surgeon: Ninetta Lights, MD;  Location: [facility];  Service: Orthopedics;  Laterality: Right;    TUBAL LIGATION     VAGINAL HYSTERECTOMY      There were no vitals filed for this visit.    Subjective Assessment - 10/19/20 1539     Subjective Candice Watkins reports that her low back is progressively getting worse.  She has had it for several years and it has been progressively getting worse over the last three years.  She has a right knee replacement in 2017.  The patient states her pain is across the low back.  It is pretty much constant.  Her pain is aggravated with prolong standing, lifting,  and prolong walking over 30 minutes.  She denies numbness, tingling, and radicular pain.    Limitations Sitting;Lifting;Standing;Walking    How long can you sit comfortably? 30 minutes    How long can you stand comfortably? 15-20 minutes    How long can you walk comfortably? 30 minutes    Diagnostic tests X-rays (outside referral)    Patient Stated Goals To lessen the pain and learn how to make her back stronger.    Currently in Pain? Yes    Pain Score 3    worse in the last week 6/10   Pain Location Back    Pain Orientation Right;Left;Lower    Pain Descriptors /  Indicators Nagging    Pain Onset More than a month ago    Pain Frequency Constant    Aggravating Factors  Prolong standing, walking, sitting, and lifting.    Pain Relieving Factors Changes positions, stretches,    Effect of Pain on Daily Activities Avoids lifting, difficulty sleeping, limited with cooking/cleaning (gets assistance from her daughter), Limited standing/walking.    Multiple Pain Sites No                [facility] PT Assessment - 10/19/20 0001       Assessment   Medical Diagnosis Low Back Pain    Referring Provider (PT) Edmonia Lynch, MD    Onset Date/Surgical Date --   over last 3 years   Hand Dominance Right    Next MD Visit none    Prior Therapy Yes - last year      Precautions   Precautions None      Restrictions   Weight Bearing Restrictions No      Balance Screen   Has the patient fallen in the  past 6 months No      [place] residence    Living Arrangements Alone    Type of [place] One level      Prior Function   Level of Independence Independent with basic ADLs    Vocation On disability    Leisure walking      Cognition   Overall Cognitive Status Within Functional Limits for tasks assessed      Observation/Other Assessments   Focus on Therapeutic Outcomes (FOTO)  43% Back      Posture/Postural Control   Posture/Postural Control Postural limitations    Postural Limitations Increased lumbar lordosis;Rounded Shoulders      ROM / Strength   AROM / PROM / Strength AROM;Strength      AROM   Lumbar Flexion 65 deg    Lumbar Extension 20 deg    Lumbar - Right Side Bend 15 deg    Lumbar - Left Side Bend 20 deg    Lumbar - Right Rotation 25%    Lumbar - Left Rotation 25%      Strength   Overall Strength Comments LE strengthe WFL except for hip abduction 4/5 and extension 4+/5    Lumbar Flexion 3/5    Lumbar Extension 3/5      Flexibility   ITB mod restrictions B    Piriformis Mild restrictionn B    Quadratus Lumborum mod restriction B      Palpation   Palpation comment Tenderness B QL, TFL, piriformis, and lumbar paraspinalis                        Objective measurements completed on examination: See above findings.       [facility] Adult PT Treatment/Exercise - 10/19/20 0001       Exercises   Exercises Lumbar;Knee/Hip      Lumbar Exercises: Stretches   Piriformis Stretch 3 reps;20 seconds;Right;Left    Other Lumbar Stretch Exercise Sitting lumbar flexion 20" x 3      Lumbar Exercises: Supine   Pelvic Tilt 10 reps;5 seconds                    PT Education - 10/19/20 1618     Education Details diagnosis/POC, HEP, FOTO    Person(s) Educated Patient    Methods Explanation  Comprehension Verbalized understanding              PT Short Term Goals - 10/19/20 1553        PT SHORT TERM GOAL #1   Title The patient will be independent in a basic HEP.    Baseline no HEP currently    Time 2    Period Weeks    Status New    Target Date 11/02/20               PT Long Term Goals - 10/19/20 1553       PT LONG TERM GOAL #1   Title The patient will score a 60% on FOTO or better.    Baseline 43%    Time 8    Period Weeks    Status New    Target Date 12/14/20      PT LONG TERM GOAL #2   Title The patient will present with improvement of core stabilization (lumbar flexion) to 4-/5.    Baseline 3/5    Time 8    Period Weeks    Status New    Target Date 12/14/20      PT LONG TERM GOAL #3   Title The pt will be able to walk for 45 minutes with pain <3/10.    Baseline 30 minutes    Time 8    Period Weeks    Status New    Target Date 12/14/20                    Plan - 10/19/20 1625     Clinical Impression Statement Candice Watkins is a 50 y/o female who reports a gradual onset of low back pain over the last 3 years.  She presents with an increase of lumbar lordosis in standing and a reduction of core stabilization strength.  The patient has muscular tenderness around the lumbar spine and bilateral hips.  She has a past medical history of a right TKA in 2017 and she reports needing her left knee replaced as well.  The patient is currently impaired with lifting, prolong walking, prolong standing, and prolong sitting.  She denies radicular symptoms into the lower extremity.  Recommend physical therapy for core stabilization, flexibility, and patient education to improve functional ability.    Examination-Activity Limitations Bathing;Locomotion Level;Bed Mobility;Carry;Sleep;Sit;Squat;Stand;Lift;Other   standing   Examination-Participation Restrictions Cleaning;Community Activity;Laundry;Yard Work;Shop    Stability/Clinical Decision Making Stable/Uncomplicated    Clinical Decision Making Low    Rehab Potential Good    PT Frequency 1x / week     PT Duration 8 weeks    PT Treatment/Interventions ADLs/Self Care Home Management;Cryotherapy;Electrical Stimulation;Aquatic Therapy;Moist Heat;Ultrasound;Traction;DME Instruction;Gait training;Functional mobility training;Stair training;Therapeutic activities;Neuromuscular re-education;Balance training;Therapeutic exercise;Patient/family education;Manual techniques;Taping;Dry needling;Passive range of motion;Spinal Manipulations;Joint Manipulations    PT Next Visit Plan Review HEP, progress stretches and core stabilization.    PT Home Exercise Plan Access Code: [email or web address]    Consulted and Agree with Plan of Care Patient             Patient will benefit from skilled therapeutic intervention in order to improve the following deficits and impairments:  Difficulty walking,Decreased range of motion,Decreased endurance,Increased muscle spasms,Decreased activity tolerance,Pain,Impaired flexibility,Improper body mechanics,Decreased mobility,Decreased strength,Postural dysfunction  Visit Diagnosis: Chronic bilateral low back pain without sciatica  Muscle weakness (generalized)  Abnormal posture  12/05/20: Discharge due to non return Rich Number, PT, DPT, OCS, Crt. DN 12/05/20   Problem List Patient Active Problem List   Diagnosis  Date Noted   MDD (major depressive disorder), recurrent severe, without psychosis ([place]) 01/28/2017   S/P total knee replacement 10/11/2015   Osteoarthritis of knee 09/27/2013   Patellofemoral dysfunction 09/27/2013   Hypothyroidism 09/14/2007   Allergic rhinitis 03/04/2006   Rich Number, PT, DPT, OCS, Crt. DN Bethena Midget 10/19/2020, 5:00 PM  Referring diagnosis? Low Back Pain Treatment diagnosis? (if different than referring diagnosis) Abnormal posture, muscle weakness What was this (referring dx) caused by? []  Surgery []  Fall [x]  Ongoing issue []  Arthritis []  Other: ____________  Laterality: []  Rt []  Lt [x]  Both  Check all possible CPT  codes:      [x]  38101 (Therapeutic Exercise)  []  92507 (SLP Treatment)  [x]  97112 (Neuro Re-ed)   []  92526 (Swallowing Treatment)   [x]  97116 (Gait Training)   []  [id number] (Cognitive Training, 1st 15 minutes) [x]  97140 (Manual Therapy)   []  97130 (Cognitive Training, each add'l 15 minutes)  [x]  97530 (Therapeutic Activities)  []  Other, List CPT Code ____________    [x]  75102 (Self Care)       []  All codes above (97110 - 97535)  [x]  97012 (Mechanical Traction)  [x]  97014 (E-stim Unattended)  []  97032 (E-stim manual)  []  97033 (Ionto)  []  97035 (Ultrasound)  []  97760 (Orthotic Fit) []  [id number] (Physical Performance Training) [x]  [id number] (Aquatic Therapy) []  97034 (Contrast Bath) []  [id number] (Paraffin) []  97597 (Wound Care 1st 20 sq cm) []  97598 (Wound Care each add'l 20 sq cm) []  97016 (Vasopneumatic Device) []  [id number] Comptroller) []  [id number] (Prosthetic Training)  [place], [place], [address] Phone: [phone]   Fax:  [phone]  Name: Candice Watkins MRN: [id number] Date of Birth: [birth year]

## 2020-10-20 DIAGNOSIS — K5909 Other constipation: Secondary | ICD-10-CM | POA: Diagnosis not present

## 2020-10-20 DIAGNOSIS — Z6841 Body Mass Index (BMI) 40.0 and over, adult: Secondary | ICD-10-CM | POA: Diagnosis not present

## 2020-10-20 DIAGNOSIS — Z8371 Family history of colonic polyps: Secondary | ICD-10-CM | POA: Diagnosis not present

## 2020-10-24 ENCOUNTER — Ambulatory Visit: Payer: Medicare HMO

## 2020-10-31 ENCOUNTER — Ambulatory Visit: Payer: Medicare HMO

## 2020-11-08 DIAGNOSIS — F332 Major depressive disorder, recurrent severe without psychotic features: Secondary | ICD-10-CM | POA: Diagnosis not present

## 2020-11-08 DIAGNOSIS — F438 Other reactions to severe stress: Secondary | ICD-10-CM | POA: Diagnosis not present

## 2020-11-08 DIAGNOSIS — F411 Generalized anxiety disorder: Secondary | ICD-10-CM | POA: Diagnosis not present

## 2020-11-09 DIAGNOSIS — E039 Hypothyroidism, unspecified: Secondary | ICD-10-CM | POA: Diagnosis not present

## 2020-11-09 DIAGNOSIS — G4733 Obstructive sleep apnea (adult) (pediatric): Secondary | ICD-10-CM | POA: Diagnosis not present

## 2020-11-09 DIAGNOSIS — K219 Gastro-esophageal reflux disease without esophagitis: Secondary | ICD-10-CM | POA: Diagnosis not present

## 2020-11-09 DIAGNOSIS — Z7689 Persons encountering health services in other specified circumstances: Secondary | ICD-10-CM | POA: Diagnosis not present

## 2020-11-09 DIAGNOSIS — E782 Mixed hyperlipidemia: Secondary | ICD-10-CM | POA: Diagnosis not present

## 2020-11-09 DIAGNOSIS — Z8711 Personal history of peptic ulcer disease: Secondary | ICD-10-CM | POA: Diagnosis not present

## 2020-11-09 DIAGNOSIS — J45909 Unspecified asthma, uncomplicated: Secondary | ICD-10-CM | POA: Diagnosis not present

## 2020-11-09 DIAGNOSIS — F411 Generalized anxiety disorder: Secondary | ICD-10-CM | POA: Diagnosis not present

## 2020-11-14 ENCOUNTER — Other Ambulatory Visit: Payer: Self-pay | Admitting: Internal Medicine

## 2020-11-14 DIAGNOSIS — Z1231 Encounter for screening mammogram for malignant neoplasm of breast: Secondary | ICD-10-CM

## 2020-12-13 DIAGNOSIS — F438 Other reactions to severe stress: Secondary | ICD-10-CM | POA: Diagnosis not present

## 2020-12-13 DIAGNOSIS — F332 Major depressive disorder, recurrent severe without psychotic features: Secondary | ICD-10-CM | POA: Diagnosis not present

## 2020-12-13 DIAGNOSIS — F411 Generalized anxiety disorder: Secondary | ICD-10-CM | POA: Diagnosis not present

## 2020-12-14 DIAGNOSIS — G4733 Obstructive sleep apnea (adult) (pediatric): Secondary | ICD-10-CM | POA: Diagnosis not present

## 2020-12-14 DIAGNOSIS — E039 Hypothyroidism, unspecified: Secondary | ICD-10-CM | POA: Diagnosis not present

## 2020-12-14 DIAGNOSIS — K219 Gastro-esophageal reflux disease without esophagitis: Secondary | ICD-10-CM | POA: Diagnosis not present

## 2020-12-21 ENCOUNTER — Other Ambulatory Visit: Payer: Self-pay

## 2020-12-21 ENCOUNTER — Ambulatory Visit
Admission: RE | Admit: 2020-12-21 | Discharge: 2020-12-21 | Disposition: A | Discharge status: Home / Self Care | Payer: Medicare HMO | Source: Ambulatory Visit | Attending: Internal Medicine | Admitting: Internal Medicine

## 2020-12-21 DIAGNOSIS — Z1231 Encounter for screening mammogram for malignant neoplasm of breast: Secondary | ICD-10-CM | POA: Insufficient documentation

## 2020-12-26 DIAGNOSIS — K219 Gastro-esophageal reflux disease without esophagitis: Secondary | ICD-10-CM | POA: Diagnosis not present

## 2020-12-26 DIAGNOSIS — E782 Mixed hyperlipidemia: Secondary | ICD-10-CM | POA: Diagnosis not present

## 2020-12-26 DIAGNOSIS — G4700 Insomnia, unspecified: Secondary | ICD-10-CM | POA: Diagnosis not present

## 2020-12-26 DIAGNOSIS — E039 Hypothyroidism, unspecified: Secondary | ICD-10-CM | POA: Diagnosis not present

## 2020-12-26 DIAGNOSIS — I10 Essential (primary) hypertension: Secondary | ICD-10-CM | POA: Diagnosis not present

## 2020-12-29 DIAGNOSIS — E039 Hypothyroidism, unspecified: Secondary | ICD-10-CM | POA: Diagnosis not present

## 2020-12-29 DIAGNOSIS — K219 Gastro-esophageal reflux disease without esophagitis: Secondary | ICD-10-CM | POA: Diagnosis not present

## 2020-12-29 DIAGNOSIS — E782 Mixed hyperlipidemia: Secondary | ICD-10-CM | POA: Diagnosis not present

## 2020-12-29 DIAGNOSIS — I10 Essential (primary) hypertension: Secondary | ICD-10-CM | POA: Diagnosis not present

## 2021-01-03 DIAGNOSIS — F411 Generalized anxiety disorder: Secondary | ICD-10-CM | POA: Diagnosis not present

## 2021-01-03 DIAGNOSIS — F438 Other reactions to severe stress: Secondary | ICD-10-CM | POA: Diagnosis not present

## 2021-01-03 DIAGNOSIS — F332 Major depressive disorder, recurrent severe without psychotic features: Secondary | ICD-10-CM | POA: Diagnosis not present

## 2021-01-17 DIAGNOSIS — J45909 Unspecified asthma, uncomplicated: Secondary | ICD-10-CM | POA: Diagnosis not present

## 2021-01-17 DIAGNOSIS — I10 Essential (primary) hypertension: Secondary | ICD-10-CM | POA: Diagnosis not present

## 2021-01-17 DIAGNOSIS — Z8249 Family history of ischemic heart disease and other diseases of the circulatory system: Secondary | ICD-10-CM | POA: Diagnosis not present

## 2021-01-17 DIAGNOSIS — Z833 Family history of diabetes mellitus: Secondary | ICD-10-CM | POA: Diagnosis not present

## 2021-01-17 DIAGNOSIS — Z825 Family history of asthma and other chronic lower respiratory diseases: Secondary | ICD-10-CM | POA: Diagnosis not present

## 2021-01-17 DIAGNOSIS — E039 Hypothyroidism, unspecified: Secondary | ICD-10-CM | POA: Diagnosis not present

## 2021-01-17 DIAGNOSIS — F331 Major depressive disorder, recurrent, moderate: Secondary | ICD-10-CM | POA: Diagnosis not present

## 2021-01-17 DIAGNOSIS — I471 Supraventricular tachycardia: Secondary | ICD-10-CM | POA: Diagnosis not present

## 2021-01-17 DIAGNOSIS — F411 Generalized anxiety disorder: Secondary | ICD-10-CM | POA: Diagnosis not present

## 2021-01-17 DIAGNOSIS — Z818 Family history of other mental and behavioral disorders: Secondary | ICD-10-CM | POA: Diagnosis not present

## 2021-01-17 DIAGNOSIS — E785 Hyperlipidemia, unspecified: Secondary | ICD-10-CM | POA: Diagnosis not present

## 2021-01-17 DIAGNOSIS — G8929 Other chronic pain: Secondary | ICD-10-CM | POA: Diagnosis not present

## 2021-01-23 DIAGNOSIS — G4733 Obstructive sleep apnea (adult) (pediatric): Secondary | ICD-10-CM | POA: Diagnosis not present

## 2021-01-25 ENCOUNTER — Encounter: Payer: Self-pay | Admitting: *Deleted

## 2021-01-26 ENCOUNTER — Encounter: Admission: RE | Payer: Self-pay | Source: Home / Self Care

## 2021-01-26 ENCOUNTER — Ambulatory Visit: Admission: RE | Admit: 2021-01-26 | Payer: Medicare HMO | Source: Home / Self Care

## 2021-01-26 HISTORY — DX: Primary hyperparathyroidism: E21.0

## 2021-01-26 HISTORY — DX: Other seasonal allergic rhinitis: J30.2

## 2021-01-26 HISTORY — DX: Sleep apnea, unspecified: G47.30

## 2021-01-26 SURGERY — COLONOSCOPY WITH PROPOFOL
Anesthesia: General

## 2021-01-31 DIAGNOSIS — H6121 Impacted cerumen, right ear: Secondary | ICD-10-CM | POA: Diagnosis not present

## 2021-01-31 DIAGNOSIS — Z23 Encounter for immunization: Secondary | ICD-10-CM | POA: Diagnosis not present

## 2021-01-31 DIAGNOSIS — H6123 Impacted cerumen, bilateral: Secondary | ICD-10-CM | POA: Diagnosis not present

## 2021-01-31 DIAGNOSIS — Z1211 Encounter for screening for malignant neoplasm of colon: Secondary | ICD-10-CM | POA: Diagnosis not present

## 2021-01-31 DIAGNOSIS — M545 Low back pain, unspecified: Secondary | ICD-10-CM | POA: Diagnosis not present

## 2021-01-31 DIAGNOSIS — Z0000 Encounter for general adult medical examination without abnormal findings: Secondary | ICD-10-CM | POA: Diagnosis not present

## 2021-01-31 DIAGNOSIS — H6122 Impacted cerumen, left ear: Secondary | ICD-10-CM | POA: Diagnosis not present

## 2021-02-02 DIAGNOSIS — F411 Generalized anxiety disorder: Secondary | ICD-10-CM | POA: Diagnosis not present

## 2021-02-02 DIAGNOSIS — F332 Major depressive disorder, recurrent severe without psychotic features: Secondary | ICD-10-CM | POA: Diagnosis not present

## 2021-02-02 DIAGNOSIS — F438 Other reactions to severe stress: Secondary | ICD-10-CM | POA: Diagnosis not present

## 2021-02-06 DIAGNOSIS — I10 Essential (primary) hypertension: Secondary | ICD-10-CM | POA: Diagnosis not present

## 2021-02-06 DIAGNOSIS — G4700 Insomnia, unspecified: Secondary | ICD-10-CM | POA: Diagnosis not present

## 2021-02-06 DIAGNOSIS — F411 Generalized anxiety disorder: Secondary | ICD-10-CM | POA: Diagnosis not present

## 2021-02-06 DIAGNOSIS — F331 Major depressive disorder, recurrent, moderate: Secondary | ICD-10-CM | POA: Diagnosis not present

## 2021-02-22 DIAGNOSIS — G4733 Obstructive sleep apnea (adult) (pediatric): Secondary | ICD-10-CM | POA: Diagnosis not present

## 2021-03-06 DIAGNOSIS — Z23 Encounter for immunization: Secondary | ICD-10-CM | POA: Diagnosis not present

## 2021-03-07 DIAGNOSIS — F332 Major depressive disorder, recurrent severe without psychotic features: Secondary | ICD-10-CM | POA: Diagnosis not present

## 2021-03-07 DIAGNOSIS — F411 Generalized anxiety disorder: Secondary | ICD-10-CM | POA: Diagnosis not present

## 2021-03-08 DIAGNOSIS — I10 Essential (primary) hypertension: Secondary | ICD-10-CM | POA: Diagnosis not present

## 2021-03-12 ENCOUNTER — Ambulatory Visit (HOSPITAL_COMMUNITY): Payer: Medicaid Other | Admitting: Clinical

## 2021-03-19 ENCOUNTER — Ambulatory Visit (HOSPITAL_COMMUNITY): Payer: Medicaid Other

## 2021-03-19 DIAGNOSIS — F411 Generalized anxiety disorder: Secondary | ICD-10-CM | POA: Diagnosis not present

## 2021-03-19 DIAGNOSIS — F332 Major depressive disorder, recurrent severe without psychotic features: Secondary | ICD-10-CM | POA: Diagnosis not present

## 2021-03-25 DIAGNOSIS — G4733 Obstructive sleep apnea (adult) (pediatric): Secondary | ICD-10-CM | POA: Diagnosis not present

## 2021-04-04 DIAGNOSIS — R7303 Prediabetes: Secondary | ICD-10-CM | POA: Diagnosis not present

## 2021-04-04 DIAGNOSIS — I10 Essential (primary) hypertension: Secondary | ICD-10-CM | POA: Diagnosis not present

## 2021-04-04 DIAGNOSIS — E559 Vitamin D deficiency, unspecified: Secondary | ICD-10-CM | POA: Diagnosis not present

## 2021-04-06 DIAGNOSIS — F332 Major depressive disorder, recurrent severe without psychotic features: Secondary | ICD-10-CM | POA: Diagnosis not present

## 2021-04-06 DIAGNOSIS — F411 Generalized anxiety disorder: Secondary | ICD-10-CM | POA: Diagnosis not present

## 2021-04-13 DIAGNOSIS — E559 Vitamin D deficiency, unspecified: Secondary | ICD-10-CM | POA: Diagnosis not present

## 2021-04-13 DIAGNOSIS — E876 Hypokalemia: Secondary | ICD-10-CM | POA: Diagnosis not present

## 2021-04-13 DIAGNOSIS — I10 Essential (primary) hypertension: Secondary | ICD-10-CM | POA: Diagnosis not present

## 2021-04-13 DIAGNOSIS — F411 Generalized anxiety disorder: Secondary | ICD-10-CM | POA: Diagnosis not present

## 2021-04-13 DIAGNOSIS — R7303 Prediabetes: Secondary | ICD-10-CM | POA: Diagnosis not present

## 2021-04-20 DIAGNOSIS — R112 Nausea with vomiting, unspecified: Secondary | ICD-10-CM | POA: Diagnosis not present

## 2021-04-20 DIAGNOSIS — R1013 Epigastric pain: Secondary | ICD-10-CM | POA: Diagnosis not present

## 2021-05-07 DIAGNOSIS — F411 Generalized anxiety disorder: Secondary | ICD-10-CM | POA: Diagnosis not present

## 2021-05-07 DIAGNOSIS — F332 Major depressive disorder, recurrent severe without psychotic features: Secondary | ICD-10-CM | POA: Diagnosis not present

## 2021-05-23 DIAGNOSIS — J40 Bronchitis, not specified as acute or chronic: Secondary | ICD-10-CM | POA: Diagnosis not present

## 2021-05-23 DIAGNOSIS — B9689 Other specified bacterial agents as the cause of diseases classified elsewhere: Secondary | ICD-10-CM | POA: Diagnosis not present

## 2021-05-23 DIAGNOSIS — J069 Acute upper respiratory infection, unspecified: Secondary | ICD-10-CM | POA: Diagnosis not present

## 2021-05-23 DIAGNOSIS — J329 Chronic sinusitis, unspecified: Secondary | ICD-10-CM | POA: Diagnosis not present

## 2021-05-25 DIAGNOSIS — I10 Essential (primary) hypertension: Secondary | ICD-10-CM | POA: Diagnosis not present

## 2021-05-28 DIAGNOSIS — I10 Essential (primary) hypertension: Secondary | ICD-10-CM | POA: Diagnosis not present

## 2021-05-28 DIAGNOSIS — E669 Obesity, unspecified: Secondary | ICD-10-CM | POA: Diagnosis not present

## 2021-05-28 DIAGNOSIS — E876 Hypokalemia: Secondary | ICD-10-CM | POA: Diagnosis not present

## 2021-07-11 DIAGNOSIS — M25562 Pain in left knee: Secondary | ICD-10-CM | POA: Diagnosis not present

## 2021-07-17 DIAGNOSIS — R7303 Prediabetes: Secondary | ICD-10-CM | POA: Diagnosis not present

## 2021-07-17 DIAGNOSIS — E039 Hypothyroidism, unspecified: Secondary | ICD-10-CM | POA: Diagnosis not present

## 2021-07-17 DIAGNOSIS — I10 Essential (primary) hypertension: Secondary | ICD-10-CM | POA: Diagnosis not present

## 2021-07-18 DIAGNOSIS — F411 Generalized anxiety disorder: Secondary | ICD-10-CM | POA: Diagnosis not present

## 2021-07-18 DIAGNOSIS — F332 Major depressive disorder, recurrent severe without psychotic features: Secondary | ICD-10-CM | POA: Diagnosis not present

## 2021-07-18 DIAGNOSIS — R69 Illness, unspecified: Secondary | ICD-10-CM | POA: Diagnosis not present

## 2021-07-19 DIAGNOSIS — R7303 Prediabetes: Secondary | ICD-10-CM | POA: Diagnosis not present

## 2021-07-19 DIAGNOSIS — E039 Hypothyroidism, unspecified: Secondary | ICD-10-CM | POA: Diagnosis not present

## 2021-07-19 DIAGNOSIS — I10 Essential (primary) hypertension: Secondary | ICD-10-CM | POA: Diagnosis not present

## 2021-08-01 DIAGNOSIS — Z23 Encounter for immunization: Secondary | ICD-10-CM | POA: Diagnosis not present

## 2021-08-01 DIAGNOSIS — M1712 Unilateral primary osteoarthritis, left knee: Secondary | ICD-10-CM | POA: Diagnosis not present

## 2021-08-08 DIAGNOSIS — M1712 Unilateral primary osteoarthritis, left knee: Secondary | ICD-10-CM | POA: Diagnosis not present

## 2021-08-15 DIAGNOSIS — F332 Major depressive disorder, recurrent severe without psychotic features: Secondary | ICD-10-CM | POA: Diagnosis not present

## 2021-08-15 DIAGNOSIS — F411 Generalized anxiety disorder: Secondary | ICD-10-CM | POA: Diagnosis not present

## 2021-08-15 DIAGNOSIS — R69 Illness, unspecified: Secondary | ICD-10-CM | POA: Diagnosis not present

## 2021-08-15 DIAGNOSIS — M1712 Unilateral primary osteoarthritis, left knee: Secondary | ICD-10-CM | POA: Diagnosis not present

## 2021-09-19 DIAGNOSIS — F332 Major depressive disorder, recurrent severe without psychotic features: Secondary | ICD-10-CM | POA: Diagnosis not present

## 2021-09-19 DIAGNOSIS — R69 Illness, unspecified: Secondary | ICD-10-CM | POA: Diagnosis not present

## 2021-10-04 DIAGNOSIS — R69 Illness, unspecified: Secondary | ICD-10-CM | POA: Diagnosis not present

## 2021-10-05 DIAGNOSIS — R69 Illness, unspecified: Secondary | ICD-10-CM | POA: Diagnosis not present

## 2021-10-17 DIAGNOSIS — I10 Essential (primary) hypertension: Secondary | ICD-10-CM | POA: Diagnosis not present

## 2021-10-17 DIAGNOSIS — E559 Vitamin D deficiency, unspecified: Secondary | ICD-10-CM | POA: Diagnosis not present

## 2021-10-17 DIAGNOSIS — E039 Hypothyroidism, unspecified: Secondary | ICD-10-CM | POA: Diagnosis not present

## 2021-10-17 DIAGNOSIS — E876 Hypokalemia: Secondary | ICD-10-CM | POA: Diagnosis not present

## 2021-10-23 DIAGNOSIS — E559 Vitamin D deficiency, unspecified: Secondary | ICD-10-CM | POA: Diagnosis not present

## 2021-10-23 DIAGNOSIS — R7303 Prediabetes: Secondary | ICD-10-CM | POA: Diagnosis not present

## 2021-10-23 DIAGNOSIS — Z1211 Encounter for screening for malignant neoplasm of colon: Secondary | ICD-10-CM | POA: Diagnosis not present

## 2021-10-23 DIAGNOSIS — K219 Gastro-esophageal reflux disease without esophagitis: Secondary | ICD-10-CM | POA: Diagnosis not present

## 2021-10-23 DIAGNOSIS — R112 Nausea with vomiting, unspecified: Secondary | ICD-10-CM | POA: Diagnosis not present

## 2021-10-23 DIAGNOSIS — K5900 Constipation, unspecified: Secondary | ICD-10-CM | POA: Diagnosis not present

## 2021-10-23 DIAGNOSIS — E782 Mixed hyperlipidemia: Secondary | ICD-10-CM | POA: Diagnosis not present

## 2021-10-23 DIAGNOSIS — I10 Essential (primary) hypertension: Secondary | ICD-10-CM | POA: Diagnosis not present

## 2021-10-23 DIAGNOSIS — R7989 Other specified abnormal findings of blood chemistry: Secondary | ICD-10-CM | POA: Diagnosis not present

## 2021-10-23 DIAGNOSIS — E039 Hypothyroidism, unspecified: Secondary | ICD-10-CM | POA: Diagnosis not present

## 2021-11-01 DIAGNOSIS — F411 Generalized anxiety disorder: Secondary | ICD-10-CM | POA: Diagnosis not present

## 2021-11-01 DIAGNOSIS — R69 Illness, unspecified: Secondary | ICD-10-CM | POA: Diagnosis not present

## 2021-11-14 ENCOUNTER — Other Ambulatory Visit: Payer: Self-pay | Admitting: Family Medicine

## 2021-11-14 DIAGNOSIS — Z1231 Encounter for screening mammogram for malignant neoplasm of breast: Secondary | ICD-10-CM

## 2021-11-30 DIAGNOSIS — R69 Illness, unspecified: Secondary | ICD-10-CM | POA: Diagnosis not present

## 2021-11-30 DIAGNOSIS — F332 Major depressive disorder, recurrent severe without psychotic features: Secondary | ICD-10-CM | POA: Diagnosis not present

## 2021-12-07 DIAGNOSIS — E119 Type 2 diabetes mellitus without complications: Secondary | ICD-10-CM | POA: Diagnosis not present

## 2021-12-07 DIAGNOSIS — E782 Mixed hyperlipidemia: Secondary | ICD-10-CM | POA: Diagnosis not present

## 2021-12-07 DIAGNOSIS — R609 Edema, unspecified: Secondary | ICD-10-CM | POA: Diagnosis not present

## 2021-12-07 DIAGNOSIS — D519 Vitamin B12 deficiency anemia, unspecified: Secondary | ICD-10-CM | POA: Diagnosis not present

## 2021-12-07 DIAGNOSIS — R1112 Projectile vomiting: Secondary | ICD-10-CM | POA: Diagnosis not present

## 2021-12-07 DIAGNOSIS — R002 Palpitations: Secondary | ICD-10-CM | POA: Diagnosis not present

## 2021-12-07 DIAGNOSIS — E039 Hypothyroidism, unspecified: Secondary | ICD-10-CM | POA: Diagnosis not present

## 2021-12-07 DIAGNOSIS — G4733 Obstructive sleep apnea (adult) (pediatric): Secondary | ICD-10-CM | POA: Diagnosis not present

## 2021-12-07 DIAGNOSIS — R0600 Dyspnea, unspecified: Secondary | ICD-10-CM | POA: Diagnosis not present

## 2021-12-07 DIAGNOSIS — I10 Essential (primary) hypertension: Secondary | ICD-10-CM | POA: Diagnosis not present

## 2021-12-11 DIAGNOSIS — R079 Chest pain, unspecified: Secondary | ICD-10-CM | POA: Diagnosis not present

## 2021-12-11 DIAGNOSIS — G4733 Obstructive sleep apnea (adult) (pediatric): Secondary | ICD-10-CM | POA: Diagnosis not present

## 2021-12-11 DIAGNOSIS — R002 Palpitations: Secondary | ICD-10-CM | POA: Diagnosis not present

## 2021-12-11 DIAGNOSIS — K219 Gastro-esophageal reflux disease without esophagitis: Secondary | ICD-10-CM | POA: Diagnosis not present

## 2021-12-11 DIAGNOSIS — R0602 Shortness of breath: Secondary | ICD-10-CM | POA: Diagnosis not present

## 2021-12-11 DIAGNOSIS — E782 Mixed hyperlipidemia: Secondary | ICD-10-CM | POA: Diagnosis not present

## 2021-12-11 DIAGNOSIS — I10 Essential (primary) hypertension: Secondary | ICD-10-CM | POA: Diagnosis not present

## 2021-12-18 DIAGNOSIS — F411 Generalized anxiety disorder: Secondary | ICD-10-CM | POA: Diagnosis not present

## 2021-12-18 DIAGNOSIS — R69 Illness, unspecified: Secondary | ICD-10-CM | POA: Diagnosis not present

## 2021-12-19 DIAGNOSIS — R079 Chest pain, unspecified: Secondary | ICD-10-CM | POA: Diagnosis not present

## 2021-12-21 DIAGNOSIS — R946 Abnormal results of thyroid function studies: Secondary | ICD-10-CM | POA: Diagnosis not present

## 2021-12-21 DIAGNOSIS — G4733 Obstructive sleep apnea (adult) (pediatric): Secondary | ICD-10-CM | POA: Diagnosis not present

## 2021-12-21 DIAGNOSIS — K219 Gastro-esophageal reflux disease without esophagitis: Secondary | ICD-10-CM | POA: Diagnosis not present

## 2021-12-21 DIAGNOSIS — E782 Mixed hyperlipidemia: Secondary | ICD-10-CM | POA: Diagnosis not present

## 2021-12-21 DIAGNOSIS — I10 Essential (primary) hypertension: Secondary | ICD-10-CM | POA: Diagnosis not present

## 2021-12-21 DIAGNOSIS — R079 Chest pain, unspecified: Secondary | ICD-10-CM | POA: Diagnosis not present

## 2021-12-21 DIAGNOSIS — E039 Hypothyroidism, unspecified: Secondary | ICD-10-CM | POA: Diagnosis not present

## 2021-12-24 ENCOUNTER — Ambulatory Visit
Admission: RE | Admit: 2021-12-24 | Discharge: 2021-12-24 | Disposition: A | Discharge status: Home / Self Care | Payer: Medicare HMO | Source: Ambulatory Visit | Attending: Family Medicine | Admitting: Family Medicine

## 2021-12-24 DIAGNOSIS — I10 Essential (primary) hypertension: Secondary | ICD-10-CM | POA: Diagnosis not present

## 2021-12-24 DIAGNOSIS — G4733 Obstructive sleep apnea (adult) (pediatric): Secondary | ICD-10-CM | POA: Diagnosis not present

## 2021-12-24 DIAGNOSIS — Z1231 Encounter for screening mammogram for malignant neoplasm of breast: Secondary | ICD-10-CM | POA: Insufficient documentation

## 2021-12-24 DIAGNOSIS — K219 Gastro-esophageal reflux disease without esophagitis: Secondary | ICD-10-CM | POA: Diagnosis not present

## 2021-12-24 DIAGNOSIS — R079 Chest pain, unspecified: Secondary | ICD-10-CM | POA: Diagnosis not present

## 2021-12-24 DIAGNOSIS — E782 Mixed hyperlipidemia: Secondary | ICD-10-CM | POA: Diagnosis not present

## 2022-01-03 DIAGNOSIS — F411 Generalized anxiety disorder: Secondary | ICD-10-CM | POA: Diagnosis not present

## 2022-01-03 DIAGNOSIS — R69 Illness, unspecified: Secondary | ICD-10-CM | POA: Diagnosis not present

## 2022-01-14 DIAGNOSIS — M1712 Unilateral primary osteoarthritis, left knee: Secondary | ICD-10-CM | POA: Diagnosis not present

## 2022-02-01 DIAGNOSIS — F332 Major depressive disorder, recurrent severe without psychotic features: Secondary | ICD-10-CM | POA: Diagnosis not present

## 2022-02-01 DIAGNOSIS — R69 Illness, unspecified: Secondary | ICD-10-CM | POA: Diagnosis not present

## 2022-02-07 DIAGNOSIS — R69 Illness, unspecified: Secondary | ICD-10-CM | POA: Diagnosis not present

## 2022-02-07 DIAGNOSIS — F411 Generalized anxiety disorder: Secondary | ICD-10-CM | POA: Diagnosis not present

## 2022-02-19 DIAGNOSIS — R7303 Prediabetes: Secondary | ICD-10-CM | POA: Diagnosis not present

## 2022-02-19 DIAGNOSIS — I10 Essential (primary) hypertension: Secondary | ICD-10-CM | POA: Diagnosis not present

## 2022-02-19 DIAGNOSIS — G4733 Obstructive sleep apnea (adult) (pediatric): Secondary | ICD-10-CM | POA: Diagnosis not present

## 2022-02-19 DIAGNOSIS — Z1211 Encounter for screening for malignant neoplasm of colon: Secondary | ICD-10-CM | POA: Diagnosis not present

## 2022-02-19 DIAGNOSIS — E782 Mixed hyperlipidemia: Secondary | ICD-10-CM | POA: Diagnosis not present

## 2022-02-19 DIAGNOSIS — R112 Nausea with vomiting, unspecified: Secondary | ICD-10-CM | POA: Diagnosis not present

## 2022-02-19 DIAGNOSIS — Z23 Encounter for immunization: Secondary | ICD-10-CM | POA: Diagnosis not present

## 2022-02-19 DIAGNOSIS — D519 Vitamin B12 deficiency anemia, unspecified: Secondary | ICD-10-CM | POA: Diagnosis not present

## 2022-02-19 DIAGNOSIS — R109 Unspecified abdominal pain: Secondary | ICD-10-CM | POA: Diagnosis not present

## 2022-02-21 DIAGNOSIS — F411 Generalized anxiety disorder: Secondary | ICD-10-CM | POA: Diagnosis not present

## 2022-02-21 DIAGNOSIS — R69 Illness, unspecified: Secondary | ICD-10-CM | POA: Diagnosis not present

## 2022-03-08 DIAGNOSIS — R7303 Prediabetes: Secondary | ICD-10-CM | POA: Diagnosis not present

## 2022-03-08 DIAGNOSIS — I10 Essential (primary) hypertension: Secondary | ICD-10-CM | POA: Diagnosis not present

## 2022-03-08 DIAGNOSIS — E119 Type 2 diabetes mellitus without complications: Secondary | ICD-10-CM | POA: Diagnosis not present

## 2022-03-08 DIAGNOSIS — E039 Hypothyroidism, unspecified: Secondary | ICD-10-CM | POA: Diagnosis not present

## 2022-03-08 DIAGNOSIS — E782 Mixed hyperlipidemia: Secondary | ICD-10-CM | POA: Diagnosis not present

## 2022-03-08 DIAGNOSIS — K219 Gastro-esophageal reflux disease without esophagitis: Secondary | ICD-10-CM | POA: Diagnosis not present

## 2022-03-08 DIAGNOSIS — G4733 Obstructive sleep apnea (adult) (pediatric): Secondary | ICD-10-CM | POA: Diagnosis not present

## 2022-03-08 DIAGNOSIS — D519 Vitamin B12 deficiency anemia, unspecified: Secondary | ICD-10-CM | POA: Diagnosis not present

## 2022-03-08 DIAGNOSIS — E559 Vitamin D deficiency, unspecified: Secondary | ICD-10-CM | POA: Diagnosis not present

## 2022-03-14 DIAGNOSIS — F332 Major depressive disorder, recurrent severe without psychotic features: Secondary | ICD-10-CM | POA: Diagnosis not present

## 2022-03-14 DIAGNOSIS — R69 Illness, unspecified: Secondary | ICD-10-CM | POA: Diagnosis not present

## 2022-04-08 DIAGNOSIS — M1712 Unilateral primary osteoarthritis, left knee: Secondary | ICD-10-CM | POA: Diagnosis not present

## 2022-04-18 ENCOUNTER — Other Ambulatory Visit: Payer: Self-pay

## 2022-04-24 ENCOUNTER — Ambulatory Visit (INDEPENDENT_AMBULATORY_CARE_PROVIDER_SITE_OTHER): Payer: Medicare HMO | Admitting: Gastroenterology

## 2022-04-24 ENCOUNTER — Other Ambulatory Visit: Payer: Self-pay

## 2022-04-24 ENCOUNTER — Encounter: Payer: Self-pay | Admitting: Gastroenterology

## 2022-04-24 VITALS — BP 121/82 | HR 80 | Temp 97.5°F | Ht 63.0 in | Wt 266.2 lb

## 2022-04-24 DIAGNOSIS — R1013 Epigastric pain: Secondary | ICD-10-CM

## 2022-04-24 DIAGNOSIS — Z1211 Encounter for screening for malignant neoplasm of colon: Secondary | ICD-10-CM

## 2022-04-24 DIAGNOSIS — D649 Anemia, unspecified: Secondary | ICD-10-CM

## 2022-04-24 MED ORDER — NA SULFATE-K SULFATE-MG SULF 17.5-3.13-1.6 GM/177ML PO SOLN: 354.0000 mL | Freq: Once | ORAL | 0 refills | Status: AC

## 2022-04-24 NOTE — Progress Notes (Signed)
Cephas Darby, MD [address]  [place]  [place], [place] [address]  Main: [phone]  Fax: [phone]    Gastroenterology Consultation  Referring Provider:     Georgian Co, FNP Primary Care Physician:  Perrin Maltese, MD Primary Gastroenterologist:  Dr. Cephas Darby Reason for Consultation: Nausea and vomiting        HPI:   Candice Watkins is a 51 y.o. female referred by Dr. Perrin Maltese, MD  for consultation & management of nausea and vomiting.  Patient with history of metabolic syndrome, has been experiencing symptoms of nausea and vomiting of gastric contents for about 2 months.  These episodes are sporadic, not particularly after a meal.  Denies any nocturnal symptoms.  Denies any heartburn.  She is started on omeprazole 20 mg daily in the morning which has significantly helped with her symptoms.  She was on Ozempic for 1 year, switched to Trulicity because of unavailability.  She denies any other GI symptoms.  Review of labs revealed chronic microcytic anemia, most recent hemoglobin 11.3, MCV 79.5.  Unknown iron and B12 levels.  She denies any menorrhagia, rectal bleeding, hematuria  Patient does not smoke or drink alcohol  NSAIDs: None  Antiplts/Anticoagulants/Anti thrombotics: None  GI Procedures: None She denies any family history of GI malignancy  Past Medical History:  Diagnosis Date   Anxiety    Arthritis    "knees, ankles" (10/11/2015)   Asthma    Depression    Heartburn    HLD (hyperlipidemia)    Hypertension    Hypothyroidism    Kidney stones    Migraine    "monthly" (10/11/2015)   PONV (postoperative nausea and vomiting) X 1   Primary hyperparathyroidism ([place])    Seasonal allergies    Sleep apnea    Stomach ulcer     Past Surgical History:  Procedure Laterality Date   BREAST BIOPSY Bilateral    neg- core   CESAREAN SECTION  2000   JOINT REPLACEMENT     KNEE ARTHROSCOPY Right    THYROID SURGERY     "removed tumor"    TOTAL KNEE ARTHROPLASTY Right 10/11/2015   TOTAL KNEE ARTHROPLASTY Right 10/11/2015   Procedure: TOTAL KNEE ARTHROPLASTY;  Surgeon: Ninetta Lights, MD;  Location: [place];  Service: Orthopedics;  Laterality: Right;   TUBAL LIGATION     VAGINAL HYSTERECTOMY       Current Outpatient Medications:    amLODipine (NORVASC) 10 MG tablet, Take 1 tablet (10 mg total) by mouth daily. For high blood pressure, Disp: 30 tablet, Rfl: 0   Azelastine HCl 137 MCG/SPRAY SOLN, , Disp: , Rfl:    budesonide-formoterol (SYMBICORT) 80-4.5 MCG/ACT inhaler, Inhale 2 puffs into the lungs 2 (two) times daily., Disp: , Rfl:    cetirizine (ZYRTEC) 10 MG chewable tablet, Chew 10 mg by mouth at bedtime., Disp: , Rfl:    enalapril-hydrochlorothiazide (VASERETIC) 10-25 MG tablet, Take 0.5 tablets by mouth daily., Disp: , Rfl:    fexofenadine (ALLEGRA) 180 MG tablet, Take 180 mg by mouth daily., Disp: , Rfl:    hydrOXYzine (ATARAX/VISTARIL) 25 MG tablet, Take 1 tablet (25 mg total) by mouth 3 (three) times daily as needed for anxiety., Disp: 30 tablet, Rfl: 0   levothyroxine (SYNTHROID) 112 MCG tablet, Take 112 mcg by mouth every morning., Disp: , Rfl:    lisinopril-hydrochlorothiazide (ZESTORETIC) 10-12.5 MG tablet, Take 1 tablet by mouth daily., Disp: , Rfl:    methocarbamol (ROBAXIN)  500 MG tablet, Take 1 tablet (500 mg total) by mouth 2 (two) times daily., Disp: 20 tablet, Rfl: 0   metoprolol succinate (TOPROL-XL) 50 MG 24 hr tablet, Take 50 mg by mouth daily., Disp: , Rfl:    Na Sulfate-K Sulfate-Mg Sulf 17.5-3.13-1.6 GM/177ML SOLN, Take 354 mLs by mouth once for 1 dose., Disp: 354 mL, Rfl: 0   omeprazole (PRILOSEC) 20 MG capsule, , Disp: , Rfl:    rosuvastatin (CRESTOR) 10 MG tablet, Take 10 mg by mouth daily., Disp: , Rfl:    TRINTELLIX 20 MG TABS tablet, Take 20 mg by mouth daily., Disp: , Rfl:    TRULICITY 4.5 LO/7.5IE SOPN, , Disp: , Rfl:    valsartan-hydrochlorothiazide (DIOVAN-HCT) 160-25 MG tablet, Take by mouth  daily., Disp: , Rfl:    ergocalciferol (VITAMIN D2) 1.25 MG (50000 UT) capsule, Take 50,000 Units by mouth once a week., Disp: , Rfl:    ibuprofen (ADVIL) 800 MG tablet, Take 800 mg by mouth every 8 (eight) hours as needed., Disp: , Rfl:    Family History  Problem Relation Age of Onset   Breast cancer Neg Hx    Kidney disease Neg Hx      Social History   Tobacco Use   Smoking status: Never   Smokeless tobacco: Never  Vaping Use   Vaping Use: Never used  Substance Use Topics   Alcohol use: Yes    Alcohol/week: 2.0 standard drinks of alcohol    Types: 1 Glasses of wine, 1 Shots of liquor per week   Drug use: No    Allergies as of 04/24/2022 - Review Complete 04/24/2022  Allergen Reaction Noted   Ibuprofen Other (See Comments) 09/27/2015   Metronidazole Swelling and Other (See Comments) 09/27/2015   Phenyltoloxamine-acetaminophen Hives 10/10/2015   Other Nausea And Vomiting 09/27/2015   Septra [sulfamethoxazole-trimethoprim] Nausea And Vomiting 12/15/2014    Review of Systems:    All systems reviewed and negative except where noted in HPI.   Physical Exam:  BP 121/82 (BP Location: Left Arm, Patient Position: Sitting, Cuff Size: Large)   Pulse 80   Temp (!) 97.5 F (36.4 C) (Oral)   Ht '5\' 3"'$  (1.6 m)   Wt 266 lb 4 oz (120.8 kg)   BMI 47.16 kg/m  No LMP recorded. Patient has had a hysterectomy.  General:   Alert,  Well-developed, well-nourished, pleasant and cooperative in NAD Head:  Normocephalic and atraumatic. Eyes:  Sclera clear, no icterus.   Conjunctiva pink. Ears:  Normal auditory acuity. Nose:  No deformity, discharge, or lesions. Mouth:  No deformity or lesions,oropharynx pink & moist. Neck:  Supple; no masses or thyromegaly. Lungs:  Respirations even and unlabored.  Clear throughout to auscultation.   No wheezes, crackles, or rhonchi. No acute distress. Heart:  Regular rate and rhythm; no murmurs, clicks, rubs, or gallops. Abdomen:  Normal bowel sounds.  Soft, non-tender and non-distended without masses, hepatosplenomegaly or hernias noted.  No guarding or rebound tenderness.   Rectal: Not performed Msk:  Symmetrical without gross deformities. Good, equal movement & strength bilaterally. Pulses:  Normal pulses noted. Extremities:  No clubbing or edema.  No cyanosis. Neurologic:  Alert and oriented x3;  grossly normal neurologically. Skin:  Intact without significant lesions or rashes. No jaundice. Psych:  Alert and cooperative. Normal mood and affect.  Imaging Studies: Reviewed  Assessment and Plan:   Wenona P Tieken is a 51 y.o. pleasant female with metabolic syndrome is seen in consultation for intermittent episodes  of nausea and nonbloody, nonbilious emesis, currently on low-dose PPI  Recommend EGD with gastric biopsies Continue low-dose omeprazole If EGD is unremarkable, recommend gastric emptying study to evaluate for gastroparesis  Colon cancer screening Discussed about screening colonoscopy and patient is agreeable  Normocytic anemia Check CBC, iron panel, B12 and folate levels  I have discussed alternative options, risks & benefits,  which include, but are not limited to, bleeding, infection, perforation,respiratory complication & drug reaction.  The patient agrees with this plan & written consent will be obtained.     Follow up based on the above workup   Cephas Darby, MD

## 2022-04-25 DIAGNOSIS — R69 Illness, unspecified: Secondary | ICD-10-CM | POA: Diagnosis not present

## 2022-04-25 DIAGNOSIS — F332 Major depressive disorder, recurrent severe without psychotic features: Secondary | ICD-10-CM | POA: Diagnosis not present

## 2022-04-25 LAB — COMPREHENSIVE METABOLIC PANEL
ALT: 6 IU/L (ref 0–32)
AST: 12 IU/L (ref 0–40)
Albumin/Globulin Ratio: 1.2 (ref 1.2–2.2)
Albumin: 4.2 g/dL (ref 3.9–4.9)
Alkaline Phosphatase: 79 IU/L (ref 44–121)
BUN/Creatinine Ratio: 12 (ref 9–23)
BUN: 15 mg/dL (ref 6–24)
Bilirubin Total: 1 mg/dL (ref 0.0–1.2)
CO2: 27 mmol/L (ref 20–29)
Calcium: 9.9 mg/dL (ref 8.7–10.2)
Chloride: 97 mmol/L (ref 96–106)
Creatinine, Ser: 1.21 mg/dL — ABNORMAL HIGH (ref 0.57–1.00)
Globulin, Total: 3.6 g/dL (ref 1.5–4.5)
Glucose: 83 mg/dL (ref 70–99)
Potassium: 4 mmol/L (ref 3.5–5.2)
Sodium: 137 mmol/L (ref 134–144)
Total Protein: 7.8 g/dL (ref 6.0–8.5)
eGFR: 55 mL/min/{1.73_m2} — ABNORMAL LOW (ref >59)

## 2022-04-25 LAB — B12 AND FOLATE PANEL
Folate: 16.1 ng/mL (ref >3.0)
Vitamin B-12: 1834 pg/mL — ABNORMAL HIGH (ref 232–1245)

## 2022-04-25 LAB — CBC
Hematocrit: 38.4 % (ref 34.0–46.6)
Hemoglobin: 12.6 g/dL (ref 11.1–15.9)
MCH: 28.8 pg (ref 26.6–33.0)
MCHC: 32.8 g/dL (ref 31.5–35.7)
MCV: 88 fL (ref 79–97)
Platelets: 325 10*3/uL (ref 150–450)
RBC: 4.38 x10E6/uL (ref 3.77–5.28)
RDW: 13.5 % (ref 11.7–15.4)
WBC: 7.6 10*3/uL (ref 3.4–10.8)

## 2022-04-25 LAB — IRON,TIBC AND FERRITIN PANEL
Ferritin: 96 ng/mL (ref 15–150)
Iron Saturation: 19 % (ref 15–55)
Iron: 62 ug/dL (ref 27–159)
Total Iron Binding Capacity: 328 ug/dL (ref 250–450)
UIBC: 266 ug/dL (ref 131–425)

## 2022-05-02 ENCOUNTER — Ambulatory Visit: Payer: Medicare HMO | Admitting: Certified Registered"

## 2022-05-02 ENCOUNTER — Encounter: Payer: Self-pay | Admitting: Gastroenterology

## 2022-05-02 ENCOUNTER — Encounter
Admission: RE | Disposition: A | Discharge status: Home / Self Care | Payer: Self-pay | Source: Home / Self Care | Attending: Gastroenterology

## 2022-05-02 ENCOUNTER — Ambulatory Visit
Admission: RE | Admit: 2022-05-02 | Discharge: 2022-05-02 | Disposition: A | Discharge status: Home / Self Care | Payer: Medicare HMO | Attending: Gastroenterology | Admitting: Gastroenterology

## 2022-05-02 DIAGNOSIS — R69 Illness, unspecified: Secondary | ICD-10-CM | POA: Diagnosis not present

## 2022-05-02 DIAGNOSIS — B9681 Helicobacter pylori [H. pylori] as the cause of diseases classified elsewhere: Secondary | ICD-10-CM | POA: Diagnosis not present

## 2022-05-02 DIAGNOSIS — K635 Polyp of colon: Secondary | ICD-10-CM

## 2022-05-02 DIAGNOSIS — J45909 Unspecified asthma, uncomplicated: Secondary | ICD-10-CM | POA: Diagnosis not present

## 2022-05-02 DIAGNOSIS — E119 Type 2 diabetes mellitus without complications: Secondary | ICD-10-CM | POA: Insufficient documentation

## 2022-05-02 DIAGNOSIS — F32A Depression, unspecified: Secondary | ICD-10-CM | POA: Insufficient documentation

## 2022-05-02 DIAGNOSIS — D126 Benign neoplasm of colon, unspecified: Secondary | ICD-10-CM | POA: Diagnosis not present

## 2022-05-02 DIAGNOSIS — F419 Anxiety disorder, unspecified: Secondary | ICD-10-CM | POA: Insufficient documentation

## 2022-05-02 DIAGNOSIS — K3189 Other diseases of stomach and duodenum: Secondary | ICD-10-CM | POA: Insufficient documentation

## 2022-05-02 DIAGNOSIS — I10 Essential (primary) hypertension: Secondary | ICD-10-CM | POA: Diagnosis not present

## 2022-05-02 DIAGNOSIS — E039 Hypothyroidism, unspecified: Secondary | ICD-10-CM | POA: Diagnosis not present

## 2022-05-02 DIAGNOSIS — K319 Disease of stomach and duodenum, unspecified: Secondary | ICD-10-CM | POA: Diagnosis not present

## 2022-05-02 DIAGNOSIS — Z1211 Encounter for screening for malignant neoplasm of colon: Secondary | ICD-10-CM

## 2022-05-02 DIAGNOSIS — R1013 Epigastric pain: Secondary | ICD-10-CM | POA: Diagnosis not present

## 2022-05-02 DIAGNOSIS — E785 Hyperlipidemia, unspecified: Secondary | ICD-10-CM | POA: Diagnosis not present

## 2022-05-02 DIAGNOSIS — G4730 Sleep apnea, unspecified: Secondary | ICD-10-CM | POA: Insufficient documentation

## 2022-05-02 DIAGNOSIS — D125 Benign neoplasm of sigmoid colon: Secondary | ICD-10-CM | POA: Diagnosis not present

## 2022-05-02 DIAGNOSIS — K2900 Acute gastritis without bleeding: Secondary | ICD-10-CM | POA: Diagnosis not present

## 2022-05-02 DIAGNOSIS — K2950 Unspecified chronic gastritis without bleeding: Secondary | ICD-10-CM | POA: Insufficient documentation

## 2022-05-02 DIAGNOSIS — D123 Benign neoplasm of transverse colon: Secondary | ICD-10-CM | POA: Insufficient documentation

## 2022-05-02 DIAGNOSIS — Z6841 Body Mass Index (BMI) 40.0 and over, adult: Secondary | ICD-10-CM | POA: Diagnosis not present

## 2022-05-02 HISTORY — DX: Type 2 diabetes mellitus without complications: E11.9

## 2022-05-02 HISTORY — PX: ESOPHAGOGASTRODUODENOSCOPY (EGD) WITH PROPOFOL: SHX5813

## 2022-05-02 HISTORY — PX: COLONOSCOPY WITH PROPOFOL: SHX5780

## 2022-05-02 LAB — GLUCOSE, CAPILLARY: Glucose-Capillary: 90 mg/dL (ref 70–99)

## 2022-05-02 SURGERY — COLONOSCOPY WITH PROPOFOL
Anesthesia: General

## 2022-05-02 MED ORDER — LIDOCAINE HCL (CARDIAC) PF 100 MG/5ML IV SOSY
PREFILLED_SYRINGE | INTRAVENOUS | Status: DC | PRN
  Administered 2022-05-02: 50 mg via INTRAVENOUS

## 2022-05-02 MED ORDER — PROPOFOL 10 MG/ML IV BOLUS
INTRAVENOUS | Status: DC | PRN
  Administered 2022-05-02: 60 mg via INTRAVENOUS

## 2022-05-02 MED ORDER — PROPOFOL 1000 MG/100ML IV EMUL
INTRAVENOUS | Status: AC
  Filled 2022-05-02: qty 100

## 2022-05-02 MED ORDER — PROPOFOL 500 MG/50ML IV EMUL
INTRAVENOUS | Status: DC | PRN
  Administered 2022-05-02: 150 ug/kg/min via INTRAVENOUS

## 2022-05-02 MED ORDER — SODIUM CHLORIDE 0.9 % IV SOLN: INTRAVENOUS | Status: DC

## 2022-05-02 NOTE — Transfer of Care (Signed)
Immediate Anesthesia Transfer of Care Note  Patient: Candice Watkins  Procedure(s) Performed: COLONOSCOPY WITH PROPOFOL ESOPHAGOGASTRODUODENOSCOPY (EGD) WITH PROPOFOL  Patient Location: PACU and Endoscopy Unit  Anesthesia Type:General  Level of Consciousness: awake, drowsy, and patient cooperative  Airway & Oxygen Therapy: Patient Spontanous Breathing  Post-op Assessment: Report given to RN and Post -op Vital signs reviewed and stable  Post vital signs: Reviewed and stable  Last Vitals:  Vitals Value Taken Time  BP 112/77 05/02/22 1052  Temp 35.8 C 05/02/22 1052  Pulse 90 05/02/22 1052  Resp 21 05/02/22 1052  SpO2 100 % 05/02/22 1052  Vitals shown include unvalidated device data.  Last Pain:  Vitals:   05/02/22 1052  TempSrc: Temporal         Complications: No notable events documented.

## 2022-05-02 NOTE — Anesthesia Preprocedure Evaluation (Signed)
Anesthesia Evaluation  Patient identified by MRN, date of birth, ID band Patient awake    Reviewed: Allergy & Precautions, H&P , NPO status , Patient's Chart, lab work & pertinent test results, reviewed documented beta blocker date and time   History of Anesthesia Complications (+) PONV and history of anesthetic complications  Airway Mallampati: III   Neck ROM: full    Dental  (+) Poor Dentition   Pulmonary asthma , sleep apnea    Pulmonary exam normal        Cardiovascular Exercise Tolerance: Good hypertension, On Medications negative cardio ROS Normal cardiovascular exam Rhythm:regular Rate:Normal     Neuro/Psych  Headaches  Anxiety Depression     negative psych ROS   GI/Hepatic Neg liver ROS, PUD,,,  Endo/Other  diabetes, Well ControlledHypothyroidism    Renal/GU Renal disease  negative genitourinary   Musculoskeletal   Abdominal   Peds  Hematology negative hematology ROS (+)   Anesthesia Other Findings Past Medical History: No date: Anxiety No date: Arthritis     Comment:  "knees, ankles" (10/11/2015) No date: Asthma No date: Depression No date: Diabetes mellitus without complication (HCC) No date: Heartburn No date: HLD (hyperlipidemia) No date: Hypertension No date: Hypothyroidism No date: Kidney stones No date: Migraine     Comment:  "monthly" (10/11/2015) X 1: PONV (postoperative nausea and vomiting) No date: Primary hyperparathyroidism ([place]) No date: Seasonal allergies No date: Sleep apnea No date: Stomach ulcer Past Surgical History: No date: BREAST BIOPSY; Bilateral     Comment:  neg- core 2000: CESAREAN SECTION No date: JOINT REPLACEMENT No date: KNEE ARTHROSCOPY; Right No date: THYROID SURGERY     Comment:  "removed tumor" 10/11/2015: TOTAL KNEE ARTHROPLASTY; Right 10/11/2015: TOTAL KNEE ARTHROPLASTY; Right     Comment:  Procedure: TOTAL KNEE ARTHROPLASTY;  Surgeon: Ninetta Lights, MD;  Location: [facility];  Service: Orthopedics;                Laterality: Right; No date: TUBAL LIGATION No date: VAGINAL HYSTERECTOMY BMI    Body Mass Index: 45.49 kg/m     Reproductive/Obstetrics negative OB ROS                             Anesthesia Physical Anesthesia Plan  ASA: 3  Anesthesia Plan: General   Post-op Pain Management:    Induction:   PONV Risk Score and Plan:   Airway Management Planned:   Additional Equipment:   Intra-op Plan:   Post-operative Plan:   Informed Consent: I have reviewed the patients History and Physical, chart, labs and discussed the procedure including the risks, benefits and alternatives for the proposed anesthesia with the patient or authorized representative who has indicated his/her understanding and acceptance.     Dental Advisory Given  Plan Discussed with: CRNA  Anesthesia Plan Comments:        Anesthesia Quick Evaluation

## 2022-05-02 NOTE — H&P (Signed)
Candice Darby, MD [address]  [place]  [place], [place] [address]  Main: [phone]  Fax: [phone] Pager: [phone]  Primary Care Physician:  Perrin Maltese, MD Primary Gastroenterologist:  Dr. Cephas Watkins  Pre-Procedure History & Physical: HPI:  Candice Watkins is a 51 y.o. female is here for an endoscopy and colonoscopy.   Past Medical History:  Diagnosis Date   Anxiety    Arthritis    "knees, ankles" (10/11/2015)   Asthma    Depression    Diabetes mellitus without complication ([place])    Heartburn    HLD (hyperlipidemia)    Hypertension    Hypothyroidism    Kidney stones    Migraine    "monthly" (10/11/2015)   PONV (postoperative nausea and vomiting) X 1   Primary hyperparathyroidism ([place])    Seasonal allergies    Sleep apnea    Stomach ulcer     Past Surgical History:  Procedure Laterality Date   BREAST BIOPSY Bilateral    neg- core   CESAREAN SECTION  2000   JOINT REPLACEMENT     KNEE ARTHROSCOPY Right    THYROID SURGERY     "removed tumor"   TOTAL KNEE ARTHROPLASTY Right 10/11/2015   TOTAL KNEE ARTHROPLASTY Right 10/11/2015   Procedure: TOTAL KNEE ARTHROPLASTY;  Surgeon: Ninetta Lights, MD;  Location: [place] [facility];  Service: Orthopedics;  Laterality: Right;   TUBAL LIGATION     VAGINAL HYSTERECTOMY      Prior to Admission medications   Medication Sig Start Date End Date Taking? Authorizing Provider  amLODipine (NORVASC) 10 MG tablet Take 1 tablet (10 mg total) by mouth daily. For high blood pressure 02/01/17  Yes Money, Lowry Ram, FNP  budesonide-formoterol (SYMBICORT) 80-4.5 MCG/ACT inhaler Inhale 2 puffs into the lungs 2 (two) times daily.   Yes [provider]  cetirizine (ZYRTEC) 10 MG chewable tablet Chew 10 mg by mouth at bedtime.   Yes [provider]  enalapril-hydrochlorothiazide (VASERETIC) 10-25 MG tablet Take 0.5 tablets by mouth daily.   Yes [provider]  lisinopril-hydrochlorothiazide  (ZESTORETIC) 10-12.5 MG tablet Take 1 tablet by mouth daily. 08/24/13  Yes [provider]  rosuvastatin (CRESTOR) 10 MG tablet Take 10 mg by mouth daily. 01/15/22  Yes [provider]  TRINTELLIX 20 MG TABS tablet Take 20 mg by mouth daily. 11/30/21  Yes [provider]  valsartan-hydrochlorothiazide (DIOVAN-HCT) 160-25 MG tablet Take by mouth daily. 12/28/21  Yes [provider]  Azelastine HCl 137 MCG/SPRAY SOLN  03/03/22   [provider]  ergocalciferol (VITAMIN D2) 1.25 MG (50000 UT) capsule Take 50,000 Units by mouth once a week.    [provider]  fexofenadine (ALLEGRA) 180 MG tablet Take 180 mg by mouth daily. 10/31/09   [provider]  hydrOXYzine (ATARAX/VISTARIL) 25 MG tablet Take 1 tablet (25 mg total) by mouth 3 (three) times daily as needed for anxiety. 01/31/17   Money, Lowry Ram, FNP  ibuprofen (ADVIL) 800 MG tablet Take 800 mg by mouth every 8 (eight) hours as needed.    [provider]  levothyroxine (SYNTHROID) 112 MCG tablet Take 112 mcg by mouth every morning. 04/12/22   [provider]  methocarbamol (ROBAXIN) 500 MG tablet Take 1 tablet (500 mg total) by mouth 2 (two) times daily. 08/02/20   Muthersbaugh, Jarrett Soho, PA-C  metoprolol succinate (TOPROL-XL) 50 MG 24 hr tablet Take 50 mg by mouth daily. 01/15/22   [provider]  omeprazole (PRILOSEC) 20 MG capsule  03/04/22   [provider]  TRULICITY 4.5 IP/3.8SN SOPN  03/19/22   [provider]    Allergies as of 04/24/2022 - Review Complete 04/24/2022  Allergen Reaction Noted   Ibuprofen Other (See Comments) 09/27/2015   Metronidazole Swelling and Other (See Comments) 09/27/2015   Phenyltoloxamine-acetaminophen Hives 10/10/2015   Other Nausea And Vomiting 09/27/2015   Septra [sulfamethoxazole-trimethoprim] Nausea And Vomiting 12/15/2014    Family History  Problem Relation Age of Onset   Breast cancer Neg Hx    Kidney  disease Neg Hx     Social History   Socioeconomic History   Marital status: Single    Spouse name: Not on file   Number of children: Not on file   Years of education: Not on file   Highest education level: Not on file  Occupational History   Not on file  Tobacco Use   Smoking status: Never   Smokeless tobacco: Never  Vaping Use   Vaping Use: Never used  Substance and Sexual Activity   Alcohol use: Yes    Alcohol/week: 2.0 standard drinks of alcohol    Types: 1 Glasses of wine, 1 Shots of liquor per week   Drug use: No   Sexual activity: Yes    Birth control/protection: None    Comment: hysterectomy  Other Topics Concern   Not on file  Social History Narrative   Not on file   Social Determinants of Health   Financial Resource Strain: Not on file  Food Insecurity: Not on file  Transportation Needs: Not on file  Physical Activity: Not on file  Stress: Not on file  Social Connections: Not on file  Intimate Partner Violence: Not on file    Review of Systems: See HPI, otherwise negative ROS  Physical Exam: BP (!) 146/100   Pulse 90   Temp (!) 96.5 F (35.8 C) (Temporal)   Resp 18   Ht '5\' 4"'$  (1.626 m)   Wt 120.2 kg   SpO2 99%   BMI 45.49 kg/m  General:   Alert,  pleasant and cooperative in NAD Head:  Normocephalic and atraumatic. Neck:  Supple; no masses or thyromegaly. Lungs:  Clear throughout to auscultation.    Heart:  Regular rate and rhythm. Abdomen:  Soft, nontender and nondistended. Normal bowel sounds, without guarding, and without rebound.   Neurologic:  Alert and  oriented x4;  grossly normal neurologically.  Impression/Plan: [name] is here for an endoscopy and colonoscopy to be performed for nausea, vomiting, colon cancer screening  Risks, benefits, limitations, and alternatives regarding  endoscopy and colonoscopy have been reviewed with the patient.  Questions have been answered.  All parties agreeable.   Sherri Sear, MD   05/02/2022, 9:32 AM

## 2022-05-02 NOTE — Op Note (Signed)
[facility] Patient Name: Candice Watkins Procedure Date: 05/02/2022 10:03 AM MRN: [id number] Account #: [id number] Date of Birth: Jul 28, 1970 Admit Type: Outpatient Age: 51 Room: [facility] ENDO ROOM 4 Gender: Female Note Status: Finalized Instrument Name: Jasper Riling [id number] Procedure:             Colonoscopy Indications:           Screening for colorectal malignant neoplasm, This is                         the patient's first colonoscopy Providers:             Lin Landsman MD, MD Referring MD:          Perrin Maltese, MD (Referring MD) Medicines:             General Anesthesia Complications:         No immediate complications. Estimated blood loss: None. Procedure:             Pre-Anesthesia Assessment:                        - Prior to the procedure, a History and Physical was                         performed, and patient medications and allergies were                         reviewed. The patient is competent. The risks and                         benefits of the procedure and the sedation options and                         risks were discussed with the patient. All questions                         were answered and informed consent was obtained.                         Patient identification and proposed procedure were                         verified by the physician, the nurse, the                         anesthesiologist, the anesthetist and the technician                         in the pre-procedure area in the procedure room in the                         endoscopy suite. Mental Status Examination: alert and                         oriented. Airway Examination: normal oropharyngeal                         airway and neck mobility. Respiratory Examination:  clear to auscultation. CV Examination: normal.                         Prophylactic Antibiotics: The patient does not require                          prophylactic antibiotics. Prior Anticoagulants: The                         patient has taken no anticoagulant or antiplatelet                         agents. ASA Grade Assessment: III - A patient with                         severe systemic disease. After reviewing the risks and                         benefits, the patient was deemed in satisfactory                         condition to undergo the procedure. The anesthesia                         plan was to use general anesthesia. Immediately prior                         to administration of medications, the patient was                         re-assessed for adequacy to receive sedatives. The                         heart rate, respiratory rate, oxygen saturations,                         blood pressure, adequacy of pulmonary ventilation, and                         response to care were monitored throughout the                         procedure. The physical status of the patient was                         re-assessed after the procedure.                        After obtaining informed consent, the colonoscope was                         passed under direct vision. Throughout the procedure,                         the patient's blood pressure, pulse, and oxygen                         saturations were monitored continuously. The  Colonoscope was introduced through the anus and                         advanced to the the cecum, identified by appendiceal                         orifice and ileocecal valve. The colonoscopy was                         performed without difficulty. The patient tolerated                         the procedure well. The quality of the bowel                         preparation was evaluated using the BBPS [facility] Bowel                         Preparation Scale) with scores of: Right Colon = 1                         (portion of mucosa seen, but other areas not well seen                          due to staining, residual stool and/or opaque liquid),                         Transverse Colon = 3 (entire mucosa seen well with no                         residual staining, small fragments of stool or opaque                         liquid) and Left Colon = 3 (entire mucosa seen well                         with no residual staining, small fragments of stool or                         opaque liquid). The total BBPS score equals 7. The                         ileocecal valve, appendiceal orifice, and rectum were                         photographed. Findings:      The perianal and digital rectal examinations were normal. Pertinent       negatives include normal sphincter tone and no palpable rectal lesions.      Two sessile polyps were found in the sigmoid colon and transverse colon.       The polyps were 4 to 5 mm in size. These polyps were removed with a cold       snare. Resection and retrieval were complete. Estimated blood loss: none.      The retroflexed view of the distal rectum and anal verge was normal and       showed no anal or  rectal abnormalities.      Extensive amounts of semi-liquid stool was found in the ascending colon       and in the cecum, precluding visualization. Impression:            - Two 4 to 5 mm polyps in the sigmoid colon and in the                         transverse colon, removed with a cold snare. Resected                         and retrieved.                        - The distal rectum and anal verge are normal on                         retroflexion view.                        - Stool in the ascending colon and in the cecum. Recommendation:        - Discharge patient to home (with escort).                        - Resume previous diet today.                        - Continue present medications.                        - Await pathology results.                        - Repeat colonoscopy at next available appointment                          (within 3 months) because the bowel preparation was                         suboptimal with 2 day prep. Procedure Code(s):     --- Professional ---                        [phone], Colonoscopy, flexible; with removal of                         tumor(s), polyp(s), or other lesion(s) by snare                         technique Diagnosis Code(s):     --- Professional ---                        Z12.11, Encounter for screening for malignant neoplasm                         of colon                        D12.5, Benign neoplasm of sigmoid colon  D12.3, Benign neoplasm of transverse colon (hepatic                         flexure or splenic flexure) CPT copyright 2022 American [employer] Association. All rights reserved. The codes documented in this report are preliminary and upon coder review may  be revised to meet current compliance requirements. Dr. Ulyess Mort Lin Landsman MD, MD 05/02/2022 10:48:53 AM This report has been signed electronically. Number of Addenda: 0 Note Initiated On: 05/02/2022 10:03 AM Scope Withdrawal Time: 0 hours 13 minutes 6 seconds  Total Procedure Duration: 0 hours 16 minutes 46 seconds  Estimated Blood Loss:  Estimated blood loss: none.      [facility]

## 2022-05-02 NOTE — Op Note (Signed)
[facility] Gastroenterology Patient Name: Candice Watkins Procedure Date: 05/02/2022 10:04 AM MRN: [id number] Account #: [id number] Date of Birth: Feb 09, 1971 Admit Type: Outpatient Age: 51 Room: [facility] ENDO ROOM 4 Gender: Female Note Status: Finalized Instrument Name: Upper Endoscope [id number] Procedure:             Upper GI endoscopy Indications:           Dyspepsia Providers:             Lin Landsman MD, MD Referring MD:          Perrin Maltese, MD (Referring MD) Medicines:             General Anesthesia Complications:         No immediate complications. Estimated blood loss: None. Procedure:             Pre-Anesthesia Assessment:                        - Prior to the procedure, a History and Physical was                         performed, and patient medications and allergies were                         reviewed. The patient is competent. The risks and                         benefits of the procedure and the sedation options and                         risks were discussed with the patient. All questions                         were answered and informed consent was obtained.                         Patient identification and proposed procedure were                         verified by the physician, the nurse, the                         anesthesiologist, the anesthetist and the technician                         in the pre-procedure area in the procedure room in the                         endoscopy suite. Mental Status Examination: alert and                         oriented. Airway Examination: normal oropharyngeal                         airway and neck mobility. Respiratory Examination:                         clear to auscultation. CV Examination: normal.  Prophylactic Antibiotics: The patient does not require                         prophylactic antibiotics. Prior Anticoagulants: The                         patient has  taken no anticoagulant or antiplatelet                         agents. ASA Grade Assessment: III - A patient with                         severe systemic disease. After reviewing the risks and                         benefits, the patient was deemed in satisfactory                         condition to undergo the procedure. The anesthesia                         plan was to use general anesthesia. Immediately prior                         to administration of medications, the patient was                         re-assessed for adequacy to receive sedatives. The                         heart rate, respiratory rate, oxygen saturations,                         blood pressure, adequacy of pulmonary ventilation, and                         response to care were monitored throughout the                         procedure. The physical status of the patient was                         re-assessed after the procedure.                        After obtaining informed consent, the endoscope was                         passed under direct vision. Throughout the procedure,                         the patient's blood pressure, pulse, and oxygen                         saturations were monitored continuously. The Endoscope                         was introduced through the mouth, and advanced to the  second part of duodenum. The upper GI endoscopy was                         accomplished without difficulty. The patient tolerated                         the procedure well. Findings:      The duodenal bulb and second portion of the duodenum were normal.      Striped moderately erythematous mucosa without bleeding was found in the       gastric body. Biopsies were taken with a cold forceps for histology.      The cardia and gastric fundus were normal on retroflexion.      The incisura and gastric antrum were normal. Biopsies were taken with a       cold forceps for Helicobacter pylori  testing.      The gastroesophageal junction and examined esophagus were normal.      Esophagogastric landmarks were identified: the gastroesophageal junction       was found at 35 cm from the incisors. Impression:            - Normal duodenal bulb and second portion of the                         duodenum.                        - Erythematous mucosa in the gastric body. Biopsied.                        - Normal incisura and antrum. Biopsied.                        - Normal gastroesophageal junction and esophagus.                        - Esophagogastric landmarks identified. Recommendation:        - Discharge patient to home (with escort).                        - Resume previous diet today.                        - Continue present medications.                        - Await pathology results.                        - Proceed with colonoscopy as scheduled                        See colonoscopy report Procedure Code(s):     --- Professional ---                        [phone], Esophagogastroduodenoscopy, flexible,                         transoral; with biopsy, single or multiple Diagnosis Code(s):     --- Professional ---  K31.89, Other diseases of stomach and duodenum                        R10.13, Epigastric pain CPT copyright 2022 American [employer] Association. All rights reserved. The codes documented in this report are preliminary and upon coder review may  be revised to meet current compliance requirements. Dr. Ulyess Mort Lin Landsman MD, MD 05/02/2022 10:27:28 AM This report has been signed electronically. Number of Addenda: 0 Note Initiated On: 05/02/2022 10:04 AM Estimated Blood Loss:  Estimated blood loss: none.      [facility]

## 2022-05-02 NOTE — Anesthesia Postprocedure Evaluation (Signed)
Anesthesia Post Note  Patient: Finnlee P Mireles  Procedure(s) Performed: COLONOSCOPY WITH PROPOFOL ESOPHAGOGASTRODUODENOSCOPY (EGD) WITH PROPOFOL  Patient location during evaluation: PACU Anesthesia Type: General Level of consciousness: awake and alert Pain management: pain level controlled Vital Signs Assessment: post-procedure vital signs reviewed and stable Respiratory status: spontaneous breathing, nonlabored ventilation, respiratory function stable and patient connected to nasal cannula oxygen Cardiovascular status: blood pressure returned to baseline and stable Postop Assessment: no apparent nausea or vomiting Anesthetic complications: no   No notable events documented.   Last Vitals:  Vitals:   05/02/22 0855 05/02/22 1052  BP: (!) 146/100 112/77  Pulse: 90 94  Resp: 18 (!) 21  Temp: (!) 35.8 C (!) 35.8 C  SpO2: 99% 100%    Last Pain:  Vitals:   05/02/22 1052  TempSrc: Temporal  PainSc: 0-No pain                 Molli Barrows

## 2022-05-03 ENCOUNTER — Encounter: Payer: Self-pay | Admitting: Gastroenterology

## 2022-05-06 ENCOUNTER — Encounter: Payer: Self-pay | Admitting: Gastroenterology

## 2022-05-06 LAB — SURGICAL PATHOLOGY

## 2022-05-07 ENCOUNTER — Other Ambulatory Visit: Payer: Self-pay

## 2022-05-07 ENCOUNTER — Telehealth: Payer: Self-pay

## 2022-05-07 DIAGNOSIS — Z86010 Personal history of colonic polyps: Secondary | ICD-10-CM

## 2022-05-07 DIAGNOSIS — A048 Other specified bacterial intestinal infections: Secondary | ICD-10-CM

## 2022-05-07 MED ORDER — OMEPRAZOLE 20 MG PO CPDR: 20.0000 mg | DELAYED_RELEASE_CAPSULE | Freq: Two times a day (BID) | ORAL | 0 refills | Status: DC

## 2022-05-07 MED ORDER — NA SULFATE-K SULFATE-MG SULF 17.5-3.13-1.6 GM/177ML PO SOLN: 354.0000 mL | Freq: Once | ORAL | 0 refills | Status: AC

## 2022-05-07 MED ORDER — AMOXICILLIN 500 MG PO TABS: 1000.0000 mg | ORAL_TABLET | Freq: Two times a day (BID) | ORAL | 0 refills | Status: AC

## 2022-05-07 MED ORDER — CLARITHROMYCIN 500 MG PO TABS: 500.0000 mg | ORAL_TABLET | Freq: Two times a day (BID) | ORAL | 0 refills | Status: AC

## 2022-05-07 NOTE — Telephone Encounter (Signed)
-----   Message from Lin Landsman, MD sent at 05/06/2022  7:20 PM EST ----- Please inform patient that the pathology results from upper endoscopy confirm H. pylori gastritis Which explains her symptoms Recommend treatment for it, please send in the prescription for triple therapy to treat H Pylori for 14days  Omeprazole '20mg'$  BID Clarithromycin '500mg'$  BID Amoxicillin 1gm BID  Order H Pylori breath test in 4weeks after completing medication to confirm eradication.  Patient should be off prilosec and H2 blocker atleast for 2weeks before the test  Thanks RV

## 2022-05-07 NOTE — Telephone Encounter (Signed)
Patient verbalized understanding of results. Sent medication to the pharmacy. Order lab test. She ask for [place] to call her and remind her to come for labs. Put in a reminder

## 2022-05-18 ENCOUNTER — Other Ambulatory Visit: Payer: Self-pay

## 2022-05-18 ENCOUNTER — Emergency Department (HOSPITAL_COMMUNITY): Payer: Medicare HMO

## 2022-05-18 ENCOUNTER — Emergency Department (HOSPITAL_COMMUNITY)
Admission: EM | Admit: 2022-05-18 | Discharge: 2022-05-18 | Disposition: A | Discharge status: Home / Self Care | Payer: Medicare HMO | Attending: Emergency Medicine | Admitting: Emergency Medicine

## 2022-05-18 ENCOUNTER — Encounter (HOSPITAL_COMMUNITY): Payer: Self-pay

## 2022-05-18 DIAGNOSIS — D72829 Elevated white blood cell count, unspecified: Secondary | ICD-10-CM | POA: Diagnosis not present

## 2022-05-18 DIAGNOSIS — R1111 Vomiting without nausea: Secondary | ICD-10-CM | POA: Diagnosis not present

## 2022-05-18 DIAGNOSIS — Z79899 Other long term (current) drug therapy: Secondary | ICD-10-CM | POA: Insufficient documentation

## 2022-05-18 DIAGNOSIS — E860 Dehydration: Secondary | ICD-10-CM | POA: Insufficient documentation

## 2022-05-18 DIAGNOSIS — I10 Essential (primary) hypertension: Secondary | ICD-10-CM | POA: Insufficient documentation

## 2022-05-18 DIAGNOSIS — R110 Nausea: Secondary | ICD-10-CM | POA: Diagnosis not present

## 2022-05-18 DIAGNOSIS — R109 Unspecified abdominal pain: Secondary | ICD-10-CM | POA: Diagnosis not present

## 2022-05-18 DIAGNOSIS — R112 Nausea with vomiting, unspecified: Secondary | ICD-10-CM | POA: Insufficient documentation

## 2022-05-18 DIAGNOSIS — R1013 Epigastric pain: Secondary | ICD-10-CM | POA: Insufficient documentation

## 2022-05-18 DIAGNOSIS — K2970 Gastritis, unspecified, without bleeding: Secondary | ICD-10-CM | POA: Diagnosis not present

## 2022-05-18 DIAGNOSIS — R1084 Generalized abdominal pain: Secondary | ICD-10-CM | POA: Diagnosis not present

## 2022-05-18 LAB — CBC
HCT: 35.3 % — ABNORMAL LOW (ref 36.0–46.0)
Hemoglobin: 12.1 g/dL (ref 12.0–15.0)
MCH: 30.3 pg (ref 26.0–34.0)
MCHC: 34.3 g/dL (ref 30.0–36.0)
MCV: 88.5 fL (ref 80.0–100.0)
Platelets: 275 10*3/uL (ref 150–400)
RBC: 3.99 MIL/uL (ref 3.87–5.11)
RDW: 13.8 % (ref 11.5–15.5)
WBC: 10.6 10*3/uL — ABNORMAL HIGH (ref 4.0–10.5)
nRBC: 0 % (ref 0.0–0.2)

## 2022-05-18 LAB — COMPREHENSIVE METABOLIC PANEL
ALT: 13 U/L (ref 0–44)
AST: 16 U/L (ref 15–41)
Albumin: 3.3 g/dL — ABNORMAL LOW (ref 3.5–5.0)
Alkaline Phosphatase: 54 U/L (ref 38–126)
Anion gap: 9 (ref 5–15)
BUN: 18 mg/dL (ref 6–20)
CO2: 26 mmol/L (ref 22–32)
Calcium: 9.2 mg/dL (ref 8.9–10.3)
Chloride: 102 mmol/L (ref 98–111)
Creatinine, Ser: 1.26 mg/dL — ABNORMAL HIGH (ref 0.44–1.00)
GFR, Estimated: 52 mL/min — ABNORMAL LOW (ref >60)
Glucose, Bld: 92 mg/dL (ref 70–99)
Potassium: 3.8 mmol/L (ref 3.5–5.1)
Sodium: 137 mmol/L (ref 135–145)
Total Bilirubin: 1.1 mg/dL (ref 0.3–1.2)
Total Protein: 7.1 g/dL (ref 6.5–8.1)

## 2022-05-18 LAB — LIPASE, BLOOD: Lipase: 30 U/L (ref 11–51)

## 2022-05-18 MED ORDER — ONDANSETRON HCL 4 MG/2ML IJ SOLN
4.0000 mg | Freq: Once | INTRAMUSCULAR | Status: AC
  Administered 2022-05-18: 4 mg via INTRAVENOUS
  Filled 2022-05-18: qty 2

## 2022-05-18 MED ORDER — LACTATED RINGERS IV BOLUS
1000.0000 mL | Freq: Once | INTRAVENOUS | Status: AC
  Administered 2022-05-18: 1000 mL via INTRAVENOUS

## 2022-05-18 MED ORDER — ONDANSETRON 8 MG PO TBDP: 8.0000 mg | ORAL_TABLET | Freq: Three times a day (TID) | ORAL | 0 refills | Status: DC | PRN

## 2022-05-18 MED ORDER — IOHEXOL 350 MG/ML SOLN
75.0000 mL | Freq: Once | INTRAVENOUS | Status: AC | PRN
  Administered 2022-05-18: 75 mL via INTRAVENOUS

## 2022-05-18 MED ORDER — SODIUM CHLORIDE 0.9 % IV BOLUS
1000.0000 mL | Freq: Once | INTRAVENOUS | Status: AC
  Administered 2022-05-18: 1000 mL via INTRAVENOUS

## 2022-05-18 MED ORDER — HYDROMORPHONE HCL 1 MG/ML IJ SOLN
0.5000 mg | Freq: Once | INTRAMUSCULAR | Status: AC
  Administered 2022-05-18: 0.5 mg via INTRAVENOUS
  Filled 2022-05-18: qty 1

## 2022-05-18 NOTE — ED Triage Notes (Signed)
Per EMS from home. Abd pain, n/v x2. Bacterial infection in abdomen on antibiotics for it. Headache that started x1 day. [place]. Pt sees [facility]  20G IV RAC '4mg'$  Zofran 500cc IVF  HR  80 109 CBG 98% RA RR 16 140/100 BP

## 2022-05-18 NOTE — ED Provider Notes (Addendum)
[facility] EMERGENCY DEPARTMENT Provider Note   CSN: [id number] Arrival date & time: 05/18/22  1256     History  Chief Complaint  Patient presents with   Abdominal Pain    Candice Watkins is a 51 y.o. female.  Pt with nausea, vomiting, and abd cramping. Symptoms acute onset yesterday. Emesis not bloody or bilious. Having normal bms. No abd distension. No dysuria or gu c/o. No fever or chills. No known bad food ingestion or ill contacts. Denies chest pain or discomfort. No sob. No severe headaches. No back/flank pain.   The history is provided by the patient, medical records and the EMS personnel.  Abdominal Pain Associated symptoms: nausea and vomiting   Associated symptoms: no chest pain, no chills, no cough, no dysuria, no fever, no shortness of breath and no sore throat        Home Medications Prior to Admission medications   Medication Sig Start Date End Date Taking? Authorizing Provider  amLODipine (NORVASC) 10 MG tablet Take 1 tablet (10 mg total) by mouth daily. For high blood pressure 02/01/17   Money, Lowry Ram, FNP  amoxicillin (AMOXIL) 500 MG tablet Take 2 tablets (1,000 mg total) by mouth 2 (two) times daily for 14 days. 05/07/22 05/21/22  Lin Landsman, MD  Azelastine HCl 137 MCG/SPRAY SOLN  03/03/22   [provider]  budesonide-formoterol (SYMBICORT) 80-4.5 MCG/ACT inhaler Inhale 2 puffs into the lungs 2 (two) times daily.    [provider]  cetirizine (ZYRTEC) 10 MG chewable tablet Chew 10 mg by mouth at bedtime.    [provider]  clarithromycin (BIAXIN) 500 MG tablet Take 1 tablet (500 mg total) by mouth 2 (two) times daily for 14 days. 05/07/22 05/21/22  Lin Landsman, MD  enalapril-hydrochlorothiazide (VASERETIC) 10-25 MG tablet Take 0.5 tablets by mouth daily.    [provider]  ergocalciferol (VITAMIN D2) 1.25 MG (50000 UT) capsule Take 50,000 Units by mouth once a week.    [provider]  fexofenadine (ALLEGRA) 180 MG tablet Take 180 mg by mouth daily. 10/31/09   [provider]  hydrOXYzine (ATARAX/VISTARIL) 25 MG tablet Take 1 tablet (25 mg total) by mouth 3 (three) times daily as needed for anxiety. 01/31/17   Money, Lowry Ram, FNP  ibuprofen (ADVIL) 800 MG tablet Take 800 mg by mouth every 8 (eight) hours as needed.    [provider]  levothyroxine (SYNTHROID) 112 MCG tablet Take 112 mcg by mouth every morning. 04/12/22   [provider]  lisinopril-hydrochlorothiazide (ZESTORETIC) 10-12.5 MG tablet Take 1 tablet by mouth daily. 08/24/13   [provider]  methocarbamol (ROBAXIN) 500 MG tablet Take 1 tablet (500 mg total) by mouth 2 (two) times daily. 08/02/20   Muthersbaugh, Jarrett Soho, PA-C  metoprolol succinate (TOPROL-XL) 50 MG 24 hr tablet Take 50 mg by mouth daily. 01/15/22   [provider]  omeprazole (PRILOSEC) 20 MG capsule  03/04/22   [provider]  omeprazole (PRILOSEC) 20 MG capsule Take 1 capsule (20 mg total) by mouth 2 (two) times daily before a meal for 14 days. 05/07/22 05/21/22  Lin Landsman, MD  rosuvastatin (CRESTOR) 10 MG tablet Take 10 mg by mouth daily. 01/15/22   [provider]  TRINTELLIX 20 MG TABS tablet Take 20 mg by mouth daily. 11/30/21   [provider]  TRULICITY 4.5 XK/4.8JE SOPN  03/19/22   [provider]  valsartan-hydrochlorothiazide (DIOVAN-HCT) 160-25 MG tablet Take by  mouth daily. 12/28/21   [provider]      Allergies    Ibuprofen, Metronidazole, Phenyltoloxamine-acetaminophen, Other, and Septra [sulfamethoxazole-trimethoprim]    Review of Systems   Review of Systems  Constitutional:  Negative for chills and fever.  HENT:  Negative for sore throat.   Eyes:  Negative for visual disturbance.  Respiratory:  Negative for cough and shortness of breath.   Cardiovascular:  Negative for chest pain and leg swelling.  Gastrointestinal:   Positive for abdominal pain, nausea and vomiting.  Genitourinary:  Negative for dysuria and flank pain.  Musculoskeletal:  Negative for back pain and neck pain.  Skin:  Negative for rash.  Neurological:  Negative for headaches.  Hematological:  Does not bruise/bleed easily.  Psychiatric/Behavioral:  Negative for confusion.     Physical Exam Updated Vital Signs BP (!) 146/94 (BP Location: Left Arm)   Pulse 86   Temp 98.6 F (37 C) (Oral)   Resp 18   Ht 1.626 m ('5\' 4"'$ )   Wt 120.2 kg   SpO2 100%   BMI 45.49 kg/m  Physical Exam Vitals and nursing note reviewed.  Constitutional:      Appearance: Normal appearance. She is well-developed.  HENT:     Head: Atraumatic.     Nose: Nose normal.     Mouth/Throat:     Mouth: Mucous membranes are moist.     Pharynx: Oropharynx is clear.  Eyes:     General: No scleral icterus.    Conjunctiva/sclera: Conjunctivae normal.     Pupils: Pupils are equal, round, and reactive to light.  Neck:     Trachea: No tracheal deviation.  Cardiovascular:     Rate and Rhythm: Normal rate and regular rhythm.     Pulses: Normal pulses.     Heart sounds: Normal heart sounds. No murmur heard.    No friction rub. No gallop.  Pulmonary:     Effort: Pulmonary effort is normal. No respiratory distress.     Breath sounds: Normal breath sounds.  Abdominal:     General: Bowel sounds are normal. There is no distension.     Palpations: Abdomen is soft. There is no mass.     Tenderness: There is abdominal tenderness. There is no guarding.     Comments: Mid to upper abd tenderness.   Genitourinary:    Comments: No cva tenderness.  Musculoskeletal:        General: No swelling or tenderness.     Cervical back: Normal range of motion and neck supple. No rigidity. No muscular tenderness.  Skin:    General: Skin is warm and dry.     Findings: No rash.  Neurological:     Mental Status: She is alert.     Comments: Alert, speech normal. Motor/sens grossly intact  bil.   Psychiatric:        Mood and Affect: Mood normal.     ED Results / Procedures / Treatments   Labs (all labs ordered are listed, but only abnormal results are displayed) Results for orders placed or performed during the hospital encounter of 05/18/22  Lipase, blood  Result Value Ref Range   Lipase 30 11 - 51 U/L  CBC  Result Value Ref Range   WBC 10.6 (H) 4.0 - 10.5 K/uL   RBC 3.99 3.87 - 5.11 MIL/uL   Hemoglobin 12.1 12.0 - 15.0 g/dL   HCT 35.3 (L) 36.0 - 46.0 %   MCV 88.5 80.0 - 100.0 fL  MCH 30.3 26.0 - 34.0 pg   MCHC 34.3 30.0 - 36.0 g/dL   RDW 13.8 11.5 - 15.5 %   Platelets 275 150 - 400 K/uL   nRBC 0.0 0.0 - 0.2 %  Comprehensive metabolic panel  Result Value Ref Range   Sodium 137 135 - 145 mmol/L   Potassium 3.8 3.5 - 5.1 mmol/L   Chloride 102 98 - 111 mmol/L   CO2 26 22 - 32 mmol/L   Glucose, Bld 92 70 - 99 mg/dL   BUN 18 6 - 20 mg/dL   Creatinine, Ser 1.26 (H) 0.44 - 1.00 mg/dL   Calcium 9.2 8.9 - 10.3 mg/dL   Total Protein 7.1 6.5 - 8.1 g/dL   Albumin 3.3 (L) 3.5 - 5.0 g/dL   AST 16 15 - 41 U/L   ALT 13 0 - 44 U/L   Alkaline Phosphatase 54 38 - 126 U/L   Total Bilirubin 1.1 0.3 - 1.2 mg/dL   GFR, Estimated 52 (L) >60 mL/min   Anion gap 9 5 - 15      EKG None  Radiology CT Abdomen Pelvis W Contrast  Result Date: 05/18/2022 CLINICAL DATA:  Abdominal pain EXAM: CT ABDOMEN AND PELVIS WITH CONTRAST TECHNIQUE: Multidetector CT imaging of the abdomen and pelvis was performed using the standard protocol following bolus administration of intravenous contrast. RADIATION DOSE REDUCTION: This exam was performed according to the departmental dose-optimization program which includes automated exposure control, adjustment of the mA and/or kV according to patient size and/or use of iterative reconstruction technique. CONTRAST:  33m OMNIPAQUE IOHEXOL 350 MG/ML SOLN COMPARISON:  08/02/2020 FINDINGS: Lower chest: No acute findings are seen. Minimal scarring is  seen in the posterior left lower lobe. Hepatobiliary: No focal abnormalities are seen in liver. There is no dilation of bile ducts. Gallbladder is unremarkable. Pancreas: No focal abnormalities are seen. Spleen: Unremarkable. Adrenals/Urinary Tract: Adrenals are not enlarged. There is no hydronephrosis. There are no renal or ureteral stones. Urinary bladder is unremarkable. Stomach/Bowel: Stomach is unremarkable. Small bowel loops are not dilated. Appendix is not dilated. There is fat attenuation around the ileocecal junction, most likely a normal variation. There is no wall thickening in colon. There is no pericolic stranding. Vascular/Lymphatic: Unremarkable. There are few subcentimeter nodes with no significant interval change. Reproductive: Uterus is not seen.  There are no adnexal masses. Other: There is no ascites or pneumoperitoneum. Umbilical hernia containing fat is seen. Musculoskeletal: Degenerative changes are noted in lumbar spine, more so at the L4-L5 and L5-S1 levels with encroachment of neural foramina. There is spinal stenosis at L4-L5 level. IMPRESSION: There is no evidence of intestinal obstruction or pneumoperitoneum. Appendix is not dilated. There is no hydronephrosis. Lumbar spondylosis. Electronically Signed   By: PElmer PickerM.D.   On: 05/18/2022 18:27    Procedures Procedures    Medications Ordered in ED Medications  sodium chloride 0.9 % bolus 1,000 mL (has no administration in time range)  ondansetron (ZOFRAN) injection 4 mg (has no administration in time range)  HYDROmorphone (DILAUDID) injection 0.5 mg (has no administration in time range)    ED Course/ Medical Decision Making/ A&P                           Medical Decision Making Problems Addressed: Dehydration: acute illness or injury with systemic symptoms that poses a threat to life or bodily functions Epigastric pain: acute illness or injury Essential hypertension: chronic illness or  injury with  exacerbation, progression, or side effects of treatment that poses a threat to life or bodily functions Nausea and vomiting in adult: acute illness or injury with systemic symptoms that poses a threat to life or bodily functions  Amount and/or Complexity of Data Reviewed Independent Historian: EMS    Details: hx External Data Reviewed: notes. Labs: ordered. Decision-making details documented in ED Course. Radiology: ordered and independent interpretation performed. Decision-making details documented in ED Course.  Risk Prescription drug management. Parenteral controlled substances. Decision regarding hospitalization.   Iv ns. Continuous pulse ox and cardiac monitoring. Labs ordered/sent. Imaging ordered.   Diff dx includes gastroenteritis, pancreatitis, gastritis, food poisoning, sbo, etc - dispo decision including potential need for admission considered - will get labs and imaging and reassess.  Reviewed nursing notes and prior charts for additional history. External reports reviewed. Additional history from: EMS.  Dilaudid iv. Zofran iv. Ivf bolus.   Cardiac monitor: sinus rhythm, rate 84.  Labs reviewed/interpreted by me - wbc mildly high.   Persistent tenderness. Will get ct.   Ct reviewed/interpreted by me - no sbo/infection.  Trial po.   Pt tolerating po. No recurrent emesis.   Pt appears stable for d/c.  Return precautions provided.              Final Clinical Impression(s) / ED Diagnoses Final diagnoses:  None    Rx / DC Orders ED Discharge Orders     None          Lajean Saver, MD 05/18/22 1919

## 2022-05-18 NOTE — Discharge Instructions (Addendum)
It was our pleasure to provide your ER care today - we hope that you feel better.  Drink plenty of fluids/stay well hydrated. Take zofran as need for nausea.   Follow up with primary care doctor in 2-3 days if symptoms fail to improve/resolve. Also follow up with your doctor regarding your high blood pressure.  Return to ER if worse, new symptoms, fevers, persistent vomiting, severe pain, or other concern.  You were given pain meds in the ER - no driving for the next 6 hours.

## 2022-05-23 DIAGNOSIS — R69 Illness, unspecified: Secondary | ICD-10-CM | POA: Diagnosis not present

## 2022-05-23 DIAGNOSIS — F411 Generalized anxiety disorder: Secondary | ICD-10-CM | POA: Diagnosis not present

## 2022-06-11 DIAGNOSIS — K219 Gastro-esophageal reflux disease without esophagitis: Secondary | ICD-10-CM | POA: Diagnosis not present

## 2022-06-11 DIAGNOSIS — Z0000 Encounter for general adult medical examination without abnormal findings: Secondary | ICD-10-CM | POA: Diagnosis not present

## 2022-06-11 DIAGNOSIS — E782 Mixed hyperlipidemia: Secondary | ICD-10-CM | POA: Diagnosis not present

## 2022-06-11 DIAGNOSIS — D519 Vitamin B12 deficiency anemia, unspecified: Secondary | ICD-10-CM | POA: Diagnosis not present

## 2022-06-11 DIAGNOSIS — N760 Acute vaginitis: Secondary | ICD-10-CM | POA: Diagnosis not present

## 2022-06-11 DIAGNOSIS — R7303 Prediabetes: Secondary | ICD-10-CM | POA: Diagnosis not present

## 2022-06-11 DIAGNOSIS — E039 Hypothyroidism, unspecified: Secondary | ICD-10-CM | POA: Diagnosis not present

## 2022-06-11 DIAGNOSIS — Z112 Encounter for screening for other bacterial diseases: Secondary | ICD-10-CM | POA: Diagnosis not present

## 2022-06-11 DIAGNOSIS — Z01419 Encounter for gynecological examination (general) (routine) without abnormal findings: Secondary | ICD-10-CM | POA: Diagnosis not present

## 2022-06-11 DIAGNOSIS — I10 Essential (primary) hypertension: Secondary | ICD-10-CM | POA: Diagnosis not present

## 2022-06-11 DIAGNOSIS — Z113 Encounter for screening for infections with a predominantly sexual mode of transmission: Secondary | ICD-10-CM | POA: Diagnosis not present

## 2022-06-11 DIAGNOSIS — R69 Illness, unspecified: Secondary | ICD-10-CM | POA: Diagnosis not present

## 2022-06-11 DIAGNOSIS — Z1151 Encounter for screening for human papillomavirus (HPV): Secondary | ICD-10-CM | POA: Diagnosis not present

## 2022-06-11 DIAGNOSIS — E559 Vitamin D deficiency, unspecified: Secondary | ICD-10-CM | POA: Diagnosis not present

## 2022-06-13 DIAGNOSIS — F411 Generalized anxiety disorder: Secondary | ICD-10-CM | POA: Diagnosis not present

## 2022-06-13 DIAGNOSIS — R69 Illness, unspecified: Secondary | ICD-10-CM | POA: Diagnosis not present

## 2022-06-14 DIAGNOSIS — R69 Illness, unspecified: Secondary | ICD-10-CM | POA: Diagnosis not present

## 2022-06-14 DIAGNOSIS — F332 Major depressive disorder, recurrent severe without psychotic features: Secondary | ICD-10-CM | POA: Diagnosis not present

## 2022-06-18 ENCOUNTER — Telehealth: Payer: Self-pay

## 2022-06-18 NOTE — Telephone Encounter (Signed)
-----  Message from Shelby Mattocks, [place] sent at 05/07/2022  9:19 AM EST ----- Remind H pylori test

## 2022-06-18 NOTE — Telephone Encounter (Signed)
Patient states she will come to our office to have the lab work done. She states she will come this week

## 2022-06-18 NOTE — Telephone Encounter (Signed)
Called and left a message for call back  

## 2022-06-20 DIAGNOSIS — A048 Other specified bacterial intestinal infections: Secondary | ICD-10-CM | POA: Diagnosis not present

## 2022-06-21 LAB — H. PYLORI BREATH TEST: H pylori Breath Test: NEGATIVE

## 2022-06-24 ENCOUNTER — Telehealth: Payer: Self-pay

## 2022-06-24 NOTE — Telephone Encounter (Signed)
Called patient and patient verbalized understanding of results  

## 2022-06-24 NOTE — Telephone Encounter (Signed)
-----   Message from Lin Landsman, MD sent at 06/24/2022 11:02 AM EST ----- H. pylori breath test came back negative which means that she no longer has active Helicobacter pylori infection  Rohini Vanga

## 2022-07-05 ENCOUNTER — Other Ambulatory Visit: Payer: Self-pay | Admitting: Family

## 2022-07-05 DIAGNOSIS — R7303 Prediabetes: Secondary | ICD-10-CM

## 2022-07-15 DIAGNOSIS — R69 Illness, unspecified: Secondary | ICD-10-CM | POA: Diagnosis not present

## 2022-07-15 DIAGNOSIS — F332 Major depressive disorder, recurrent severe without psychotic features: Secondary | ICD-10-CM | POA: Diagnosis not present

## 2022-07-18 DIAGNOSIS — R69 Illness, unspecified: Secondary | ICD-10-CM | POA: Diagnosis not present

## 2022-07-18 DIAGNOSIS — F411 Generalized anxiety disorder: Secondary | ICD-10-CM | POA: Diagnosis not present

## 2022-07-22 ENCOUNTER — Ambulatory Visit: Payer: Medicare HMO | Admitting: Certified Registered"

## 2022-07-22 ENCOUNTER — Ambulatory Visit
Admission: RE | Admit: 2022-07-22 | Discharge: 2022-07-22 | Disposition: A | Discharge status: Home / Self Care | Payer: Medicare HMO | Attending: Gastroenterology | Admitting: Gastroenterology

## 2022-07-22 ENCOUNTER — Encounter
Admission: RE | Disposition: A | Discharge status: Home / Self Care | Payer: Self-pay | Source: Home / Self Care | Attending: Gastroenterology

## 2022-07-22 ENCOUNTER — Encounter: Payer: Self-pay | Admitting: Gastroenterology

## 2022-07-22 DIAGNOSIS — E119 Type 2 diabetes mellitus without complications: Secondary | ICD-10-CM | POA: Diagnosis not present

## 2022-07-22 DIAGNOSIS — E210 Primary hyperparathyroidism: Secondary | ICD-10-CM | POA: Insufficient documentation

## 2022-07-22 DIAGNOSIS — D122 Benign neoplasm of ascending colon: Secondary | ICD-10-CM | POA: Diagnosis not present

## 2022-07-22 DIAGNOSIS — Z96651 Presence of right artificial knee joint: Secondary | ICD-10-CM | POA: Diagnosis not present

## 2022-07-22 DIAGNOSIS — K635 Polyp of colon: Secondary | ICD-10-CM

## 2022-07-22 DIAGNOSIS — G4730 Sleep apnea, unspecified: Secondary | ICD-10-CM | POA: Diagnosis not present

## 2022-07-22 DIAGNOSIS — I10 Essential (primary) hypertension: Secondary | ICD-10-CM | POA: Diagnosis not present

## 2022-07-22 DIAGNOSIS — Z860100 Personal history of colon polyps, unspecified: Secondary | ICD-10-CM

## 2022-07-22 DIAGNOSIS — J45909 Unspecified asthma, uncomplicated: Secondary | ICD-10-CM | POA: Insufficient documentation

## 2022-07-22 DIAGNOSIS — E785 Hyperlipidemia, unspecified: Secondary | ICD-10-CM | POA: Diagnosis not present

## 2022-07-22 DIAGNOSIS — Z8711 Personal history of peptic ulcer disease: Secondary | ICD-10-CM | POA: Insufficient documentation

## 2022-07-22 DIAGNOSIS — Z1211 Encounter for screening for malignant neoplasm of colon: Secondary | ICD-10-CM | POA: Diagnosis not present

## 2022-07-22 DIAGNOSIS — Z90710 Acquired absence of both cervix and uterus: Secondary | ICD-10-CM | POA: Insufficient documentation

## 2022-07-22 DIAGNOSIS — D126 Benign neoplasm of colon, unspecified: Secondary | ICD-10-CM | POA: Diagnosis not present

## 2022-07-22 HISTORY — PX: COLONOSCOPY WITH PROPOFOL: SHX5780

## 2022-07-22 LAB — GLUCOSE, CAPILLARY: Glucose-Capillary: 99 mg/dL (ref 70–99)

## 2022-07-22 SURGERY — COLONOSCOPY WITH PROPOFOL
Anesthesia: General

## 2022-07-22 MED ORDER — LIDOCAINE HCL (CARDIAC) PF 100 MG/5ML IV SOSY
PREFILLED_SYRINGE | INTRAVENOUS | Status: DC | PRN
  Administered 2022-07-22: 50 mg via INTRAVENOUS

## 2022-07-22 MED ORDER — SODIUM CHLORIDE 0.9 % IV SOLN: INTRAVENOUS | Status: DC

## 2022-07-22 MED ORDER — PROPOFOL 1000 MG/100ML IV EMUL
INTRAVENOUS | Status: AC
  Filled 2022-07-22: qty 100

## 2022-07-22 MED ORDER — PROPOFOL 10 MG/ML IV BOLUS
INTRAVENOUS | Status: DC | PRN
  Administered 2022-07-22: 10 mg via INTRAVENOUS
  Administered 2022-07-22: 20 mg via INTRAVENOUS
  Administered 2022-07-22: 70 mg via INTRAVENOUS

## 2022-07-22 MED ORDER — PROPOFOL 500 MG/50ML IV EMUL
INTRAVENOUS | Status: DC | PRN
  Administered 2022-07-22: 150 ug/kg/min via INTRAVENOUS

## 2022-07-22 NOTE — Transfer of Care (Signed)
Immediate Anesthesia Transfer of Care Note  Patient: Candice Watkins  Procedure(s) Performed: COLONOSCOPY WITH PROPOFOL  Patient Location: PACU and Endoscopy Unit  Anesthesia Type:General  Level of Consciousness: awake  Airway & Oxygen Therapy: Patient Spontanous Breathing  Post-op Assessment: Report given to RN and Post -op Vital signs reviewed and stable  Post vital signs: Reviewed and stable  Last Vitals:  Vitals Value Taken Time  BP 115/73 07/22/22 0804  Temp 35.8 C 07/22/22 0804  Pulse 102 07/22/22 0806  Resp 14 07/22/22 0806  SpO2 100 % 07/22/22 0806  Vitals shown include unvalidated device data.  Last Pain:  Vitals:   07/22/22 0804  TempSrc: Temporal  PainSc: 0-No pain         Complications: No notable events documented.

## 2022-07-22 NOTE — Anesthesia Postprocedure Evaluation (Signed)
Anesthesia Post Note  Patient: Candice Watkins  Procedure(s) Performed: COLONOSCOPY WITH PROPOFOL  Patient location during evaluation: PACU Anesthesia Type: General Level of consciousness: awake Pain management: pain level controlled Vital Signs Assessment: post-procedure vital signs reviewed and stable Respiratory status: nonlabored ventilation and respiratory function stable Cardiovascular status: stable Anesthetic complications: no   No notable events documented.   Last Vitals:  Vitals:   07/22/22 0814 07/22/22 0824  BP: (!) 109/55 93/80  Pulse:  88  Resp:    Temp:    SpO2: 100% 100%    Last Pain:  Vitals:   07/22/22 0824  TempSrc:   PainSc: 0-No pain                 VAN STAVEREN,Avrie Kedzierski

## 2022-07-22 NOTE — Anesthesia Procedure Notes (Signed)
Procedure Name: MAC Date/Time: 07/22/2022 7:46 AM  Performed by: Biagio Borg, CRNAPre-anesthesia Checklist: Patient identified, Emergency Drugs available, Suction available, Patient being monitored and Timeout performed Patient Re-evaluated:Patient Re-evaluated prior to induction Oxygen Delivery Method: Nasal cannula Induction Type: IV induction Placement Confirmation: positive ETCO2 and CO2 detector

## 2022-07-22 NOTE — Anesthesia Preprocedure Evaluation (Signed)
Anesthesia Evaluation  Patient identified by MRN, date of birth, ID band Patient awake    Reviewed: Allergy & Precautions, NPO status , Patient's Chart, lab work & pertinent test results  History of Anesthesia Complications (+) PONV and history of anesthetic complications  Airway Mallampati: II  TM Distance: >3 FB Neck ROM: Full    Dental  (+) Teeth Intact   Pulmonary neg pulmonary ROS, asthma , sleep apnea and Continuous Positive Airway Pressure Ventilation    Pulmonary exam normal breath sounds clear to auscultation       Cardiovascular Exercise Tolerance: Good hypertension, Pt. on medications negative cardio ROS Normal cardiovascular exam Rhythm:Regular     Neuro/Psych  Headaches  Anxiety     negative neurological ROS  negative psych ROS   GI/Hepatic negative GI ROS, Neg liver ROS, PUD,,,  Endo/Other  negative endocrine ROSdiabetes, Type 2Hypothyroidism    Renal/GU negative Renal ROS  negative genitourinary   Musculoskeletal  (+) Arthritis ,    Abdominal  (+) + obese  Peds negative pediatric ROS (+)  Hematology negative hematology ROS (+)   Anesthesia Other Findings Past Medical History: No date: Anxiety No date: Arthritis     Comment:  "knees, ankles" (10/11/2015) No date: Asthma No date: Depression No date: Diabetes mellitus without complication (HCC) No date: Heartburn No date: HLD (hyperlipidemia) No date: Hypertension No date: Hypothyroidism No date: Kidney stones No date: Migraine     Comment:  "monthly" (10/11/2015) X 1: PONV (postoperative nausea and vomiting) No date: Primary hyperparathyroidism ([place]) No date: Seasonal allergies No date: Sleep apnea No date: Stomach ulcer  Past Surgical History: No date: BREAST BIOPSY; Bilateral     Comment:  neg- core 2000: CESAREAN SECTION 05/02/2022: COLONOSCOPY WITH PROPOFOL; N/A     Comment:  Procedure: COLONOSCOPY WITH PROPOFOL;  Surgeon: Lin Landsman, MD;  Location: [facility] ENDOSCOPY;  Service:               Gastroenterology;  Laterality: N/A; 05/02/2022: ESOPHAGOGASTRODUODENOSCOPY (EGD) WITH PROPOFOL; N/A     Comment:  Procedure: ESOPHAGOGASTRODUODENOSCOPY (EGD) WITH               PROPOFOL;  Surgeon: Lin Landsman, MD;  Location:               [facility] ENDOSCOPY;  Service: Gastroenterology;  Laterality:               N/A; No date: JOINT REPLACEMENT No date: KNEE ARTHROSCOPY; Right No date: THYROID SURGERY     Comment:  "removed tumor" 10/11/2015: TOTAL KNEE ARTHROPLASTY; Right 10/11/2015: TOTAL KNEE ARTHROPLASTY; Right     Comment:  Procedure: TOTAL KNEE ARTHROPLASTY;  Surgeon: Ninetta Lights, MD;  Location: [place];  Service: Orthopedics;                Laterality: Right; No date: TUBAL LIGATION No date: VAGINAL HYSTERECTOMY     Reproductive/Obstetrics negative OB ROS                             Anesthesia Physical Anesthesia Plan  ASA: 3  Anesthesia Plan: General   Post-op Pain Management:    Induction: Intravenous  PONV Risk Score and Plan: Propofol infusion and TIVA  Airway Management Planned: Natural Airway  Additional Equipment:  Intra-op Plan:   Post-operative Plan:   Informed Consent: I have reviewed the patients History and Physical, chart, labs and discussed the procedure including the risks, benefits and alternatives for the proposed anesthesia with the patient or authorized representative who has indicated his/her understanding and acceptance.     Dental Advisory Given  Plan Discussed with: CRNA and Surgeon  Anesthesia Plan Comments:        Anesthesia Quick Evaluation

## 2022-07-22 NOTE — Op Note (Signed)
[facility] Gastroenterology Patient Name: Candice Watkins Procedure Date: 07/22/2022 7:11 AM MRN: [id number] Account #: [id number] Date of Birth: [birth year] Admit Type: Outpatient Age: 52 Room: [facility] ENDO ROOM 4 Gender: Female Note Status: Finalized Instrument Name: Park Meo [id number] Procedure:             Colonoscopy Indications:           Surveillance: History of adenomatous polyps,                         inadequate prep on last exam (<24yr, Last colonoscopy:                         December 2023 Providers:             RLin LandsmanMD, MD Referring MD:          No Local Md, MD (Referring MD) Medicines:             General Anesthesia Complications:         No immediate complications. Estimated blood loss: None. Procedure:             Pre-Anesthesia Assessment:                        - Prior to the procedure, a History and Physical was                         performed, and patient medications and allergies were                         reviewed. The patient is competent. The risks and                         benefits of the procedure and the sedation options and                         risks were discussed with the patient. All questions                         were answered and informed consent was obtained.                         Patient identification and proposed procedure were                         verified by the physician, the nurse, the                         anesthesiologist, the anesthetist and the technician                         in the pre-procedure area in the procedure room in the                         endoscopy suite. Mental Status Examination: alert and                         oriented. Airway Examination: normal oropharyngeal  airway and neck mobility. Respiratory Examination:                         clear to auscultation. CV Examination: normal.                         Prophylactic Antibiotics: The patient  does not require                         prophylactic antibiotics. Prior Anticoagulants: The                         patient has taken no anticoagulant or antiplatelet                         agents. ASA Grade Assessment: III - A patient with                         severe systemic disease. After reviewing the risks and                         benefits, the patient was deemed in satisfactory                         condition to undergo the procedure. The anesthesia                         plan was to use general anesthesia. Immediately prior                         to administration of medications, the patient was                         re-assessed for adequacy to receive sedatives. The                         heart rate, respiratory rate, oxygen saturations,                         blood pressure, adequacy of pulmonary ventilation, and                         response to care were monitored throughout the                         procedure. The physical status of the patient was                         re-assessed after the procedure.                        After obtaining informed consent, the colonoscope was                         passed under direct vision. Throughout the procedure,                         the patient's blood pressure, pulse, and oxygen  saturations were monitored continuously. The                         Colonoscope was introduced through the anus and                         advanced to the the cecum, identified by appendiceal                         orifice and ileocecal valve. The colonoscopy was                         performed without difficulty. The patient tolerated                         the procedure well. The quality of the bowel                         preparation was evaluated using the BBPS [facility] Bowel                         Preparation Scale) with scores of: Right Colon = 3,                         Transverse Colon = 3 and Left  Colon = 3 (entire mucosa                         seen well with no residual staining, small fragments                         of stool or opaque liquid). The total BBPS score                         equals 9. The ileocecal valve, appendiceal orifice,                         and rectum were photographed. Findings:      The perianal and digital rectal examinations were normal. Pertinent       negatives include normal sphincter tone and no palpable rectal lesions.      A 3 mm polyp was found in the ascending colon. The polyp was sessile.       The polyp was removed with a cold snare. Resection and retrieval were       complete. Estimated blood loss: none.      The exam was otherwise without abnormality.      The retroflexed view of the distal rectum and anal verge was normal and       showed no anal or rectal abnormalities. Impression:            - One 3 mm polyp in the ascending colon, removed with                         a cold snare. Resected and retrieved.                        - The examination was otherwise normal.                        -  The distal rectum and anal verge are normal on                         retroflexion view. Recommendation:        - Discharge patient to home (with escort).                        - Resume previous diet today.                        - Continue present medications.                        - Await pathology results.                        - Repeat colonoscopy in 7 years for surveillance with                         2 day prep. Procedure Code(s):     --- Professional ---                        [phone], Colonoscopy, flexible; with removal of                         tumor(s), polyp(s), or other lesion(s) by snare                         technique Diagnosis Code(s):     --- Professional ---                        D12.2, Benign neoplasm of ascending colon                        Z86.010, Personal history of colonic polyps CPT copyright 2022 American Medical  Association. All rights reserved. The codes documented in this report are preliminary and upon coder review may  be revised to meet current compliance requirements. Dr. Ulyess Mort Lin Landsman MD, MD 07/22/2022 8:04:00 AM This report has been signed electronically. Number of Addenda: 0 Note Initiated On: 07/22/2022 7:11 AM Scope Withdrawal Time: 0 hours 10 minutes 34 seconds  Total Procedure Duration: 0 hours 13 minutes 0 seconds  Estimated Blood Loss:  Estimated blood loss: none.      [facility]

## 2022-07-22 NOTE — H&P (Addendum)
Cephas Darby, MD [address]  [place]  [place], [place] [address]  Main: [phone]  Fax: [phone] Pager: [phone]  Primary Care Physician:  Mechele Claude, FNP Primary Gastroenterologist:  Dr. Cephas Darby  Pre-Procedure History & Physical: HPI:  Candice Watkins is a 52 y.o. female is here for an colonoscopy.   Past Medical History:  Diagnosis Date   Anxiety    Arthritis    "knees, ankles" (10/11/2015)   Asthma    Depression    Diabetes mellitus without complication ([place])    Heartburn    HLD (hyperlipidemia)    Hypertension    Hypothyroidism    Kidney stones    Migraine    "monthly" (10/11/2015)   PONV (postoperative nausea and vomiting) X 1   Primary hyperparathyroidism ([place])    Seasonal allergies    Sleep apnea    Stomach ulcer     Past Surgical History:  Procedure Laterality Date   BREAST BIOPSY Bilateral    neg- core   CESAREAN SECTION  2000   COLONOSCOPY WITH PROPOFOL N/A 05/02/2022   Procedure: COLONOSCOPY WITH PROPOFOL;  Surgeon: Lin Landsman, MD;  Location: [place];  Service: Gastroenterology;  Laterality: N/A;   ESOPHAGOGASTRODUODENOSCOPY (EGD) WITH PROPOFOL N/A 05/02/2022   Procedure: ESOPHAGOGASTRODUODENOSCOPY (EGD) WITH PROPOFOL;  Surgeon: Lin Landsman, MD;  Location: [facility] ENDOSCOPY;  Service: Gastroenterology;  Laterality: N/A;   JOINT REPLACEMENT     KNEE ARTHROSCOPY Right    THYROID SURGERY     "removed tumor"   TOTAL KNEE ARTHROPLASTY Right 10/11/2015   TOTAL KNEE ARTHROPLASTY Right 10/11/2015   Procedure: TOTAL KNEE ARTHROPLASTY;  Surgeon: Ninetta Lights, MD;  Location: [place];  Service: Orthopedics;  Laterality: Right;   TUBAL LIGATION     VAGINAL HYSTERECTOMY      Prior to Admission medications   Medication Sig Start Date End Date Taking? Authorizing Provider  amLODipine (NORVASC) 10 MG tablet Take 1 tablet (10 mg total) by mouth daily. For high blood pressure 02/01/17  Yes Money, Lowry Ram,  FNP  budesonide-formoterol (SYMBICORT) 80-4.5 MCG/ACT inhaler Inhale 2 puffs into the lungs 2 (two) times daily.   Yes [provider]  enalapril-hydrochlorothiazide (VASERETIC) 10-25 MG tablet Take 0.5 tablets by mouth daily.   Yes [provider]  levothyroxine (SYNTHROID) 112 MCG tablet Take 112 mcg by mouth every morning. 04/12/22  Yes [provider]  lisinopril-hydrochlorothiazide (ZESTORETIC) 10-12.5 MG tablet Take 1 tablet by mouth daily. 08/24/13  Yes [provider]  methocarbamol (ROBAXIN) 500 MG tablet Take 1 tablet (500 mg total) by mouth 2 (two) times daily. 08/02/20  Yes Muthersbaugh, Jarrett Soho, PA-C  metoprolol succinate (TOPROL-XL) 50 MG 24 hr tablet Take 50 mg by mouth daily. 01/15/22  Yes [provider]  valsartan-hydrochlorothiazide (DIOVAN-HCT) 160-25 MG tablet Take by mouth daily. 12/28/21  Yes [provider]  Azelastine HCl 137 MCG/SPRAY SOLN  03/03/22   [provider]  cetirizine (ZYRTEC) 10 MG chewable tablet Chew 10 mg by mouth at bedtime.    [provider]  ergocalciferol (VITAMIN D2) 1.25 MG (50000 UT) capsule Take 50,000 Units by mouth once a week.    [provider]  fexofenadine (ALLEGRA) 180 MG tablet Take 180 mg by mouth daily. 10/31/09   [provider]  hydrOXYzine (ATARAX/VISTARIL) 25 MG tablet Take 1 tablet (25 mg total) by mouth 3 (three) times daily as needed for anxiety. 01/31/17   Money, Lowry Ram, FNP  ibuprofen (  ADVIL) 800 MG tablet Take 800 mg by mouth every 8 (eight) hours as needed.    [provider]  omeprazole (PRILOSEC) 20 MG capsule  03/04/22   [provider]  omeprazole (PRILOSEC) 20 MG capsule Take 1 capsule (20 mg total) by mouth 2 (two) times daily before a meal for 14 days. 05/07/22 05/21/22  Lin Landsman, MD  ondansetron (ZOFRAN-ODT) 8 MG disintegrating tablet Take 1 tablet (8 mg total) by mouth every 8 (eight) hours as needed for nausea  or vomiting. 05/18/22   Lajean Saver, MD  rosuvastatin (CRESTOR) 10 MG tablet Take 10 mg by mouth daily. Patient not taking: Reported on 07/22/2022 01/15/22   [provider]  tirzepatide Darcel Bayley) 5 MG/0.5ML Pen Inject 5 mg into the skin once a week. 07/09/22   Mechele Claude, FNP  TRINTELLIX 20 MG TABS tablet Take 20 mg by mouth daily. 11/30/21   [provider]  TRULICITY 4.5 0000000 SOPN  03/19/22   [provider]    Allergies as of 05/07/2022 - Review Complete 05/02/2022  Allergen Reaction Noted   Ibuprofen Other (See Comments) 09/27/2015   Metronidazole Swelling and Other (See Comments) 09/27/2015   Phenyltoloxamine-acetaminophen Hives 10/10/2015   Other Nausea And Vomiting 09/27/2015   Septra [sulfamethoxazole-trimethoprim] Nausea And Vomiting 12/15/2014    Family History  Problem Relation Age of Onset   Breast cancer Neg Hx    Kidney disease Neg Hx     Social History   Socioeconomic History   Marital status: Single    Spouse name: Not on file   Number of children: Not on file   Years of education: Not on file   Highest education level: Not on file  Occupational History   Not on file  Tobacco Use   Smoking status: Never   Smokeless tobacco: Never  Vaping Use   Vaping Use: Never used  Substance and Sexual Activity   Alcohol use: Yes    Alcohol/week: 2.0 standard drinks of alcohol    Types: 1 Glasses of wine, 1 Shots of liquor per week   Drug use: No   Sexual activity: Yes    Birth control/protection: None    Comment: hysterectomy  Other Topics Concern   Not on file  Social History Narrative   Not on file   Social Determinants of Health   Financial Resource Strain: Not on file  Food Insecurity: Not on file  Transportation Needs: Not on file  Physical Activity: Not on file  Stress: Not on file  Social Connections: Not on file  Intimate Partner Violence: Not on file    Review of Systems: See HPI, otherwise negative  ROS  Physical Exam: BP 126/81   Pulse (!) 106   Temp (!) 96.2 F (35.7 C) (Temporal)   Resp 16   Wt 120.4 kg   SpO2 98%   BMI 45.56 kg/m  General:   Alert,  pleasant and cooperative in NAD Head:  Normocephalic and atraumatic. Neck:  Supple; no masses or thyromegaly. Lungs:  Clear throughout to auscultation.    Heart:  Regular rate and rhythm. Abdomen:  Soft, nontender and nondistended. Normal bowel sounds, without guarding, and without rebound.   Neurologic:  Alert and  oriented x4;  grossly normal neurologically.  Impression/Plan: Candice Watkins is here for an colonoscopy to be performed for h/o colon adenomas, previous colonoscopy inadequate prep  Risks, benefits, limitations, and alternatives regarding  colonoscopy have been reviewed with the patient.  Questions have  been answered.  All parties agreeable.   Sherri Sear, MD  07/22/2022, 7:34 AM

## 2022-07-23 ENCOUNTER — Encounter: Payer: Self-pay | Admitting: Gastroenterology

## 2022-07-23 LAB — SURGICAL PATHOLOGY

## 2022-07-24 DIAGNOSIS — M1712 Unilateral primary osteoarthritis, left knee: Secondary | ICD-10-CM | POA: Diagnosis not present

## 2022-08-06 ENCOUNTER — Other Ambulatory Visit: Payer: Self-pay

## 2022-08-08 ENCOUNTER — Ambulatory Visit (INDEPENDENT_AMBULATORY_CARE_PROVIDER_SITE_OTHER): Payer: Medicare HMO | Admitting: Gastroenterology

## 2022-08-08 ENCOUNTER — Encounter: Payer: Self-pay | Admitting: Gastroenterology

## 2022-08-08 VITALS — BP 118/72 | HR 82 | Temp 97.9°F | Ht 63.0 in | Wt 271.5 lb

## 2022-08-08 DIAGNOSIS — K921 Melena: Secondary | ICD-10-CM | POA: Diagnosis not present

## 2022-08-08 DIAGNOSIS — R112 Nausea with vomiting, unspecified: Secondary | ICD-10-CM

## 2022-08-08 MED ORDER — OMEPRAZOLE 40 MG PO CPDR: 40.0000 mg | DELAYED_RELEASE_CAPSULE | Freq: Every day | ORAL | 0 refills | Status: DC

## 2022-08-08 NOTE — Progress Notes (Signed)
Cephas Darby, MD [address]  [place]  [place], [place] [address]  Main: [phone]  Fax: [phone]    Gastroenterology Consultation  Referring Provider:     Mechele Claude, FNP Primary Care Physician:  Mechele Claude, FNP Primary Gastroenterologist:  Dr. Cephas Darby Reason for Consultation: Nausea and vomiting, black stools        HPI:   Candice Watkins is a 52 y.o. female referred by Mechele Claude, FNP  for consultation & management of nausea and vomiting.  Patient with history of metabolic syndrome, has been experiencing symptoms of nausea and vomiting of gastric contents for about 2 months.  These episodes are sporadic, not particularly after a meal.  Denies any nocturnal symptoms.  Denies any heartburn.  She is started on omeprazole 20 mg daily in the morning which has significantly helped with her symptoms.  She was on Ozempic for 1 year, switched to Trulicity because of unavailability.  She denies any other GI symptoms.  Review of labs revealed chronic microcytic anemia, most recent hemoglobin 11.3, MCV 79.5.  Unknown iron and B12 levels.  She denies any menorrhagia, rectal bleeding, hematuria  Follow-up visit 08/08/2022 Patient is here to discuss about recurrence of nausea and vomiting that started about 9 days ago.  She is also noticing that her stools are black and tarry.  She takes Aleve about once a week for arthritis.  She denies intake of any other NSAIDs.  She is not taking omeprazole.  She underwent EGD in 04/2022, biopsies came up positive for H. pylori, s/p triple therapy.  Confirmed eradication by H. pylori breath test in 06/2022.  Patient does not smoke or drink alcohol  NSAIDs: None  Antiplts/Anticoagulants/Anti thrombotics: None  GI Procedures:   EGD and colonoscopy 05/02/2022 DIAGNOSIS:  A. STOMACH, RANDOM; COLD BIOPSY:  - PATCHY MODERATE CHRONIC ACTIVE GASTRITIS WITH RARE HELICOBACTER  ORGANISMS IDENTIFIED ON IHC STAIN.  -  NEGATIVE FOR INTESTINAL METAPLASIA, DYSPLASIA, AND MALIGNANCY.   B.  COLON POLYP, TRANSVERSE; COLD SNARE:  - TUBULAR ADENOMA.  - NEGATIVE FOR HIGH-GRADE DYSPLASIA AND MALIGNANCY.   C.  COLON POLYP, SIGMOID; COLD SNARE:  - TUBULAR ADENOMA.  - NEGATIVE FOR HIGH-GRADE DYSPLASIA AND MALIGNANCY.    She denies any family history of GI malignancy  Past Medical History:  Diagnosis Date   Anxiety    Arthritis    "knees, ankles" (10/11/2015)   Asthma    Depression    Diabetes mellitus without complication ([facility])    Heartburn    HLD (hyperlipidemia)    Hypertension    Hypothyroidism    Kidney stones    Migraine    "monthly" (10/11/2015)   PONV (postoperative nausea and vomiting) X 1   Primary hyperparathyroidism ([facility])    Seasonal allergies    Sleep apnea    Stomach ulcer     Past Surgical History:  Procedure Laterality Date   BREAST BIOPSY Bilateral    neg- core   CESAREAN SECTION  2000   COLONOSCOPY WITH PROPOFOL N/A 05/02/2022   Procedure: COLONOSCOPY WITH PROPOFOL;  Surgeon: Lin Landsman, MD;  Location: [facility];  Service: Gastroenterology;  Laterality: N/A;   COLONOSCOPY WITH PROPOFOL N/A 07/22/2022   Procedure: COLONOSCOPY WITH PROPOFOL;  Surgeon: Lin Landsman, MD;  Location: [facility] ENDOSCOPY;  Service: Gastroenterology;  Laterality: N/A;   ESOPHAGOGASTRODUODENOSCOPY (EGD) WITH PROPOFOL N/A 05/02/2022   Procedure: ESOPHAGOGASTRODUODENOSCOPY (EGD) WITH PROPOFOL;  Surgeon: Lin Landsman, MD;  Location:  [facility] ENDOSCOPY;  Service: Gastroenterology;  Laterality: N/A;   JOINT REPLACEMENT     KNEE ARTHROSCOPY Right    THYROID SURGERY     "removed tumor"   TOTAL KNEE ARTHROPLASTY Right 10/11/2015   TOTAL KNEE ARTHROPLASTY Right 10/11/2015   Procedure: TOTAL KNEE ARTHROPLASTY;  Surgeon: Ninetta Lights, MD;  Location: [facility];  Service: Orthopedics;  Laterality: Right;   TUBAL LIGATION     VAGINAL HYSTERECTOMY       Current Outpatient Medications:     amLODipine (NORVASC) 10 MG tablet, Take 1 tablet (10 mg total) by mouth daily. For high blood pressure, Disp: 30 tablet, Rfl: 0   Azelastine HCl 137 MCG/SPRAY SOLN, , Disp: , Rfl:    budesonide-formoterol (SYMBICORT) 80-4.5 MCG/ACT inhaler, Inhale 2 puffs into the lungs 2 (two) times daily., Disp: , Rfl:    cetirizine (ZYRTEC) 10 MG chewable tablet, Chew 10 mg by mouth at bedtime., Disp: , Rfl:    enalapril-hydrochlorothiazide (VASERETIC) 10-25 MG tablet, Take 0.5 tablets by mouth daily., Disp: , Rfl:    fexofenadine (ALLEGRA) 180 MG tablet, Take 180 mg by mouth daily., Disp: , Rfl:    hydrOXYzine (VISTARIL) 25 MG capsule, Take 25 mg by mouth daily as needed., Disp: , Rfl:    ibuprofen (ADVIL) 800 MG tablet, Take 800 mg by mouth every 8 (eight) hours as needed., Disp: , Rfl:    levothyroxine (SYNTHROID) 112 MCG tablet, Take 112 mcg by mouth every morning., Disp: , Rfl:    lisinopril-hydrochlorothiazide (ZESTORETIC) 10-12.5 MG tablet, Take 1 tablet by mouth daily., Disp: , Rfl:    metoprolol succinate (TOPROL-XL) 50 MG 24 hr tablet, Take 50 mg by mouth daily., Disp: , Rfl:    omeprazole (PRILOSEC) 40 MG capsule, Take 1 capsule (40 mg total) by mouth daily before breakfast., Disp: 30 capsule, Rfl: 0   ondansetron (ZOFRAN-ODT) 8 MG disintegrating tablet, Take 1 tablet (8 mg total) by mouth every 8 (eight) hours as needed for nausea or vomiting., Disp: 10 tablet, Rfl: 0   tirzepatide (MOUNJARO) 5 MG/0.5ML Pen, Inject 5 mg into the skin once a week., Disp: 2 mL, Rfl: 2   TRINTELLIX 20 MG TABS tablet, Take 20 mg by mouth daily., Disp: , Rfl:    valsartan-hydrochlorothiazide (DIOVAN-HCT) 160-25 MG tablet, Take by mouth daily., Disp: , Rfl:    Family History  Problem Relation Age of Onset   Breast cancer Neg Hx    Kidney disease Neg Hx      Social History   Tobacco Use   Smoking status: Never   Smokeless tobacco: Never  Vaping Use   Vaping Use: Never used  Substance Use Topics   Alcohol use:  Yes    Alcohol/week: 2.0 standard drinks of alcohol    Types: 1 Glasses of wine, 1 Shots of liquor per week   Drug use: No    Allergies as of 08/08/2022 - Review Complete 08/08/2022  Allergen Reaction Noted   Ibuprofen Other (See Comments) 09/27/2015   Metronidazole Swelling and Other (See Comments) 09/27/2015   Phenyltoloxamine-acetaminophen Hives 10/10/2015   Other Nausea And Vomiting 09/27/2015   Septra [sulfamethoxazole-trimethoprim] Nausea And Vomiting 12/15/2014    Review of Systems:    All systems reviewed and negative except where noted in HPI.   Physical Exam:  BP 118/72 (BP Location: Left Arm, Patient Position: Sitting, Cuff Size: Large)   Pulse 82   Temp 97.9 F (36.6 C) (Oral)   Ht 5\' 3"  (1.6 m)  Wt 271 lb 8 oz (123.2 kg)   BMI 48.09 kg/m  No LMP recorded. Patient has had a hysterectomy.  General:   Alert,  Well-developed, well-nourished, pleasant and cooperative in NAD Head:  Normocephalic and atraumatic. Eyes:  Sclera clear, no icterus.   Conjunctiva pink. Ears:  Normal auditory acuity. Nose:  No deformity, discharge, or lesions. Mouth:  No deformity or lesions,oropharynx pink & moist. Neck:  Supple; no masses or thyromegaly. Lungs:  Respirations even and unlabored.  Clear throughout to auscultation.   No wheezes, crackles, or rhonchi. No acute distress. Heart:  Regular rate and rhythm; no murmurs, clicks, rubs, or gallops. Abdomen:  Normal bowel sounds. Soft, non-tender and non-distended without masses, hepatosplenomegaly or hernias noted.  No guarding or rebound tenderness.   Rectal: Not performed Msk:  Symmetrical without gross deformities. Good, equal movement & strength bilaterally. Pulses:  Normal pulses noted. Extremities:  No clubbing or edema.  No cyanosis. Neurologic:  Alert and oriented x3;  grossly normal neurologically. Skin:  Intact without significant lesions or rashes. No jaundice. Psych:  Alert and cooperative. Normal mood and  affect.  Imaging Studies: Reviewed  Assessment and Plan:   Candice Watkins is a 52 y.o. pleasant female with metabolic syndrome, history of H. pylori infection s/p triple therapy based on upper endoscopy in 04/2022 for intermittent episodes of nausea and nonbloody, nonbilious emesis.  Symptoms of nausea and vomiting recurred about 9 days ago associated with black stools  Check CBC today Check H. pylori breath test Start omeprazole 40 mg once a day before breakfast after H. pylori breath test for 1 month only If above tests are normal, and if her symptoms are persistent, recommend gastric emptying study to evaluate for gastroparesis   Follow up based on the above workup   Cephas Darby, MD

## 2022-08-09 LAB — CBC
Hematocrit: 38.7 % (ref 34.0–46.6)
Hemoglobin: 12.5 g/dL (ref 11.1–15.9)
MCH: 29.1 pg (ref 26.6–33.0)
MCHC: 32.3 g/dL (ref 31.5–35.7)
MCV: 90 fL (ref 79–97)
Platelets: 331 10*3/uL (ref 150–450)
RBC: 4.29 x10E6/uL (ref 3.77–5.28)
RDW: 13 % (ref 11.7–15.4)
WBC: 8.2 10*3/uL (ref 3.4–10.8)

## 2022-08-09 LAB — H. PYLORI BREATH TEST: H pylori Breath Test: NEGATIVE

## 2022-08-12 ENCOUNTER — Telehealth: Payer: Self-pay

## 2022-08-12 DIAGNOSIS — R112 Nausea with vomiting, unspecified: Secondary | ICD-10-CM

## 2022-08-12 NOTE — Telephone Encounter (Signed)
Patient verbalized understanding of results. She is okay with getting gastric emptying study.  Do not take Zofran and omeprazole 8 hours before the scan. [facility] at 9:15am for a 9:30am. Nothing to eat or drink after midnight Called and informed patient of the instructions and patient verbalized understanding  of instructions

## 2022-08-12 NOTE — Telephone Encounter (Signed)
-----   Message from Lin Landsman, MD sent at 08/12/2022 12:48 PM EDT ----- Her hemoglobin and H. pylori breath test came back normal.  Recommend gastric emptying study for episodes of nausea and vomiting if patient is agreeable  RV

## 2022-08-26 DIAGNOSIS — F332 Major depressive disorder, recurrent severe without psychotic features: Secondary | ICD-10-CM | POA: Diagnosis not present

## 2022-08-26 DIAGNOSIS — R69 Illness, unspecified: Secondary | ICD-10-CM | POA: Diagnosis not present

## 2022-08-30 ENCOUNTER — Encounter: Payer: Self-pay | Admitting: Family

## 2022-09-01 MED ORDER — LACTULOSE 10 GM/15ML PO SOLN: 20.0000 g | Freq: Every day | ORAL | 0 refills | Status: DC | PRN

## 2022-09-04 ENCOUNTER — Other Ambulatory Visit: Payer: Self-pay | Admitting: Gastroenterology

## 2022-09-04 NOTE — Telephone Encounter (Signed)
Last office visit plan Check CBC today Check H. pylori breath test Start omeprazole 40 mg once a day before breakfast after H. pylori breath test for 1 month only If above tests are normal, and if her symptoms are persistent, recommend gastric emptying study to evaluate for gastroparesis

## 2022-09-07 ENCOUNTER — Other Ambulatory Visit: Payer: Self-pay | Admitting: Family

## 2022-09-08 ENCOUNTER — Other Ambulatory Visit: Payer: Self-pay | Admitting: Gastroenterology

## 2022-09-09 NOTE — Telephone Encounter (Signed)
Last office visit 08/08/22 GERD  Plan: Check CBC today Check H. pylori breath test Start omeprazole 40 mg once a day before breakfast after H. pylori breath test for 1 month only If above tests are normal, and if her symptoms are persistent, recommend gastric emptying study to evaluate for gastroparesis  Last refill 08/08/2022 0 refills   H pylori test negative

## 2022-09-10 ENCOUNTER — Ambulatory Visit (INDEPENDENT_AMBULATORY_CARE_PROVIDER_SITE_OTHER): Payer: Medicare HMO | Admitting: Family

## 2022-09-10 VITALS — BP 130/72 | HR 87 | Ht 63.0 in | Wt 269.6 lb

## 2022-09-10 DIAGNOSIS — E039 Hypothyroidism, unspecified: Secondary | ICD-10-CM

## 2022-09-10 DIAGNOSIS — I10 Essential (primary) hypertension: Secondary | ICD-10-CM

## 2022-09-10 DIAGNOSIS — E559 Vitamin D deficiency, unspecified: Secondary | ICD-10-CM | POA: Diagnosis not present

## 2022-09-10 DIAGNOSIS — E782 Mixed hyperlipidemia: Secondary | ICD-10-CM | POA: Diagnosis not present

## 2022-09-10 DIAGNOSIS — E538 Deficiency of other specified B group vitamins: Secondary | ICD-10-CM

## 2022-09-10 DIAGNOSIS — R7303 Prediabetes: Secondary | ICD-10-CM | POA: Diagnosis not present

## 2022-09-10 DIAGNOSIS — R946 Abnormal results of thyroid function studies: Secondary | ICD-10-CM

## 2022-09-10 MED ORDER — BUSPIRONE HCL 5 MG PO TABS: 5.0000 mg | ORAL_TABLET | Freq: Two times a day (BID) | ORAL | 0 refills | Status: DC

## 2022-09-11 ENCOUNTER — Encounter: Payer: Self-pay | Admitting: Family

## 2022-09-11 LAB — CBC WITH DIFFERENTIAL
Basophils Absolute: 0 10*3/uL (ref 0.0–0.2)
Basos: 1 %
EOS (ABSOLUTE): 0.2 10*3/uL (ref 0.0–0.4)
Eos: 4 %
Hematocrit: 36.6 % (ref 34.0–46.6)
Hemoglobin: 12.2 g/dL (ref 11.1–15.9)
Immature Grans (Abs): 0 10*3/uL (ref 0.0–0.1)
Immature Granulocytes: 0 %
Lymphocytes Absolute: 1.6 10*3/uL (ref 0.7–3.1)
Lymphs: 28 %
MCH: 29.3 pg (ref 26.6–33.0)
MCHC: 33.3 g/dL (ref 31.5–35.7)
MCV: 88 fL (ref 79–97)
Monocytes Absolute: 0.3 10*3/uL (ref 0.1–0.9)
Monocytes: 6 %
Neutrophils Absolute: 3.4 10*3/uL (ref 1.4–7.0)
Neutrophils: 61 %
RBC: 4.16 x10E6/uL (ref 3.77–5.28)
RDW: 13.4 % (ref 11.7–15.4)
WBC: 5.6 10*3/uL (ref 3.4–10.8)

## 2022-09-11 LAB — CMP14+EGFR
ALT: 10 IU/L (ref 0–32)
AST: 16 IU/L (ref 0–40)
Albumin/Globulin Ratio: 1.1 — ABNORMAL LOW (ref 1.2–2.2)
Albumin: 3.9 g/dL (ref 3.8–4.9)
Alkaline Phosphatase: 74 IU/L (ref 44–121)
BUN/Creatinine Ratio: 5 — ABNORMAL LOW (ref 9–23)
BUN: 6 mg/dL (ref 6–24)
Bilirubin Total: 0.9 mg/dL (ref 0.0–1.2)
CO2: 25 mmol/L (ref 20–29)
Calcium: 9.6 mg/dL (ref 8.7–10.2)
Chloride: 100 mmol/L (ref 96–106)
Creatinine, Ser: 1.21 mg/dL — ABNORMAL HIGH (ref 0.57–1.00)
Globulin, Total: 3.5 g/dL (ref 1.5–4.5)
Glucose: 90 mg/dL (ref 70–99)
Potassium: 4.4 mmol/L (ref 3.5–5.2)
Sodium: 138 mmol/L (ref 134–144)
Total Protein: 7.4 g/dL (ref 6.0–8.5)
eGFR: 54 mL/min/{1.73_m2} — ABNORMAL LOW (ref >59)

## 2022-09-11 LAB — TSH: TSH: 5.86 u[IU]/mL — ABNORMAL HIGH (ref 0.450–4.500)

## 2022-09-11 LAB — LIPID PANEL
Chol/HDL Ratio: 2.5 ratio (ref 0.0–4.4)
Cholesterol, Total: 158 mg/dL (ref 100–199)
HDL: 62 mg/dL (ref >39)
LDL Chol Calc (NIH): 79 mg/dL (ref 0–99)
Triglycerides: 90 mg/dL (ref 0–149)
VLDL Cholesterol Cal: 17 mg/dL (ref 5–40)

## 2022-09-11 LAB — HEMOGLOBIN A1C
Est. average glucose Bld gHb Est-mCnc: 111 mg/dL
Hgb A1c MFr Bld: 5.5 % (ref 4.8–5.6)

## 2022-09-11 LAB — VITAMIN D 25 HYDROXY (VIT D DEFICIENCY, FRACTURES): Vit D, 25-Hydroxy: 48.5 ng/mL (ref 30.0–100.0)

## 2022-09-11 LAB — VITAMIN B12: Vitamin B-12: 1678 pg/mL — ABNORMAL HIGH (ref 232–1245)

## 2022-09-11 NOTE — Progress Notes (Signed)
Established Patient Office Visit  Subjective:  Patient ID: Candice Watkins, female    DOB: 18-Nov-1970  Age: 52 y.o. MRN: [id number]  Chief Complaint  Patient presents with   Follow-up    3 month follow up    Patient is here today for her 3 months follow up.  She has been feeling fairly well since last appointment.   She does not have additional concerns to discuss today.  Labs are due today. She needs refills.   I have reviewed her active problem list, medication list, allergies, family history, notes from last encounter, lab results, and specialist notes for her appointment today.      No other concerns at this time.   Past Medical History:  Diagnosis Date   Anxiety    Arthritis    "knees, ankles" (10/11/2015)   Asthma    Depression    Diabetes mellitus without complication ([facility])    Heartburn    HLD (hyperlipidemia)    Hypertension    Hypothyroidism    Kidney stones    Migraine    "monthly" (10/11/2015)   PONV (postoperative nausea and vomiting) X 1   Primary hyperparathyroidism ([facility])    Seasonal allergies    Sleep apnea    Stomach ulcer     Past Surgical History:  Procedure Laterality Date   BREAST BIOPSY Bilateral    neg- core   CESAREAN SECTION  2000   COLONOSCOPY WITH PROPOFOL N/A 05/02/2022   Procedure: COLONOSCOPY WITH PROPOFOL;  Surgeon: Toney Reil, MD;  Location: [facility] ENDOSCOPY;  Service: Gastroenterology;  Laterality: N/A;   COLONOSCOPY WITH PROPOFOL N/A 07/22/2022   Procedure: COLONOSCOPY WITH PROPOFOL;  Surgeon: Toney Reil, MD;  Location: [facility] ENDOSCOPY;  Service: Gastroenterology;  Laterality: N/A;   ESOPHAGOGASTRODUODENOSCOPY (EGD) WITH PROPOFOL N/A 05/02/2022   Procedure: ESOPHAGOGASTRODUODENOSCOPY (EGD) WITH PROPOFOL;  Surgeon: Toney Reil, MD;  Location: [facility] ENDOSCOPY;  Service: Gastroenterology;  Laterality: N/A;   JOINT REPLACEMENT     KNEE ARTHROSCOPY Right    THYROID SURGERY     "removed tumor"   TOTAL  KNEE ARTHROPLASTY Right 10/11/2015   TOTAL KNEE ARTHROPLASTY Right 10/11/2015   Procedure: TOTAL KNEE ARTHROPLASTY;  Surgeon: Loreta Ave, MD;  Location: [facility] OR;  Service: Orthopedics;  Laterality: Right;   TUBAL LIGATION     VAGINAL HYSTERECTOMY      Social History   Socioeconomic History   Marital status: Single    Spouse name: Not on file   Number of children: Not on file   Years of education: Not on file   Highest education level: Not on file  Occupational History   Not on file  Tobacco Use   Smoking status: Never   Smokeless tobacco: Never  Vaping Use   Vaping Use: Never used  Substance and Sexual Activity   Alcohol use: Yes    Alcohol/week: 2.0 standard drinks of alcohol    Types: 1 Glasses of wine, 1 Shots of liquor per week   Drug use: No   Sexual activity: Yes    Birth control/protection: None    Comment: hysterectomy  Other Topics Concern   Not on file  Social History Narrative   Not on file   Social Determinants of Health   Financial Resource Strain: Not on file  Food Insecurity: Not on file  Transportation Needs: Not on file  Physical Activity: Not on file  Stress: Not on file  Social Connections: Not on file  Intimate Partner Violence:  Not on file    Family History  Problem Relation Age of Onset   Breast cancer Neg Hx    Kidney disease Neg Hx     Allergies  Allergen Reactions   Ibuprofen Other (See Comments)    History of Stomach Ulcers   Metronidazole Swelling and Other (See Comments)    EDEMA   Phenyltoloxamine-Acetaminophen Hives   Other Nausea And Vomiting    Cogesic   Septra [Sulfamethoxazole-Trimethoprim] Nausea And Vomiting    Review of Systems  Gastrointestinal:  Positive for abdominal pain.  All other systems reviewed and are negative.      Objective:   BP 130/72   Pulse 87   Ht  (1.6 m)   Wt 269 lb 9.6 oz (122.3 kg)   SpO2 98%   BMI 47.76 kg/m   Vitals:   09/10/22 1259  BP: 130/72  Pulse: 87  Height: 5'  3" (1.6 m)  Weight: 269 lb 9.6 oz (122.3 kg)  SpO2: 98%  BMI (Calculated): 47.77    Physical Exam Vitals and nursing note reviewed.  Constitutional:      Appearance: Normal appearance. She is obese.  HENT:     Head: Normocephalic.  Eyes:     Pupils: Pupils are equal, round, and reactive to light.  Cardiovascular:     Rate and Rhythm: Normal rate.  Pulmonary:     Effort: Pulmonary effort is normal.  Neurological:     General: No focal deficit present.     Mental Status: She is alert and oriented to person, place, and time. Mental status is at baseline.  Psychiatric:        Mood and Affect: Mood normal.        Behavior: Behavior normal.        Thought Content: Thought content normal.      Results for orders placed or performed in visit on 09/10/22  Lipid panel  Result Value Ref Range   Cholesterol, Total 158 100 - 199 mg/dL   Triglycerides 90 0 - 149 mg/dL   HDL 62 >69 mg/dL   VLDL Cholesterol Cal 17 5 - 40 mg/dL   LDL Chol Calc (NIH) 79 0 - 99 mg/dL   Chol/HDL Ratio 2.5 0.0 - 4.4 ratio  VITAMIN D 25 Hydroxy (Vit-D Deficiency, Fractures)  Result Value Ref Range   Vit D, 25-Hydroxy 48.5 30.0 - 100.0 ng/mL  CBC With Differential  Result Value Ref Range   WBC 5.6 3.4 - 10.8 x10E3/uL   RBC 4.16 3.77 - 5.28 x10E6/uL   Hemoglobin 12.2 11.1 - 15.9 g/dL   Hematocrit 62.9 52.8 - 46.6 %   MCV 88 79 - 97 fL   MCH 29.3 26.6 - 33.0 pg   MCHC 33.3 31.5 - 35.7 g/dL   RDW 41.3 24.4 - 01.0 %   Neutrophils 61 Not Estab. %   Lymphs 28 Not Estab. %   Monocytes 6 Not Estab. %   Eos 4 Not Estab. %   Basos 1 Not Estab. %   Neutrophils Absolute 3.4 1.4 - 7.0 x10E3/uL   Lymphocytes Absolute 1.6 0.7 - 3.1 x10E3/uL   Monocytes Absolute 0.3 0.1 - 0.9 x10E3/uL   EOS (ABSOLUTE) 0.2 0.0 - 0.4 x10E3/uL   Basophils Absolute 0.0 0.0 - 0.2 x10E3/uL   Immature Granulocytes 0 Not Estab. %   Immature Grans (Abs) 0.0 0.0 - 0.1 x10E3/uL  CMP14+EGFR  Result Value Ref Range   Glucose 90 70 - 99  mg/dL  BUN 6 6 - 24 mg/dL   Creatinine, Ser 1.61 (H) 0.57 - 1.00 mg/dL   eGFR 54 (L) >09 UE/AVW/0.98   BUN/Creatinine Ratio 5 (L) 9 - 23   Sodium 138 134 - 144 mmol/L   Potassium 4.4 3.5 - 5.2 mmol/L   Chloride 100 96 - 106 mmol/L   CO2 25 20 - 29 mmol/L   Calcium 9.6 8.7 - 10.2 mg/dL   Total Protein 7.4 6.0 - 8.5 g/dL   Albumin 3.9 3.8 - 4.9 g/dL   Globulin, Total 3.5 1.5 - 4.5 g/dL   Albumin/Globulin Ratio 1.1 (L) 1.2 - 2.2   Bilirubin Total 0.9 0.0 - 1.2 mg/dL   Alkaline Phosphatase 74 44 - 121 IU/L   AST 16 0 - 40 IU/L   ALT 10 0 - 32 IU/L  TSH  Result Value Ref Range   TSH 5.860 (H) 0.450 - 4.500 uIU/mL  Hemoglobin A1c  Result Value Ref Range   Hgb A1c MFr Bld 5.5 4.8 - 5.6 %   Est. average glucose Bld gHb Est-mCnc 111 mg/dL  Vitamin J19  Result Value Ref Range   Vitamin B-12 1,678 (H) 232 - 1,245 pg/mL    Recent Results (from the past 2160 hour(s))  H. pylori breath test     Status: None   Collection Time: 06/20/22  2:05 PM  Result Value Ref Range   H pylori Breath Test Negative Negative  Glucose, capillary     Status: None   Collection Time: 07/22/22  7:15 AM  Result Value Ref Range   Glucose-Capillary 99 70 - 99 mg/dL    Comment: Glucose reference range applies only to samples taken after fasting for at least 8 hours.  Surgical pathology     Status: None   Collection Time: 07/22/22  7:51 AM  Result Value Ref Range   SURGICAL PATHOLOGY      SURGICAL PATHOLOGY CASE: [id number] PATIENT: [facility] Surgical Pathology Report     Specimen Submitted: A. Colon polyps, asc; cold snare  Clinical History: Z86.010 hx of colon polyps. Colon polyps.      DIAGNOSIS: A.  COLON POLYP, ASCENDING; COLD SNARE: - TUBULAR ADENOMA. - NEGATIVE FOR HIGH-GRADE DYSPLASIA AND MALIGNANCY.  GROSS DESCRIPTION: A. Labeled: Cold snare ascending colon polyp Received: Formalin Collection time: 7:51 AM on 07/22/2022 Placed into formalin time: 7:51 AM on  07/22/2022 Tissue fragment(s): Multiple Size: Aggregate, 0.7 x 0.7 x 0.1 cm Description: Received are tan soft tissue fragments, admixed with intestinal debris.  The ratio of soft tissue to intestinal debris is 90: 10. Entirely submitted in 1 cassette.  CM 07/22/2022  Final Diagnosis performed by Elijah Birk, MD.   Electronically signed 07/23/2022 8:26:12AM The electronic signature indicates that the named Attending Pathologist has evaluated the specimen Technical  component performed at [facility], [address] [place], [place] [address] Lab: [phone] Dir: Jolene Schimke, MD, MMM  Professional component performed at [facility], [address] [place], [place], [place] [address] Lab: [phone] Dir: Beryle Quant, MD   CBC     Status: None   Collection Time: 08/08/22  3:25 PM  Result Value Ref Range   WBC 8.2 3.4 - 10.8 x10E3/uL   RBC 4.29 3.77 - 5.28 x10E6/uL   Hemoglobin 12.5 11.1 - 15.9 g/dL   Hematocrit 44.0 10.2 - 46.6 %   MCV 90 79 - 97 fL   MCH 29.1 26.6 - 33.0 pg   MCHC 32.3 31.5 - 35.7 g/dL  RDW 13.0 11.7 - 15.4 %   Platelets 331 150 - 450 x10E3/uL  H. pylori breath test     Status: None   Collection Time: 08/08/22  3:25 PM  Result Value Ref Range   H pylori Breath Test Negative Negative  Lipid panel     Status: None   Collection Time: 09/10/22  1:43 PM  Result Value Ref Range   Cholesterol, Total 158 100 - 199 mg/dL   Triglycerides 90 0 - 149 mg/dL   HDL 62 >62 mg/dL   VLDL Cholesterol Cal 17 5 - 40 mg/dL   LDL Chol Calc (NIH) 79 0 - 99 mg/dL   Chol/HDL Ratio 2.5 0.0 - 4.4 ratio    Comment:                                   T. Chol/HDL Ratio                                             Men  Women                               1/2 Avg.Risk  3.4    3.3                                   Avg.Risk  5.0    4.4                                2X Avg.Risk  9.6    7.1                                3X Avg.Risk 23.4   11.0   VITAMIN D 25 Hydroxy  (Vit-D Deficiency, Fractures)     Status: None   Collection Time: 09/10/22  1:43 PM  Result Value Ref Range   Vit D, 25-Hydroxy 48.5 30.0 - 100.0 ng/mL    Comment: Vitamin D deficiency has been defined by the Institute of Medicine and an Endocrine Society practice guideline as a level of serum 25-OH vitamin D less than 20 ng/mL (1,2). The Endocrine Society went on to further define vitamin D insufficiency as a level between 21 and 29 ng/mL (2). 1. IOM (Institute of Medicine). 2010. Dietary reference    intakes for calcium and D. [place]: The    [employer]. 2. Holick MF, Binkley Lester, Bischoff-Ferrari HA, et al.    Evaluation, treatment, and prevention of vitamin D    deficiency: an Endocrine Society clinical practice    guideline. JCEM. 2011 Jul; 96(7):1911-30.   CBC With Differential     Status: None   Collection Time: 09/10/22  1:43 PM  Result Value Ref Range   WBC 5.6 3.4 - 10.8 x10E3/uL   RBC 4.16 3.77 - 5.28 x10E6/uL   Hemoglobin 12.2 11.1 - 15.9 g/dL   Hematocrit 13.0 86.5 - 46.6 %   MCV 88 79 - 97 fL   MCH 29.3 26.6 - 33.0 pg   MCHC 33.3 31.5 - 35.7 g/dL  RDW 13.4 11.7 - 15.4 %   Neutrophils 61 Not Estab. %   Lymphs 28 Not Estab. %   Monocytes 6 Not Estab. %   Eos 4 Not Estab. %   Basos 1 Not Estab. %   Neutrophils Absolute 3.4 1.4 - 7.0 x10E3/uL   Lymphocytes Absolute 1.6 0.7 - 3.1 x10E3/uL   Monocytes Absolute 0.3 0.1 - 0.9 x10E3/uL   EOS (ABSOLUTE) 0.2 0.0 - 0.4 x10E3/uL   Basophils Absolute 0.0 0.0 - 0.2 x10E3/uL   Immature Granulocytes 0 Not Estab. %   Immature Grans (Abs) 0.0 0.0 - 0.1 x10E3/uL  CMP14+EGFR     Status: Abnormal   Collection Time: 09/10/22  1:43 PM  Result Value Ref Range   Glucose 90 70 - 99 mg/dL   BUN 6 6 - 24 mg/dL   Creatinine, Ser 1.19 (H) 0.57 - 1.00 mg/dL   eGFR 54 (L) >14 NW/GNF/6.21   BUN/Creatinine Ratio 5 (L) 9 - 23   Sodium 138 134 - 144 mmol/L   Potassium 4.4 3.5 - 5.2 mmol/L   Chloride 100 96 - 106 mmol/L    CO2 25 20 - 29 mmol/L   Calcium 9.6 8.7 - 10.2 mg/dL   Total Protein 7.4 6.0 - 8.5 g/dL   Albumin 3.9 3.8 - 4.9 g/dL   Globulin, Total 3.5 1.5 - 4.5 g/dL   Albumin/Globulin Ratio 1.1 (L) 1.2 - 2.2   Bilirubin Total 0.9 0.0 - 1.2 mg/dL   Alkaline Phosphatase 74 44 - 121 IU/L   AST 16 0 - 40 IU/L   ALT 10 0 - 32 IU/L  TSH     Status: Abnormal   Collection Time: 09/10/22  1:43 PM  Result Value Ref Range   TSH 5.860 (H) 0.450 - 4.500 uIU/mL  Hemoglobin A1c     Status: None   Collection Time: 09/10/22  1:43 PM  Result Value Ref Range   Hgb A1c MFr Bld 5.5 4.8 - 5.6 %    Comment:          Prediabetes: 5.7 - 6.4          Diabetes: >6.4          Glycemic control for adults with diabetes: <7.0    Est. average glucose Bld gHb Est-mCnc 111 mg/dL  Vitamin H08     Status: Abnormal   Collection Time: 09/10/22  1:43 PM  Result Value Ref Range   Vitamin B-12 1,678 (H) 232 - 1,245 pg/mL       Assessment & Plan:   Problem List Items Addressed This Visit     Hypothyroidism - Primary   Relevant Orders   CBC With Differential (Completed)   CMP14+EGFR (Completed)   TSH (Completed)   Other Visit Diagnoses     B12 deficiency due to diet       Relevant Orders   CBC With Differential (Completed)   CMP14+EGFR (Completed)   Vitamin B12 (Completed)   Vitamin D deficiency, unspecified       Relevant Orders   VITAMIN D 25 Hydroxy (Vit-D Deficiency, Fractures) (Completed)   CBC With Differential (Completed)   CMP14+EGFR (Completed)   Prediabetes       Relevant Orders   CBC With Differential (Completed)   CMP14+EGFR (Completed)   Hemoglobin A1c (Completed)   Mixed hyperlipidemia       Relevant Medications   rosuvastatin (CRESTOR) 10 MG tablet   Other Relevant Orders   Lipid panel (Completed)   CBC With Differential (  Completed)   CMP14+EGFR (Completed)   Essential hypertension, benign       Relevant Medications   rosuvastatin (CRESTOR) 10 MG tablet   Other Relevant Orders   CBC  With Differential (Completed)   CMP14+EGFR (Completed)       Return in about 3 months (around 12/10/2022) for F/U.   Total time spent: 30 minutes  Miki Kins, FNP  09/10/2022

## 2022-09-12 DIAGNOSIS — F339 Major depressive disorder, recurrent, unspecified: Secondary | ICD-10-CM | POA: Diagnosis not present

## 2022-09-22 ENCOUNTER — Encounter: Payer: Self-pay | Admitting: Family

## 2022-09-24 ENCOUNTER — Encounter: Payer: Self-pay | Admitting: Family

## 2022-09-24 NOTE — Addendum Note (Signed)
Addended by: Grayling Congress on: 09/24/2022 10:07 AM   Modules accepted: Orders

## 2022-09-26 ENCOUNTER — Encounter: Payer: Self-pay | Admitting: Family

## 2022-09-26 DIAGNOSIS — F332 Major depressive disorder, recurrent severe without psychotic features: Secondary | ICD-10-CM | POA: Diagnosis not present

## 2022-09-27 ENCOUNTER — Encounter
Admission: RE | Admit: 2022-09-27 | Discharge: 2022-09-27 | Disposition: A | Discharge status: Home / Self Care | Payer: Medicare HMO | Source: Ambulatory Visit | Attending: Gastroenterology | Admitting: Gastroenterology

## 2022-09-27 DIAGNOSIS — R112 Nausea with vomiting, unspecified: Secondary | ICD-10-CM

## 2022-09-27 DIAGNOSIS — K30 Functional dyspepsia: Secondary | ICD-10-CM | POA: Diagnosis not present

## 2022-09-27 MED ORDER — TECHNETIUM TC 99M SULFUR COLLOID
2.0000 | Freq: Once | INTRAVENOUS | Status: AC | PRN
  Administered 2022-09-27: 2 via ORAL

## 2022-10-04 ENCOUNTER — Other Ambulatory Visit: Payer: Self-pay | Admitting: Family

## 2022-10-07 ENCOUNTER — Other Ambulatory Visit: Payer: Medicare HMO

## 2022-10-07 ENCOUNTER — Other Ambulatory Visit: Payer: Self-pay | Admitting: Gastroenterology

## 2022-10-07 ENCOUNTER — Other Ambulatory Visit: Payer: Self-pay | Admitting: Family

## 2022-10-07 DIAGNOSIS — E039 Hypothyroidism, unspecified: Secondary | ICD-10-CM | POA: Diagnosis not present

## 2022-10-07 DIAGNOSIS — R946 Abnormal results of thyroid function studies: Secondary | ICD-10-CM | POA: Diagnosis not present

## 2022-10-09 LAB — TSH+T4F+T3FREE
Free T4: 1.81 ng/dL — ABNORMAL HIGH (ref 0.82–1.77)
T3, Free: 2.7 pg/mL (ref 2.0–4.4)
TSH: 1.13 u[IU]/mL (ref 0.450–4.500)

## 2022-10-09 LAB — SPECIMEN STATUS REPORT

## 2022-10-10 ENCOUNTER — Other Ambulatory Visit: Payer: Self-pay | Admitting: Family

## 2022-10-11 DIAGNOSIS — M1712 Unilateral primary osteoarthritis, left knee: Secondary | ICD-10-CM | POA: Diagnosis not present

## 2022-10-12 MED ORDER — METOPROLOL SUCCINATE ER 50 MG PO TB24: 50.0000 mg | ORAL_TABLET | Freq: Every day | ORAL | 1 refills | Status: DC

## 2022-10-12 MED ORDER — IBUPROFEN 800 MG PO TABS: 800.0000 mg | ORAL_TABLET | Freq: Three times a day (TID) | ORAL | 9 refills | Status: DC | PRN

## 2022-10-13 ENCOUNTER — Encounter: Payer: Self-pay | Admitting: Family

## 2022-10-15 ENCOUNTER — Ambulatory Visit: Payer: Medicare HMO | Admitting: Gastroenterology

## 2022-10-17 DIAGNOSIS — F339 Major depressive disorder, recurrent, unspecified: Secondary | ICD-10-CM | POA: Diagnosis not present

## 2022-10-28 DIAGNOSIS — M1712 Unilateral primary osteoarthritis, left knee: Secondary | ICD-10-CM | POA: Diagnosis not present

## 2022-10-28 DIAGNOSIS — M1612 Unilateral primary osteoarthritis, left hip: Secondary | ICD-10-CM | POA: Diagnosis not present

## 2022-11-04 DIAGNOSIS — M25552 Pain in left hip: Secondary | ICD-10-CM | POA: Diagnosis not present

## 2022-11-08 ENCOUNTER — Other Ambulatory Visit: Payer: Self-pay | Admitting: Family

## 2022-11-08 DIAGNOSIS — F332 Major depressive disorder, recurrent severe without psychotic features: Secondary | ICD-10-CM | POA: Diagnosis not present

## 2022-11-08 DIAGNOSIS — R7303 Prediabetes: Secondary | ICD-10-CM

## 2022-11-14 DIAGNOSIS — F339 Major depressive disorder, recurrent, unspecified: Secondary | ICD-10-CM | POA: Diagnosis not present

## 2022-11-25 ENCOUNTER — Other Ambulatory Visit: Payer: Self-pay

## 2022-11-25 MED ORDER — MOUNJARO 7.5 MG/0.5ML ~~LOC~~ SOAJ: 7.5000 mg | SUBCUTANEOUS | 3 refills | Status: DC

## 2022-11-28 ENCOUNTER — Other Ambulatory Visit: Payer: Self-pay | Admitting: Family

## 2022-12-10 ENCOUNTER — Encounter: Payer: Self-pay | Admitting: Family

## 2022-12-10 ENCOUNTER — Ambulatory Visit (INDEPENDENT_AMBULATORY_CARE_PROVIDER_SITE_OTHER): Payer: Medicare HMO | Admitting: Family

## 2022-12-10 VITALS — BP 112/82 | HR 76 | Ht 64.0 in | Wt 257.6 lb

## 2022-12-10 DIAGNOSIS — E538 Deficiency of other specified B group vitamins: Secondary | ICD-10-CM | POA: Diagnosis not present

## 2022-12-10 DIAGNOSIS — E039 Hypothyroidism, unspecified: Secondary | ICD-10-CM

## 2022-12-10 DIAGNOSIS — E559 Vitamin D deficiency, unspecified: Secondary | ICD-10-CM

## 2022-12-10 DIAGNOSIS — I10 Essential (primary) hypertension: Secondary | ICD-10-CM

## 2022-12-10 DIAGNOSIS — R7303 Prediabetes: Secondary | ICD-10-CM

## 2022-12-10 DIAGNOSIS — E782 Mixed hyperlipidemia: Secondary | ICD-10-CM

## 2022-12-10 NOTE — Progress Notes (Unsigned)
Established Patient Office Visit  Subjective:  Patient ID: Candice Watkins, female    DOB: [birth year]  Age: 52 y.o. MRN: [id number]  Chief Complaint  Patient presents with   Follow-up    Patient is here today for her 3 months follow up.  She has been feeling fairly well since last appointment.   She does not have additional concerns to discuss today.  Her stomach issues have improved, but she is having some constipation issues.  She does admit that she is not getting enough fiber in her diet.   Labs are due today. She needs refills.   I have reviewed her active problem list, medication list, allergies, notes from last encounter, lab results for her appointment today.      No other concerns at this time.   Past Medical History:  Diagnosis Date   Anxiety    Arthritis    "knees, ankles" (10/11/2015)   Asthma    Depression    Diabetes mellitus without complication (HCC)    Heartburn    HLD (hyperlipidemia)    Hypertension    Hypothyroidism    Kidney stones    Migraine    "monthly" (10/11/2015)   PONV (postoperative nausea and vomiting) X 1   Primary hyperparathyroidism (HCC)    Seasonal allergies    Sleep apnea    Stomach ulcer     Past Surgical History:  Procedure Laterality Date   BREAST BIOPSY Bilateral    neg- core   CESAREAN SECTION  2000   COLONOSCOPY WITH PROPOFOL N/A 05/02/2022   Procedure: COLONOSCOPY WITH PROPOFOL;  Surgeon: Toney Reil, MD;  Location: [facility] ENDOSCOPY;  Service: Gastroenterology;  Laterality: N/A;   COLONOSCOPY WITH PROPOFOL N/A 07/22/2022   Procedure: COLONOSCOPY WITH PROPOFOL;  Surgeon: Toney Reil, MD;  Location: [facility] ENDOSCOPY;  Service: Gastroenterology;  Laterality: N/A;   ESOPHAGOGASTRODUODENOSCOPY (EGD) WITH PROPOFOL N/A 05/02/2022   Procedure: ESOPHAGOGASTRODUODENOSCOPY (EGD) WITH PROPOFOL;  Surgeon: Toney Reil, MD;  Location: [facility] ENDOSCOPY;  Service: Gastroenterology;  Laterality: N/A;   JOINT  REPLACEMENT     KNEE ARTHROSCOPY Right    THYROID SURGERY     "removed tumor"   TOTAL KNEE ARTHROPLASTY Right 10/11/2015   TOTAL KNEE ARTHROPLASTY Right 10/11/2015   Procedure: TOTAL KNEE ARTHROPLASTY;  Surgeon: Loreta Ave, MD;  Location: [facility] OR;  Service: Orthopedics;  Laterality: Right;   TUBAL LIGATION     VAGINAL HYSTERECTOMY      Social History   Socioeconomic History   Marital status: Single    Spouse name: Not on file   Number of children: Not on file   Years of education: Not on file   Highest education level: Not on file  Occupational History   Not on file  Tobacco Use   Smoking status: Never   Smokeless tobacco: Never  Vaping Use   Vaping status: Never Used  Substance and Sexual Activity   Alcohol use: Yes    Alcohol/week: 2.0 standard drinks of alcohol    Types: 1 Glasses of wine, 1 Shots of liquor per week   Drug use: No   Sexual activity: Yes    Birth control/protection: None    Comment: hysterectomy  Other Topics Concern   Not on file  Social History Narrative   Not on file   Social Determinants of Health   Financial Resource Strain: Not on file  Food Insecurity: Not on file  Transportation Needs: Not on file  Physical Activity: Not on  file  Stress: Not on file  Social Connections: Not on file  Intimate Partner Violence: Not on file    Family History  Problem Relation Age of Onset   Breast cancer Neg Hx    Kidney disease Neg Hx     Allergies  Allergen Reactions   Ibuprofen Other (See Comments)    History of Stomach Ulcers   Metronidazole Swelling and Other (See Comments)    EDEMA   Phenyltoloxamine-Acetaminophen Hives   Other Nausea And Vomiting    Cogesic   Septra [Sulfamethoxazole-Trimethoprim] Nausea And Vomiting    Review of Systems  All other systems reviewed and are negative.      Objective:   BP 112/82   Pulse 76   Ht 5\' 4"  (1.626 m)   Wt 257 lb 9.6 oz (116.8 kg)   SpO2 97%   BMI 44.22 kg/m   Vitals:    12/10/22 1323  BP: 112/82  Pulse: 76  Height: 5\' 4"  (1.626 m)  Weight: 257 lb 9.6 oz (116.8 kg)  SpO2: 97%  BMI (Calculated): 44.2    Physical Exam Vitals and nursing note reviewed.  Constitutional:      Appearance: Normal appearance. She is normal weight.  HENT:     Head: Normocephalic.  Eyes:     Extraocular Movements: Extraocular movements intact.     Conjunctiva/sclera: Conjunctivae normal.     Pupils: Pupils are equal, round, and reactive to light.  Cardiovascular:     Rate and Rhythm: Normal rate.  Pulmonary:     Effort: Pulmonary effort is normal.  Neurological:     Mental Status: She is alert.      Results for orders placed or performed in visit on 12/10/22  Lipid panel  Result Value Ref Range   Cholesterol, Total 166 100 - 199 mg/dL   Triglycerides 75 0 - 149 mg/dL   HDL 57 >60 mg/dL   VLDL Cholesterol Cal 14 5 - 40 mg/dL   LDL Chol Calc (NIH) 95 0 - 99 mg/dL   Chol/HDL Ratio 2.9 0.0 - 4.4 ratio  VITAMIN D 25 Hydroxy (Vit-D Deficiency, Fractures)  Result Value Ref Range   Vit D, 25-Hydroxy 46.1 30.0 - 100.0 ng/mL  CBC With Differential  Result Value Ref Range   WBC 5.4 3.4 - 10.8 x10E3/uL   RBC 4.22 3.77 - 5.28 x10E6/uL   Hemoglobin 12.4 11.1 - 15.9 g/dL   Hematocrit 45.4 09.8 - 46.6 %   MCV 90 79 - 97 fL   MCH 29.4 26.6 - 33.0 pg   MCHC 32.7 31.5 - 35.7 g/dL   RDW 11.9 14.7 - 82.9 %   Neutrophils 65 Not Estab. %   Lymphs 26 Not Estab. %   Monocytes 5 Not Estab. %   Eos 3 Not Estab. %   Basos 1 Not Estab. %   Neutrophils Absolute 3.5 1.4 - 7.0 x10E3/uL   Lymphocytes Absolute 1.4 0.7 - 3.1 x10E3/uL   Monocytes Absolute 0.3 0.1 - 0.9 x10E3/uL   EOS (ABSOLUTE) 0.2 0.0 - 0.4 x10E3/uL   Basophils Absolute 0.0 0.0 - 0.2 x10E3/uL   Immature Granulocytes 0 Not Estab. %   Immature Grans (Abs) 0.0 0.0 - 0.1 x10E3/uL  CMP14+EGFR  Result Value Ref Range   Glucose 90 70 - 99 mg/dL   BUN 12 6 - 24 mg/dL   Creatinine, Ser 5.62 (H) 0.57 - 1.00 mg/dL   eGFR  47 (L) >13 YQ/MVH/8.46   BUN/Creatinine Ratio 9 9 - 23  Sodium 138 134 - 144 mmol/L   Potassium 3.5 3.5 - 5.2 mmol/L   Chloride 98 96 - 106 mmol/L   CO2 27 20 - 29 mmol/L   Calcium 10.3 (H) 8.7 - 10.2 mg/dL   Total Protein 7.5 6.0 - 8.5 g/dL   Albumin 4.2 3.8 - 4.9 g/dL   Globulin, Total 3.3 1.5 - 4.5 g/dL   Bilirubin Total 0.9 0.0 - 1.2 mg/dL   Alkaline Phosphatase 77 44 - 121 IU/L   AST 15 0 - 40 IU/L   ALT 11 0 - 32 IU/L  TSH  Result Value Ref Range   TSH 0.195 (L) 0.450 - 4.500 uIU/mL  Hemoglobin A1c  Result Value Ref Range   Hgb A1c MFr Bld 5.8 (H) 4.8 - 5.6 %   Est. average glucose Bld gHb Est-mCnc 120 mg/dL  Vitamin K16  Result Value Ref Range   Vitamin B-12 1,126 232 - 1,245 pg/mL    Recent Results (from the past 2160 hour(s))  TSH+T4F+T3Free     Status: Abnormal   Collection Time: 10/07/22 12:00 AM  Result Value Ref Range   TSH 1.130 0.450 - 4.500 uIU/mL   T3, Free 2.7 2.0 - 4.4 pg/mL   Free T4 1.81 (H) 0.82 - 1.77 ng/dL  Specimen status report     Status: None   Collection Time: 10/07/22 12:00 AM  Result Value Ref Range   specimen status report Comment     Comment: Please note Please note The date and/or time of collection was not indicated on the requisition as required by state and federal law.  The date of receipt of the specimen was used as the collection date if not supplied.   Lipid panel     Status: None   Collection Time: 12/10/22  1:47 PM  Result Value Ref Range   Cholesterol, Total 166 100 - 199 mg/dL   Triglycerides 75 0 - 149 mg/dL   HDL 57 >01 mg/dL   VLDL Cholesterol Cal 14 5 - 40 mg/dL   LDL Chol Calc (NIH) 95 0 - 99 mg/dL   Chol/HDL Ratio 2.9 0.0 - 4.4 ratio    Comment:                                   T. Chol/HDL Ratio                                             Men  Women                               1/2 Avg.Risk  3.4    3.3                                   Avg.Risk  5.0    4.4                                2X Avg.Risk  9.6     7.1  3X Avg.Risk 23.4   11.0   VITAMIN D 25 Hydroxy (Vit-D Deficiency, Fractures)     Status: None   Collection Time: 12/10/22  1:47 PM  Result Value Ref Range   Vit D, 25-Hydroxy 46.1 30.0 - 100.0 ng/mL    Comment: Vitamin D deficiency has been defined by the Institute of Medicine and an Endocrine Society practice guideline as a level of serum 25-OH vitamin D less than 20 ng/mL (1,2). The Endocrine Society went on to further define vitamin D insufficiency as a level between 21 and 29 ng/mL (2). 1. IOM (Institute of Medicine). 2010. Dietary reference    intakes for calcium and D. [place]: The    [employer]. 2. Holick MF, Binkley [place], Bischoff-Ferrari HA, et al.    Evaluation, treatment, and prevention of vitamin D    deficiency: an Endocrine Society clinical practice    guideline. JCEM. 2011 Jul; 96(7):1911-30.   CBC With Differential     Status: None   Collection Time: 12/10/22  1:47 PM  Result Value Ref Range   WBC 5.4 3.4 - 10.8 x10E3/uL   RBC 4.22 3.77 - 5.28 x10E6/uL   Hemoglobin 12.4 11.1 - 15.9 g/dL   Hematocrit 91.4 78.2 - 46.6 %   MCV 90 79 - 97 fL   MCH 29.4 26.6 - 33.0 pg   MCHC 32.7 31.5 - 35.7 g/dL   RDW 95.6 21.3 - 08.6 %   Neutrophils 65 Not Estab. %   Lymphs 26 Not Estab. %   Monocytes 5 Not Estab. %   Eos 3 Not Estab. %   Basos 1 Not Estab. %   Neutrophils Absolute 3.5 1.4 - 7.0 x10E3/uL   Lymphocytes Absolute 1.4 0.7 - 3.1 x10E3/uL   Monocytes Absolute 0.3 0.1 - 0.9 x10E3/uL   EOS (ABSOLUTE) 0.2 0.0 - 0.4 x10E3/uL   Basophils Absolute 0.0 0.0 - 0.2 x10E3/uL   Immature Granulocytes 0 Not Estab. %   Immature Grans (Abs) 0.0 0.0 - 0.1 x10E3/uL    Comment: **Effective December 16, 2022, profile [id number] CBC/Differential**   (No Platelet) will be made non-orderable. [facility] Offers:   [id number] CBC With Differential/Platelet   CMP14+EGFR     Status: Abnormal   Collection Time: 12/10/22  1:47 PM  Result Value Ref  Range   Glucose 90 70 - 99 mg/dL   BUN 12 6 - 24 mg/dL   Creatinine, Ser 6.29 (H) 0.57 - 1.00 mg/dL   eGFR 47 (L) >52 WU/XLK/4.40   BUN/Creatinine Ratio 9 9 - 23   Sodium 138 134 - 144 mmol/L   Potassium 3.5 3.5 - 5.2 mmol/L   Chloride 98 96 - 106 mmol/L   CO2 27 20 - 29 mmol/L   Calcium 10.3 (H) 8.7 - 10.2 mg/dL   Total Protein 7.5 6.0 - 8.5 g/dL   Albumin 4.2 3.8 - 4.9 g/dL   Globulin, Total 3.3 1.5 - 4.5 g/dL   Bilirubin Total 0.9 0.0 - 1.2 mg/dL   Alkaline Phosphatase 77 44 - 121 IU/L   AST 15 0 - 40 IU/L   ALT 11 0 - 32 IU/L  TSH     Status: Abnormal   Collection Time: 12/10/22  1:47 PM  Result Value Ref Range   TSH 0.195 (L) 0.450 - 4.500 uIU/mL  Hemoglobin A1c     Status: Abnormal   Collection Time: 12/10/22  1:47 PM  Result Value Ref Range   Hgb A1c MFr Bld 5.8 (H) 4.8 - 5.6 %  Comment:          Prediabetes: 5.7 - 6.4          Diabetes: >6.4          Glycemic control for adults with diabetes: <7.0    Est. average glucose Bld gHb Est-mCnc 120 mg/dL  Vitamin I34     Status: None   Collection Time: 12/10/22  1:47 PM  Result Value Ref Range   Vitamin B-12 1,126 232 - 1,245 pg/mL       Assessment & Plan:   Problem List Items Addressed This Visit       Active Problems   Hypothyroidism (acquired) - Primary    Patient stable.  Well controlled with current therapy.   Continue current meds.        Relevant Orders   CBC With Differential (Completed)   CMP14+EGFR (Completed)   TSH (Completed)   Prediabetes    A1C Continues to be in prediabetic ranges.  Will reassess at follow up after next lab check.  Patient counseled on dietary choices and verbalized understanding.  Patient educated on foods that contain carbohydrates and the need to decrease intake.  We discussed prediabetes, and what it means and the need for strict dietary control to prevent progression to type 2 diabetes.  Advised to decrease intake of sugary drinks, including sodas, sweet tea, and  some juices, and of starch and sugar heavy foods (ie., potatoes, rice, bread, pasta, desserts). She verbalizes understanding and agreement with the changes discussed today.        Relevant Orders   CBC With Differential (Completed)   CMP14+EGFR (Completed)   Hemoglobin A1c (Completed)   Mixed hyperlipidemia    Checking labs today.  Continue current therapy for lipid control. Will modify as needed based on labwork results.       Relevant Orders   Lipid panel (Completed)   CBC With Differential (Completed)   CMP14+EGFR (Completed)   Essential hypertension, benign    Blood pressure well controlled with current medications.  Continue current therapy.  Will reassess at follow up.       Relevant Orders   CBC With Differential (Completed)   CMP14+EGFR (Completed)   Vitamin D deficiency, unspecified    Checking labs today.  Will continue supplements as needed.       Relevant Orders   VITAMIN D 25 Hydroxy (Vit-D Deficiency, Fractures) (Completed)   CBC With Differential (Completed)   CMP14+EGFR (Completed)   B12 deficiency due to diet    Checking labs today.  Will continue supplements as needed.        Relevant Orders   CBC With Differential (Completed)   CMP14+EGFR (Completed)   Vitamin B12 (Completed)    Return in about 4 months (around 04/12/2023).   Total time spent: 20 minutes  Miki Kins, FNP  12/10/2022   This document may have been prepared by [facility] Voice Recognition software and as such may include unintentional dictation errors.

## 2022-12-11 ENCOUNTER — Encounter: Payer: Self-pay | Admitting: Family

## 2022-12-11 DIAGNOSIS — R7303 Prediabetes: Secondary | ICD-10-CM | POA: Insufficient documentation

## 2022-12-11 DIAGNOSIS — E538 Deficiency of other specified B group vitamins: Secondary | ICD-10-CM | POA: Insufficient documentation

## 2022-12-11 DIAGNOSIS — E785 Hyperlipidemia, unspecified: Secondary | ICD-10-CM | POA: Insufficient documentation

## 2022-12-11 DIAGNOSIS — E559 Vitamin D deficiency, unspecified: Secondary | ICD-10-CM | POA: Insufficient documentation

## 2022-12-11 DIAGNOSIS — I10 Essential (primary) hypertension: Secondary | ICD-10-CM | POA: Insufficient documentation

## 2022-12-11 DIAGNOSIS — E782 Mixed hyperlipidemia: Secondary | ICD-10-CM | POA: Insufficient documentation

## 2022-12-11 LAB — CBC WITH DIFFERENTIAL
Basophils Absolute: 0 10*3/uL (ref 0.0–0.2)
Basos: 1 %
EOS (ABSOLUTE): 0.2 10*3/uL (ref 0.0–0.4)
Eos: 3 %
Hematocrit: 37.9 % (ref 34.0–46.6)
Hemoglobin: 12.4 g/dL (ref 11.1–15.9)
Immature Grans (Abs): 0 10*3/uL (ref 0.0–0.1)
Immature Granulocytes: 0 %
Lymphocytes Absolute: 1.4 10*3/uL (ref 0.7–3.1)
Lymphs: 26 %
MCH: 29.4 pg (ref 26.6–33.0)
MCHC: 32.7 g/dL (ref 31.5–35.7)
MCV: 90 fL (ref 79–97)
Monocytes Absolute: 0.3 10*3/uL (ref 0.1–0.9)
Monocytes: 5 %
Neutrophils Absolute: 3.5 10*3/uL (ref 1.4–7.0)
Neutrophils: 65 %
RBC: 4.22 x10E6/uL (ref 3.77–5.28)
RDW: 13.8 % (ref 11.7–15.4)
WBC: 5.4 10*3/uL (ref 3.4–10.8)

## 2022-12-11 LAB — CMP14+EGFR
ALT: 11 IU/L (ref 0–32)
AST: 15 IU/L (ref 0–40)
Albumin: 4.2 g/dL (ref 3.8–4.9)
Alkaline Phosphatase: 77 IU/L (ref 44–121)
BUN/Creatinine Ratio: 9 (ref 9–23)
BUN: 12 mg/dL (ref 6–24)
Bilirubin Total: 0.9 mg/dL (ref 0.0–1.2)
CO2: 27 mmol/L (ref 20–29)
Calcium: 10.3 mg/dL — ABNORMAL HIGH (ref 8.7–10.2)
Chloride: 98 mmol/L (ref 96–106)
Creatinine, Ser: 1.37 mg/dL — ABNORMAL HIGH (ref 0.57–1.00)
Globulin, Total: 3.3 g/dL (ref 1.5–4.5)
Glucose: 90 mg/dL (ref 70–99)
Potassium: 3.5 mmol/L (ref 3.5–5.2)
Sodium: 138 mmol/L (ref 134–144)
Total Protein: 7.5 g/dL (ref 6.0–8.5)
eGFR: 47 mL/min/{1.73_m2} — ABNORMAL LOW (ref >59)

## 2022-12-11 LAB — HEMOGLOBIN A1C
Est. average glucose Bld gHb Est-mCnc: 120 mg/dL
Hgb A1c MFr Bld: 5.8 % — ABNORMAL HIGH (ref 4.8–5.6)

## 2022-12-11 LAB — LIPID PANEL
Chol/HDL Ratio: 2.9 ratio (ref 0.0–4.4)
Cholesterol, Total: 166 mg/dL (ref 100–199)
HDL: 57 mg/dL (ref >39)
LDL Chol Calc (NIH): 95 mg/dL (ref 0–99)
Triglycerides: 75 mg/dL (ref 0–149)
VLDL Cholesterol Cal: 14 mg/dL (ref 5–40)

## 2022-12-11 LAB — VITAMIN D 25 HYDROXY (VIT D DEFICIENCY, FRACTURES): Vit D, 25-Hydroxy: 46.1 ng/mL (ref 30.0–100.0)

## 2022-12-11 LAB — TSH: TSH: 0.195 u[IU]/mL — ABNORMAL LOW (ref 0.450–4.500)

## 2022-12-11 LAB — VITAMIN B12: Vitamin B-12: 1126 pg/mL (ref 232–1245)

## 2022-12-11 NOTE — Assessment & Plan Note (Signed)
Blood pressure well controlled with current medications.  Continue current therapy.  Will reassess at follow up.  

## 2022-12-11 NOTE — Assessment & Plan Note (Signed)

## 2022-12-11 NOTE — Assessment & Plan Note (Signed)
Checking labs today.  Will continue supplements as needed.  

## 2022-12-11 NOTE — Assessment & Plan Note (Signed)
Checking labs today.  Continue current therapy for lipid control. Will modify as needed based on labwork results.  

## 2022-12-11 NOTE — Assessment & Plan Note (Signed)
Patient stable.  Well controlled with current therapy.   Continue current meds.  

## 2022-12-14 ENCOUNTER — Other Ambulatory Visit: Payer: Self-pay | Admitting: Family

## 2022-12-17 DIAGNOSIS — F332 Major depressive disorder, recurrent severe without psychotic features: Secondary | ICD-10-CM | POA: Diagnosis not present

## 2022-12-19 DIAGNOSIS — F339 Major depressive disorder, recurrent, unspecified: Secondary | ICD-10-CM | POA: Diagnosis not present

## 2022-12-25 ENCOUNTER — Other Ambulatory Visit: Payer: Self-pay | Admitting: Family

## 2023-01-10 ENCOUNTER — Other Ambulatory Visit: Payer: Self-pay | Admitting: Family

## 2023-01-24 ENCOUNTER — Other Ambulatory Visit: Payer: Self-pay | Admitting: Family

## 2023-01-24 DIAGNOSIS — Z1231 Encounter for screening mammogram for malignant neoplasm of breast: Secondary | ICD-10-CM

## 2023-01-27 DIAGNOSIS — M25562 Pain in left knee: Secondary | ICD-10-CM | POA: Diagnosis not present

## 2023-01-27 DIAGNOSIS — M25512 Pain in left shoulder: Secondary | ICD-10-CM | POA: Diagnosis not present

## 2023-01-30 DIAGNOSIS — F339 Major depressive disorder, recurrent, unspecified: Secondary | ICD-10-CM | POA: Diagnosis not present

## 2023-02-11 DIAGNOSIS — Z1231 Encounter for screening mammogram for malignant neoplasm of breast: Secondary | ICD-10-CM

## 2023-02-14 DIAGNOSIS — F332 Major depressive disorder, recurrent severe without psychotic features: Secondary | ICD-10-CM | POA: Diagnosis not present

## 2023-02-14 DIAGNOSIS — M25561 Pain in right knee: Secondary | ICD-10-CM | POA: Diagnosis not present

## 2023-02-20 DIAGNOSIS — F339 Major depressive disorder, recurrent, unspecified: Secondary | ICD-10-CM | POA: Diagnosis not present

## 2023-02-21 ENCOUNTER — Other Ambulatory Visit: Payer: Self-pay | Admitting: Family

## 2023-02-27 ENCOUNTER — Ambulatory Visit
Admission: RE | Admit: 2023-02-27 | Discharge: 2023-02-27 | Disposition: A | Discharge status: Home / Self Care | Payer: Medicare HMO | Source: Ambulatory Visit | Attending: Family | Admitting: Family

## 2023-02-27 ENCOUNTER — Other Ambulatory Visit: Payer: Self-pay | Admitting: Family

## 2023-02-27 DIAGNOSIS — Z1231 Encounter for screening mammogram for malignant neoplasm of breast: Secondary | ICD-10-CM | POA: Diagnosis not present

## 2023-03-03 NOTE — Therapy (Unsigned)
OUTPATIENT PHYSICAL THERAPY SHOULDER EVALUATION   Patient Name: Candice Watkins MRN: [id number] DOB:1971/01/25, 52 y.o., female Today's Date: 03/05/2023  END OF SESSION:   Past Medical History:  Diagnosis Date   Anxiety    Arthritis    "knees, ankles" (10/11/2015)   Asthma    Depression    Diabetes mellitus without complication (HCC)    Heartburn    HLD (hyperlipidemia)    Hypertension    Hypothyroidism    Kidney stones    Migraine    "monthly" (10/11/2015)   PONV (postoperative nausea and vomiting) X 1   Primary hyperparathyroidism (HCC)    Seasonal allergies    Sleep apnea    Stomach ulcer    Past Surgical History:  Procedure Laterality Date   BREAST BIOPSY Bilateral    neg- core   CESAREAN SECTION  2000   COLONOSCOPY WITH PROPOFOL N/A 05/02/2022   Procedure: COLONOSCOPY WITH PROPOFOL;  Surgeon: Toney Reil, MD;  Location: [facility] ENDOSCOPY;  Service: Gastroenterology;  Laterality: N/A;   COLONOSCOPY WITH PROPOFOL N/A 07/22/2022   Procedure: COLONOSCOPY WITH PROPOFOL;  Surgeon: Toney Reil, MD;  Location: [facility] ENDOSCOPY;  Service: Gastroenterology;  Laterality: N/A;   ESOPHAGOGASTRODUODENOSCOPY (EGD) WITH PROPOFOL N/A 05/02/2022   Procedure: ESOPHAGOGASTRODUODENOSCOPY (EGD) WITH PROPOFOL;  Surgeon: Toney Reil, MD;  Location: [facility] ENDOSCOPY;  Service: Gastroenterology;  Laterality: N/A;   JOINT REPLACEMENT     KNEE ARTHROSCOPY Right    THYROID SURGERY     "removed tumor"   TOTAL KNEE ARTHROPLASTY Right 10/11/2015   TOTAL KNEE ARTHROPLASTY Right 10/11/2015   Procedure: TOTAL KNEE ARTHROPLASTY;  Surgeon: Loreta Ave, MD;  Location: [facility] OR;  Service: Orthopedics;  Laterality: Right;   TUBAL LIGATION     VAGINAL HYSTERECTOMY     Patient Active Problem List   Diagnosis Date Noted   Prediabetes 12/11/2022   Mixed hyperlipidemia 12/11/2022   Essential hypertension, benign 12/11/2022   Vitamin D deficiency, unspecified 12/11/2022   B12  deficiency due to diet 12/11/2022   History of colonic polyps 07/22/2022   Dyspepsia 05/02/2022   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   Screening for colon cancer 05/02/2022   Polyp of colon 05/02/2022   MDD (major depressive disorder), recurrent severe, without psychosis (HCC) 01/28/2017   S/P total knee replacement 10/11/2015   Osteoarthritis of knee 09/27/2013   Patellofemoral dysfunction 09/27/2013   Hypothyroidism (acquired) 09/14/2007   Allergic rhinitis 03/04/2006    PCP: Miki Kins, FNP   REFERRING PROVIDER: Sheral Apley, MD  REFERRING DIAG: Left Rotator Cuff Tendonitis  THERAPY DIAG:  Left shoulder tendinitis  Muscle weakness (generalized)  Rationale for Evaluation and Treatment: Rehabilitation  ONSET DATE: 2 months  SUBJECTIVE:  SUBJECTIVE STATEMENT: Reports a 2 month history of L shoulder pain but cannot cite aggravating factor.  Most noted during sleep.  Worsening over time.  Told she has a partial RC tear. Hand dominance: Right  PERTINENT HISTORY: None available  PAIN:  Are you having pain? Yes: NPRS scale: (worst)10/10 Pain location: L shoulder Pain description: ache Aggravating factors: activity, lifting, sleep positions Relieving factors: rest  PRECAUTIONS: None  RED FLAGS: None   WEIGHT BEARING RESTRICTIONS: No  FALLS:  Has patient fallen in last 6 months? No  OCCUPATION: Not working  PLOF: Independent  PATIENT GOALS:To resolve my shoulder symptoms  NEXT MD VISIT:   OBJECTIVE:  Note: Objective measures were completed at Evaluation unless otherwise noted.  DIAGNOSTIC FINDINGS:  none  PATIENT SURVEYS:  FOTO 52(67 predicted)  POSTURE: Rounded depressed shoulders  UPPER EXTREMITY ROM:   A/PROM Right eval Left eval  Shoulder flexion WNL 150/WFL   Shoulder extension WNL WNL  Shoulder abduction WNL 150/WFL  Shoulder adduction    Shoulder internal rotation WNL WFL P!  Shoulder external rotation WNL WFL P!  Elbow flexion    Elbow extension    Wrist flexion    Wrist extension    Wrist ulnar deviation    Wrist radial deviation    Wrist pronation    Wrist supination    (Blank rows = not tested)  UPPER EXTREMITY MMT:  MMT Right eval Left eval  Shoulder flexion 5 4-  Shoulder extension 5 4-  Shoulder abduction 5 4-  Shoulder adduction    Shoulder internal rotation 5 4-  Shoulder external rotation 5 4-  Middle trapezius    Lower trapezius    Elbow flexion    Elbow extension    Wrist flexion    Wrist extension    Wrist ulnar deviation    Wrist radial deviation    Wrist pronation    Wrist supination    Grip strength (lbs)    (Blank rows = not tested)  SHOULDER SPECIAL TESTS: Impingement tests: Neer impingement test: positive  and Hawkins/Kennedy impingement test: positive   Rotator cuff assessment: Drop arm test: negative, Empty can test: negative, and Hornblower's sign: negative Biceps assessment: Speed's test: negative  JOINT MOBILITY TESTING:  deferred  PALPATION:  Unremarkable for symptom reproduction   TODAY'S TREATMENT:                                                                                                                                         DATE: 03/05/23 Eval    PATIENT EDUCATION: Education details: Discussed eval findings, rehab rationale and POC and patient is in agreement  Person educated: Patient Education method: Explanation Education comprehension: verbalized understanding and needs further education  HOME EXERCISE PROGRAM: Access Code: [email or web address] URL: [email or web address] Date: 03/05/2023 Prepared by: Gustavus Bryant  Exercises - Standing Shoulder Scaption  - 2 x daily - 5 x weekly -  1 sets - 15 reps - Standing Shoulder Horizontal Abduction with Resistance  - 2  x daily - 5 x weekly - 2 sets - 15 reps - Shoulder External Rotation and Scapular Retraction with Resistance  - 2 x daily - 5 x weekly - 1 sets - 15 reps  ASSESSMENT:  CLINICAL IMPRESSION: Patient is a 52 y.o. female who was seen today for physical therapy evaluation and treatment for L shoulder pain due to underlying RC tear (per hr report).  She presents with mild restrictions in AROM but good PROM.  RC appears intact but discomfort reported with resisted testing and impingement maneuvers.  Patient is a good candidate for OPPT 1x/week pending MD f/u in 2 weeks to discuss possible surgical intervention.  OBJECTIVE IMPAIRMENTS: decreased knowledge of condition, decreased ROM, decreased strength, impaired perceived functional ability, impaired UE functional use, postural dysfunction, and pain.   ACTIVITY LIMITATIONS: carrying, lifting, and reach over head  PERSONAL FACTORS: Age, Time since onset of injury/illness/exacerbation, and 1 comorbidity: DM  are also affecting patient's functional outcome.   REHAB POTENTIAL: Good  CLINICAL DECISION MAKING: Stable/uncomplicated  EVALUATION COMPLEXITY: Low   GOALS: Goals reviewed with patient? No  SHORT TERM GOALS=LONG TERM GOALS: Target date: 04/16/2023  Patient to demonstrate independence in HEP  Baseline: [id number] Goal status: INITIAL  2.  Patient will score at least 67% on FOTO to signify clinically meaningful improvement in functional abilities.   Baseline: 52% Goal status: INITIAL  3.  Increase L shoulder strength to 4/5 Baseline:  MMT Right eval Left eval  Shoulder flexion 5 4-  Shoulder extension 5 4-  Shoulder abduction 5 4-  Shoulder adduction    Shoulder internal rotation 5 4-  Shoulder external rotation 5 4-   Goal status: INITIAL  4.  Increase AROM L shoulder to 160d in flexion and abduction Baseline:  A/PROM Right eval Left eval  Shoulder flexion WNL 150/WFL  Shoulder extension WNL WNL  Shoulder abduction WNL  150/WFL  Shoulder adduction    Shoulder internal rotation WNL WFL P!  Shoulder external rotation WNL WFL P!   Goal status: INITIAL     PLAN:  PT FREQUENCY: 1x/week  PT DURATION: 6 weeks  PLANNED INTERVENTIONS: 97164- PT Re-evaluation, 97110-Therapeutic exercises, 97530- Therapeutic activity, 97112- Neuromuscular re-education, 97535- Self Care, 81191- Manual therapy, 97014- Electrical stimulation (unattended), Dry Needling, Joint mobilization, Cryotherapy, and Moist heat  PLAN FOR NEXT SESSION: HEP review and update, manual techniques as appropriate, aerobic tasks, ROM and flexibility activities, strengthening and PREs, TPDN, gait and balance training as needed     Hildred Laser, PT 03/05/2023, 12:53 PM

## 2023-03-05 ENCOUNTER — Ambulatory Visit: Payer: Medicare HMO | Attending: Orthopedic Surgery

## 2023-03-05 DIAGNOSIS — M778 Other enthesopathies, not elsewhere classified: Secondary | ICD-10-CM | POA: Insufficient documentation

## 2023-03-05 DIAGNOSIS — M6281 Muscle weakness (generalized): Secondary | ICD-10-CM | POA: Insufficient documentation

## 2023-03-13 ENCOUNTER — Ambulatory Visit: Payer: Medicare HMO

## 2023-03-13 DIAGNOSIS — M778 Other enthesopathies, not elsewhere classified: Secondary | ICD-10-CM

## 2023-03-13 DIAGNOSIS — M6281 Muscle weakness (generalized): Secondary | ICD-10-CM | POA: Diagnosis not present

## 2023-03-13 NOTE — Therapy (Signed)
OUTPATIENT PHYSICAL THERAPY NOTE   Patient Name: Candice Watkins MRN: [id number] DOB:May 09, 1971, 52 y.o., female Today's Date: 03/13/2023  END OF SESSION:  PT End of Session - 03/13/23 0835     Visit Number 2    Number of Visits 6    Date for PT Re-Evaluation 05/05/23    Authorization Type [employer] MCR    PT Start Time 0831    PT Stop Time 0910    PT Time Calculation (min) 39 min    Activity Tolerance Patient tolerated treatment well;Patient limited by pain    Behavior During Therapy [facility] for tasks assessed/performed             Past Medical History:  Diagnosis Date   Anxiety    Arthritis    "knees, ankles" (10/11/2015)   Asthma    Depression    Diabetes mellitus without complication (HCC)    Heartburn    HLD (hyperlipidemia)    Hypertension    Hypothyroidism    Kidney stones    Migraine    "monthly" (10/11/2015)   PONV (postoperative nausea and vomiting) X 1   Primary hyperparathyroidism (HCC)    Seasonal allergies    Sleep apnea    Stomach ulcer    Past Surgical History:  Procedure Laterality Date   BREAST BIOPSY Bilateral    neg- core   CESAREAN SECTION  2000   COLONOSCOPY WITH PROPOFOL N/A 05/02/2022   Procedure: COLONOSCOPY WITH PROPOFOL;  Surgeon: Toney Reil, MD;  Location: [facility] ENDOSCOPY;  Service: Gastroenterology;  Laterality: N/A;   COLONOSCOPY WITH PROPOFOL N/A 07/22/2022   Procedure: COLONOSCOPY WITH PROPOFOL;  Surgeon: Toney Reil, MD;  Location: [facility] ENDOSCOPY;  Service: Gastroenterology;  Laterality: N/A;   ESOPHAGOGASTRODUODENOSCOPY (EGD) WITH PROPOFOL N/A 05/02/2022   Procedure: ESOPHAGOGASTRODUODENOSCOPY (EGD) WITH PROPOFOL;  Surgeon: Toney Reil, MD;  Location: [facility] ENDOSCOPY;  Service: Gastroenterology;  Laterality: N/A;   JOINT REPLACEMENT     KNEE ARTHROSCOPY Right    THYROID SURGERY     "removed tumor"   TOTAL KNEE ARTHROPLASTY Right 10/11/2015   TOTAL KNEE ARTHROPLASTY Right 10/11/2015   Procedure: TOTAL KNEE  ARTHROPLASTY;  Surgeon: Loreta Ave, MD;  Location: [facility] OR;  Service: Orthopedics;  Laterality: Right;   TUBAL LIGATION     VAGINAL HYSTERECTOMY     Patient Active Problem List   Diagnosis Date Noted   Prediabetes 12/11/2022   Mixed hyperlipidemia 12/11/2022   Essential hypertension, benign 12/11/2022   Vitamin D deficiency, unspecified 12/11/2022   B12 deficiency due to diet 12/11/2022   History of colonic polyps 07/22/2022   Dyspepsia 05/02/2022   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   Screening for colon cancer 05/02/2022   Polyp of colon 05/02/2022   MDD (major depressive disorder), recurrent severe, without psychosis (HCC) 01/28/2017   S/P total knee replacement 10/11/2015   Osteoarthritis of knee 09/27/2013   Patellofemoral dysfunction 09/27/2013   Hypothyroidism (acquired) 09/14/2007   Allergic rhinitis 03/04/2006    PCP: Miki Kins, FNP   REFERRING PROVIDER: Sheral Apley, MD  REFERRING DIAG: Left Rotator Cuff Tendonitis  THERAPY DIAG:  Left shoulder tendinitis  Muscle weakness (generalized)  Rationale for Evaluation and Treatment: Rehabilitation  ONSET DATE: 2 months  SUBJECTIVE:  SUBJECTIVE STATEMENT: Patient reports 5/10 pain at start of session. She continues to have interrupted sleep, reporting that she wakes up 2-3 times/night   Hand dominance: Right  PERTINENT HISTORY: None available  PAIN:  Are you having pain? Yes: NPRS scale: (worst)10/10 Pain location: L shoulder Pain description: ache Aggravating factors: activity, lifting, sleep positions Relieving factors: rest  PRECAUTIONS: None  RED FLAGS: None   WEIGHT BEARING RESTRICTIONS: No  FALLS:  Has patient fallen in last 6 months? No  OCCUPATION: Not working  PLOF: Independent  PATIENT GOALS:To  resolve my shoulder symptoms  NEXT MD VISIT:   OBJECTIVE:  Note: Objective measures were completed at Evaluation unless otherwise noted.  DIAGNOSTIC FINDINGS:  none  PATIENT SURVEYS:  FOTO 52(67 predicted)  POSTURE: Rounded depressed shoulders  UPPER EXTREMITY ROM:   A/PROM Right eval Left eval  Shoulder flexion WNL 150/WFL  Shoulder extension WNL WNL  Shoulder abduction WNL 150/WFL  Shoulder adduction    Shoulder internal rotation WNL WFL P!  Shoulder external rotation WNL WFL P!  Elbow flexion    Elbow extension    Wrist flexion    Wrist extension    Wrist ulnar deviation    Wrist radial deviation    Wrist pronation    Wrist supination    (Blank rows = not tested)  UPPER EXTREMITY MMT:  MMT Right eval Left eval  Shoulder flexion 5 4-  Shoulder extension 5 4-  Shoulder abduction 5 4-  Shoulder adduction    Shoulder internal rotation 5 4-  Shoulder external rotation 5 4-  Middle trapezius    Lower trapezius    Elbow flexion    Elbow extension    Wrist flexion    Wrist extension    Wrist ulnar deviation    Wrist radial deviation    Wrist pronation    Wrist supination    Grip strength (lbs)    (Blank rows = not tested)  SHOULDER SPECIAL TESTS: Impingement tests: Neer impingement test: positive  and Hawkins/Kennedy impingement test: positive   Rotator cuff assessment: Drop arm test: negative, Empty can test: negative, and Hornblower's sign: negative Biceps assessment: Speed's test: negative  JOINT MOBILITY TESTING:  deferred  PALPATION:  Unremarkable for symptom reproduction   TODAY'S TREATMENT:      [facility] Adult PT Treatment:                                                DATE: 03/13/2023  Therapeutic Exercise: UBE, level 1, fwd/back x 2 min each  Shoulder rows, RTB, 2 x 10  Shoulder ext/pull downs, RTB, 2 x 10  Supine chest press, 2.5# on dowel, 2 x 15 Supine shoulder flexion, 2.5# on dowel, 2 x 5, limited by p!  Shoulder isometrics with  manual resistance from clinician, 3 sec x 5 each  Supine Abduction/adduction from 90 deg flexion Supine Abduction/adduction from 20 deg flexion Supine D2 extension Seated ER/IR form 0 deg abduction  DATE: 03/05/23 Eval    PATIENT EDUCATION: Education details: Discussed eval findings, rehab rationale and POC and patient is in agreement  Person educated: Patient Education method: Explanation Education comprehension: verbalized understanding and needs further education  HOME EXERCISE PROGRAM: Access Code: [email or web address] URL: [email or web address] Date: 03/05/2023 Prepared by: Gustavus Bryant  Exercises - Standing Shoulder Scaption  - 2 x daily - 5 x weekly - 1 sets - 15 reps - Standing Shoulder Horizontal Abduction with Resistance  - 2 x daily - 5 x weekly - 2 sets - 15 reps - Shoulder External Rotation and Scapular Retraction with Resistance  - 2 x daily - 5 x weekly - 1 sets - 15 reps  ASSESSMENT:  CLINICAL IMPRESSION: Gisselle had good overall tolerance of initial treatment session. She had most pain and difficulty with ER and shoulder flexion activities. She will continue to benefit from strengthening program, including shoulder isometrics as well as PNF interventions.   OBJECTIVE IMPAIRMENTS: decreased knowledge of condition, decreased ROM, decreased strength, impaired perceived functional ability, impaired UE functional use, postural dysfunction, and pain.   ACTIVITY LIMITATIONS: carrying, lifting, and reach over head  PERSONAL FACTORS: Age, Time since onset of injury/illness/exacerbation, and 1 comorbidity: DM  are also affecting patient's functional outcome.   REHAB POTENTIAL: Good  CLINICAL DECISION MAKING: Stable/uncomplicated  EVALUATION COMPLEXITY: Low   GOALS: Goals reviewed with patient? No  SHORT TERM GOALS=LONG TERM  GOALS: Target date: 04/16/2023  Patient to demonstrate independence in HEP  Baseline: [id number] Goal status: INITIAL  2.  Patient will score at least 67% on FOTO to signify clinically meaningful improvement in functional abilities.   Baseline: 52% Goal status: INITIAL  3.  Increase L shoulder strength to 4/5 Baseline:  MMT Right eval Left eval  Shoulder flexion 5 4-  Shoulder extension 5 4-  Shoulder abduction 5 4-  Shoulder adduction    Shoulder internal rotation 5 4-  Shoulder external rotation 5 4-   Goal status: INITIAL  4.  Increase AROM L shoulder to 160d in flexion and abduction Baseline:  A/PROM Right eval Left eval  Shoulder flexion WNL 150/WFL  Shoulder extension WNL WNL  Shoulder abduction WNL 150/WFL  Shoulder adduction    Shoulder internal rotation WNL WFL P!  Shoulder external rotation WNL WFL P!   Goal status: INITIAL     PLAN:  PT FREQUENCY: 1x/week  PT DURATION: 6 weeks  PLANNED INTERVENTIONS: 97164- PT Re-evaluation, 97110-Therapeutic exercises, 97530- Therapeutic activity, 97112- Neuromuscular re-education, 97535- Self Care, 84696- Manual therapy, 97014- Electrical stimulation (unattended), Dry Needling, Joint mobilization, Cryotherapy, and Moist heat  PLAN FOR NEXT SESSION: HEP review and update, manual techniques as appropriate, aerobic tasks, ROM and flexibility activities, strengthening and PREs, TPDN, gait and balance training as needed     Mauri Reading, PT, DPT   03/13/2023, 9:41 AM

## 2023-03-14 ENCOUNTER — Other Ambulatory Visit: Payer: Self-pay | Admitting: Family

## 2023-03-16 NOTE — Therapy (Deleted)
OUTPATIENT PHYSICAL THERAPY NOTE   Patient Name: Candice Watkins MRN: [id number] DOB:06-Dec-1970, 52 y.o., female Today's Date: 03/16/2023  END OF SESSION:    Past Medical History:  Diagnosis Date   Anxiety    Arthritis    "knees, ankles" (10/11/2015)   Asthma    Depression    Diabetes mellitus without complication (HCC)    Heartburn    HLD (hyperlipidemia)    Hypertension    Hypothyroidism    Kidney stones    Migraine    "monthly" (10/11/2015)   PONV (postoperative nausea and vomiting) X 1   Primary hyperparathyroidism (HCC)    Seasonal allergies    Sleep apnea    Stomach ulcer    Past Surgical History:  Procedure Laterality Date   BREAST BIOPSY Bilateral    neg- core   CESAREAN SECTION  2000   COLONOSCOPY WITH PROPOFOL N/A 05/02/2022   Procedure: COLONOSCOPY WITH PROPOFOL;  Surgeon: Toney Reil, MD;  Location: [facility] ENDOSCOPY;  Service: Gastroenterology;  Laterality: N/A;   COLONOSCOPY WITH PROPOFOL N/A 07/22/2022   Procedure: COLONOSCOPY WITH PROPOFOL;  Surgeon: Toney Reil, MD;  Location: [facility] ENDOSCOPY;  Service: Gastroenterology;  Laterality: N/A;   ESOPHAGOGASTRODUODENOSCOPY (EGD) WITH PROPOFOL N/A 05/02/2022   Procedure: ESOPHAGOGASTRODUODENOSCOPY (EGD) WITH PROPOFOL;  Surgeon: Toney Reil, MD;  Location: [facility] ENDOSCOPY;  Service: Gastroenterology;  Laterality: N/A;   JOINT REPLACEMENT     KNEE ARTHROSCOPY Right    THYROID SURGERY     "removed tumor"   TOTAL KNEE ARTHROPLASTY Right 10/11/2015   TOTAL KNEE ARTHROPLASTY Right 10/11/2015   Procedure: TOTAL KNEE ARTHROPLASTY;  Surgeon: Loreta Ave, MD;  Location: [facility] OR;  Service: Orthopedics;  Laterality: Right;   TUBAL LIGATION     VAGINAL HYSTERECTOMY     Patient Active Problem List   Diagnosis Date Noted   Prediabetes 12/11/2022   Mixed hyperlipidemia 12/11/2022   Essential hypertension, benign 12/11/2022   Vitamin D deficiency, unspecified 12/11/2022   B12 deficiency due to  diet 12/11/2022   History of colonic polyps 07/22/2022   Dyspepsia 05/02/2022   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   Screening for colon cancer 05/02/2022   Polyp of colon 05/02/2022   MDD (major depressive disorder), recurrent severe, without psychosis (HCC) 01/28/2017   S/P total knee replacement 10/11/2015   Osteoarthritis of knee 09/27/2013   Patellofemoral dysfunction 09/27/2013   Hypothyroidism (acquired) 09/14/2007   Allergic rhinitis 03/04/2006    PCP: Miki Kins, FNP   REFERRING PROVIDER: Sheral Apley, MD  REFERRING DIAG: Left Rotator Cuff Tendonitis  THERAPY DIAG:  No diagnosis found.  Rationale for Evaluation and Treatment: Rehabilitation  ONSET DATE: 2 months  SUBJECTIVE:  SUBJECTIVE STATEMENT: Patient reports 5/10 pain at start of session. She continues to have interrupted sleep, reporting that she wakes up 2-3 times/night   Hand dominance: Right  PERTINENT HISTORY: None available  PAIN:  Are you having pain? Yes: NPRS scale: (worst)10/10 Pain location: L shoulder Pain description: ache Aggravating factors: activity, lifting, sleep positions Relieving factors: rest  PRECAUTIONS: None  RED FLAGS: None   WEIGHT BEARING RESTRICTIONS: No  FALLS:  Has patient fallen in last 6 months? No  OCCUPATION: Not working  PLOF: Independent  PATIENT GOALS:To resolve my shoulder symptoms  NEXT MD VISIT:   OBJECTIVE:  Note: Objective measures were completed at Evaluation unless otherwise noted.  DIAGNOSTIC FINDINGS:  none  PATIENT SURVEYS:  FOTO 52(67 predicted)  POSTURE: Rounded depressed shoulders  UPPER EXTREMITY ROM:   A/PROM Right eval Left eval  Shoulder flexion WNL 150/WFL  Shoulder extension WNL WNL  Shoulder abduction WNL 150/WFL  Shoulder adduction     Shoulder internal rotation WNL WFL P!  Shoulder external rotation WNL WFL P!  Elbow flexion    Elbow extension    Wrist flexion    Wrist extension    Wrist ulnar deviation    Wrist radial deviation    Wrist pronation    Wrist supination    (Blank rows = not tested)  UPPER EXTREMITY MMT:  MMT Right eval Left eval  Shoulder flexion 5 4-  Shoulder extension 5 4-  Shoulder abduction 5 4-  Shoulder adduction    Shoulder internal rotation 5 4-  Shoulder external rotation 5 4-  Middle trapezius    Lower trapezius    Elbow flexion    Elbow extension    Wrist flexion    Wrist extension    Wrist ulnar deviation    Wrist radial deviation    Wrist pronation    Wrist supination    Grip strength (lbs)    (Blank rows = not tested)  SHOULDER SPECIAL TESTS: Impingement tests: Neer impingement test: positive  and Hawkins/Kennedy impingement test: positive   Rotator cuff assessment: Drop arm test: negative, Empty can test: negative, and Hornblower's sign: negative Biceps assessment: Speed's test: negative  JOINT MOBILITY TESTING:  deferred  PALPATION:  Unremarkable for symptom reproduction   TODAY'S TREATMENT:      [facility] Adult PT Treatment:                                                DATE: 03/13/2023  Therapeutic Exercise: UBE, level 1, fwd/back x 2 min each  Shoulder rows, RTB, 2 x 10  Shoulder ext/pull downs, RTB, 2 x 10  Supine chest press, 2.5# on dowel, 2 x 15 Supine shoulder flexion, 2.5# on dowel, 2 x 5, limited by p!  Shoulder isometrics with manual resistance from clinician, 3 sec x 5 each  Supine Abduction/adduction from 90 deg flexion Supine Abduction/adduction from 20 deg flexion Supine D2 extension Seated ER/IR form 0 deg abduction  DATE: 03/05/23 Eval    PATIENT EDUCATION: Education details: Discussed eval findings,  rehab rationale and POC and patient is in agreement  Person educated: Patient Education method: Explanation Education comprehension: verbalized understanding and needs further education  HOME EXERCISE PROGRAM: Access Code: [email or web address] URL: [email or web address] Date: 03/05/2023 Prepared by: Gustavus Bryant  Exercises - Standing Shoulder Scaption  - 2 x daily - 5 x weekly - 1 sets - 15 reps - Standing Shoulder Horizontal Abduction with Resistance  - 2 x daily - 5 x weekly - 2 sets - 15 reps - Shoulder External Rotation and Scapular Retraction with Resistance  - 2 x daily - 5 x weekly - 1 sets - 15 reps  ASSESSMENT:  CLINICAL IMPRESSION: Kataleena had good overall tolerance of initial treatment session. She had most pain and difficulty with ER and shoulder flexion activities. She will continue to benefit from strengthening program, including shoulder isometrics as well as PNF interventions.   OBJECTIVE IMPAIRMENTS: decreased knowledge of condition, decreased ROM, decreased strength, impaired perceived functional ability, impaired UE functional use, postural dysfunction, and pain.   ACTIVITY LIMITATIONS: carrying, lifting, and reach over head  PERSONAL FACTORS: Age, Time since onset of injury/illness/exacerbation, and 1 comorbidity: DM  are also affecting patient's functional outcome.   REHAB POTENTIAL: Good  CLINICAL DECISION MAKING: Stable/uncomplicated  EVALUATION COMPLEXITY: Low   GOALS: Goals reviewed with patient? No  SHORT TERM GOALS=LONG TERM GOALS: Target date: 04/16/2023  Patient to demonstrate independence in HEP  Baseline: [id number] Goal status: INITIAL  2.  Patient will score at least 67% on FOTO to signify clinically meaningful improvement in functional abilities.   Baseline: 52% Goal status: INITIAL  3.  Increase L shoulder strength to 4/5 Baseline:  MMT Right eval Left eval  Shoulder flexion 5 4-  Shoulder extension 5 4-  Shoulder abduction  5 4-  Shoulder adduction    Shoulder internal rotation 5 4-  Shoulder external rotation 5 4-   Goal status: INITIAL  4.  Increase AROM L shoulder to 160d in flexion and abduction Baseline:  A/PROM Right eval Left eval  Shoulder flexion WNL 150/WFL  Shoulder extension WNL WNL  Shoulder abduction WNL 150/WFL  Shoulder adduction    Shoulder internal rotation WNL WFL P!  Shoulder external rotation WNL WFL P!   Goal status: INITIAL     PLAN:  PT FREQUENCY: 1x/week  PT DURATION: 6 weeks  PLANNED INTERVENTIONS: 97164- PT Re-evaluation, 97110-Therapeutic exercises, 97530- Therapeutic activity, 97112- Neuromuscular re-education, 97535- Self Care, 54098- Manual therapy, 97014- Electrical stimulation (unattended), Dry Needling, Joint mobilization, Cryotherapy, and Moist heat  PLAN FOR NEXT SESSION: HEP review and update, manual techniques as appropriate, aerobic tasks, ROM and flexibility activities, strengthening and PREs, TPDN, gait and balance training as needed     Mauri Reading, PT, DPT   03/16/2023, 3:11 PM

## 2023-03-17 ENCOUNTER — Ambulatory Visit: Payer: Medicare HMO

## 2023-03-19 ENCOUNTER — Ambulatory Visit: Payer: Medicare HMO | Admitting: Family

## 2023-03-22 ENCOUNTER — Other Ambulatory Visit: Payer: Self-pay | Admitting: Gastroenterology

## 2023-03-22 ENCOUNTER — Other Ambulatory Visit: Payer: Self-pay | Admitting: Family

## 2023-03-24 ENCOUNTER — Other Ambulatory Visit: Payer: Self-pay | Admitting: Family

## 2023-03-24 NOTE — Therapy (Signed)
OUTPATIENT PHYSICAL THERAPY NOTE   Patient Name: Candice Watkins MRN: [id number] DOB:[birth year], 52 y.o., female Today's Date: 03/25/2023  END OF SESSION:  PT End of Session - 03/25/23 1311     Visit Number 3    Number of Visits 6    Date for PT Re-Evaluation 05/05/23    Authorization Type [employer] MCR    PT Start Time 1315    PT Stop Time 1355    PT Time Calculation (min) 40 min    Activity Tolerance Patient tolerated treatment well;Patient limited by pain    Behavior During Therapy [facility] for tasks assessed/performed              Past Medical History:  Diagnosis Date   Anxiety    Arthritis    "knees, ankles" (10/11/2015)   Asthma    Depression    Diabetes mellitus without complication (HCC)    Heartburn    HLD (hyperlipidemia)    Hypertension    Hypothyroidism    Kidney stones    Migraine    "monthly" (10/11/2015)   PONV (postoperative nausea and vomiting) X 1   Primary hyperparathyroidism (HCC)    Seasonal allergies    Sleep apnea    Stomach ulcer    Past Surgical History:  Procedure Laterality Date   BREAST BIOPSY Bilateral    neg- core   CESAREAN SECTION  2000   COLONOSCOPY WITH PROPOFOL N/A 05/02/2022   Procedure: COLONOSCOPY WITH PROPOFOL;  Surgeon: Toney Reil, MD;  Location: [facility] ENDOSCOPY;  Service: Gastroenterology;  Laterality: N/A;   COLONOSCOPY WITH PROPOFOL N/A 07/22/2022   Procedure: COLONOSCOPY WITH PROPOFOL;  Surgeon: Toney Reil, MD;  Location: [facility] ENDOSCOPY;  Service: Gastroenterology;  Laterality: N/A;   ESOPHAGOGASTRODUODENOSCOPY (EGD) WITH PROPOFOL N/A 05/02/2022   Procedure: ESOPHAGOGASTRODUODENOSCOPY (EGD) WITH PROPOFOL;  Surgeon: Toney Reil, MD;  Location: [facility] ENDOSCOPY;  Service: Gastroenterology;  Laterality: N/A;   JOINT REPLACEMENT     KNEE ARTHROSCOPY Right    THYROID SURGERY     "removed tumor"   TOTAL KNEE ARTHROPLASTY Right 10/11/2015   TOTAL KNEE ARTHROPLASTY Right 10/11/2015   Procedure: TOTAL  KNEE ARTHROPLASTY;  Surgeon: Loreta Ave, MD;  Location: [facility] OR;  Service: Orthopedics;  Laterality: Right;   TUBAL LIGATION     VAGINAL HYSTERECTOMY     Patient Active Problem List   Diagnosis Date Noted   Prediabetes 12/11/2022   Mixed hyperlipidemia 12/11/2022   Essential hypertension, benign 12/11/2022   Vitamin D deficiency, unspecified 12/11/2022   B12 deficiency due to diet 12/11/2022   History of colonic polyps 07/22/2022   Dyspepsia 05/02/2022   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   Screening for colon cancer 05/02/2022   Polyp of colon 05/02/2022   MDD (major depressive disorder), recurrent severe, without psychosis (HCC) 01/28/2017   S/P total knee replacement 10/11/2015   Osteoarthritis of knee 09/27/2013   Patellofemoral dysfunction 09/27/2013   Hypothyroidism (acquired) 09/14/2007   Allergic rhinitis 03/04/2006    PCP: Miki Kins, FNP   REFERRING PROVIDER: Sheral Apley, MD  REFERRING DIAG: Left Rotator Cuff Tendonitis  THERAPY DIAG:  Left shoulder tendinitis  Muscle weakness (generalized)  Rationale for Evaluation and Treatment: Rehabilitation  ONSET DATE: 2 months  SUBJECTIVE:  SUBJECTIVE STATEMENT: Feels she can tolerate the shoulder discomfort more, not sure if function is improving.  Will see ortho next week for f/u  Hand dominance: Right  PERTINENT HISTORY: None available  PAIN:  Are you having pain? Yes: NPRS scale: (worst)10/10 Pain location: L shoulder Pain description: ache Aggravating factors: activity, lifting, sleep positions Relieving factors: rest  PRECAUTIONS: None  RED FLAGS: None   WEIGHT BEARING RESTRICTIONS: No  FALLS:  Has patient fallen in last 6 months? No  OCCUPATION: Not working  PLOF: Independent  PATIENT GOALS:To resolve my  shoulder symptoms  NEXT MD VISIT:   OBJECTIVE:  Note: Objective measures were completed at Evaluation unless otherwise noted.  DIAGNOSTIC FINDINGS:  none  PATIENT SURVEYS:  FOTO 52(67 predicted)  POSTURE: Rounded depressed shoulders  UPPER EXTREMITY ROM:   A/PROM Right eval Left eval  Shoulder flexion WNL 150/WFL  Shoulder extension WNL WNL  Shoulder abduction WNL 150/WFL  Shoulder adduction    Shoulder internal rotation WNL WFL P!  Shoulder external rotation WNL WFL P!  Elbow flexion    Elbow extension    Wrist flexion    Wrist extension    Wrist ulnar deviation    Wrist radial deviation    Wrist pronation    Wrist supination    (Blank rows = not tested)  UPPER EXTREMITY MMT:  MMT Right eval Left eval  Shoulder flexion 5 4-  Shoulder extension 5 4-  Shoulder abduction 5 4-  Shoulder adduction    Shoulder internal rotation 5 4-  Shoulder external rotation 5 4-  Middle trapezius    Lower trapezius    Elbow flexion    Elbow extension    Wrist flexion    Wrist extension    Wrist ulnar deviation    Wrist radial deviation    Wrist pronation    Wrist supination    Grip strength (lbs)    (Blank rows = not tested)  SHOULDER SPECIAL TESTS: Impingement tests: Neer impingement test: positive  and Hawkins/Kennedy impingement test: positive   Rotator cuff assessment: Drop arm test: negative, Empty can test: negative, and Hornblower's sign: negative Biceps assessment: Speed's test: negative  JOINT MOBILITY TESTING:  deferred  PALPATION:  Unremarkable for symptom reproduction   TODAY'S TREATMENT:     [facility] Adult PT Treatment:                                                DATE: 03/25/23 Therapeutic Exercise: UBE L1 3/3 min Seated hor abduction YTB 8x  Supine hor abduction YTB 15x Supine press with protraction 1000g ball 15x Supine flexion 5000g ball 15x S/L ER 500g ball 15x S/L abduction 500g 15x Seated scaption 500g ball 15x Standing row YTB  15x Standing extension YTB 15x Manual PNF 15x Rhythmic stabilization 90d flexion 30s x3    [facility] Adult PT Treatment:                                                DATE: 03/13/2023  Therapeutic Exercise: UBE, level 1, fwd/back x 2 min each  Shoulder rows, RTB, 2 x 10  Shoulder ext/pull downs, RTB, 2 x 10  Supine chest press, 2.5# on dowel, 2 x 15 Supine shoulder  flexion, 2.5# on dowel, 2 x 5, limited by p!  Shoulder isometrics with manual resistance from clinician, 3 sec x 5 each  Supine Abduction/adduction from 90 deg flexion Supine Abduction/adduction from 20 deg flexion Supine D2 extension Seated ER/IR form 0 deg abduction                                                                                                                                        DATE: 03/05/23 Eval    PATIENT EDUCATION: Education details: Discussed eval findings, rehab rationale and POC and patient is in agreement  Person educated: Patient Education method: Explanation Education comprehension: verbalized understanding and needs further education  HOME EXERCISE PROGRAM: Access Code: [email or web address] URL: [email or web address] Date: 03/05/2023 Prepared by: Gustavus Bryant  Exercises - Standing Shoulder Scaption  - 2 x daily - 5 x weekly - 1 sets - 15 reps - Standing Shoulder Horizontal Abduction with Resistance  - 2 x daily - 5 x weekly - 2 sets - 15 reps - Shoulder External Rotation and Scapular Retraction with Resistance  - 2 x daily - 5 x weekly - 1 sets - 15 reps  ASSESSMENT:  CLINICAL IMPRESSION:  Continued impingement signs as well as pain with OH tasks.  Unable to tolerate positions and tasks that involve impingement positions.  Incorporated manual tasks including PNF and   OBJECTIVE IMPAIRMENTS: decreased knowledge of condition, decreased ROM, decreased strength, impaired perceived functional ability, impaired UE functional use, postural dysfunction, and pain.   ACTIVITY  LIMITATIONS: carrying, lifting, and reach over head  PERSONAL FACTORS: Age, Time since onset of injury/illness/exacerbation, and 1 comorbidity: DM  are also affecting patient's functional outcome.   REHAB POTENTIAL: Good  CLINICAL DECISION MAKING: Stable/uncomplicated  EVALUATION COMPLEXITY: Low   GOALS: Goals reviewed with patient? No  SHORT TERM GOALS=LONG TERM GOALS: Target date: 04/16/2023  Patient to demonstrate independence in HEP  Baseline: [id number] Goal status: INITIAL  2.  Patient will score at least 67% on FOTO to signify clinically meaningful improvement in functional abilities.   Baseline: 52% Goal status: INITIAL  3.  Increase L shoulder strength to 4/5 Baseline:  MMT Right eval Left eval  Shoulder flexion 5 4-  Shoulder extension 5 4-  Shoulder abduction 5 4-  Shoulder adduction    Shoulder internal rotation 5 4-  Shoulder external rotation 5 4-   Goal status: INITIAL  4.  Increase AROM L shoulder to 160d in flexion and abduction Baseline:  A/PROM Right eval Left eval  Shoulder flexion WNL 150/WFL  Shoulder extension WNL WNL  Shoulder abduction WNL 150/WFL  Shoulder adduction    Shoulder internal rotation WNL WFL P!  Shoulder external rotation WNL WFL P!   Goal status: INITIAL     PLAN:  PT FREQUENCY: 1x/week  PT DURATION: 6 weeks  PLANNED INTERVENTIONS: 97164- PT Re-evaluation, 97110-Therapeutic exercises, 97530- Therapeutic activity, [id number]- Neuromuscular re-education, 97535- Self  Care, [id number]- Manual therapy, 97014- Electrical stimulation (unattended), Dry Needling, Joint mobilization, Cryotherapy, and Moist heat  PLAN FOR NEXT SESSION: HEP review and update, manual techniques as appropriate, aerobic tasks, ROM and flexibility activities, strengthening and PREs, TPDN, gait and balance training as needed     Mauri Reading, PT, DPT   03/25/2023, 1:57 PM

## 2023-03-24 NOTE — Telephone Encounter (Signed)
Last office visit 08/08/2022 Nausea and vomiting black stools  Plan: Check CBC today Check H. pylori breath test Start omeprazole 40 mg once a day before breakfast after H. pylori breath test for 1 month only If above tests are normal, and if her symptoms are persistent, recommend gastric emptying study to evaluate for gastroparesis H pylori breath test 08/08/22 negative  Gastric emptying study 09/27/2022 normal  Follow up base on work up still  Last refill 09/09/2022 0 refills

## 2023-03-25 ENCOUNTER — Ambulatory Visit: Payer: Medicare HMO | Admitting: Family

## 2023-03-25 ENCOUNTER — Encounter: Payer: Self-pay | Admitting: Family

## 2023-03-25 ENCOUNTER — Ambulatory Visit: Payer: Medicare HMO | Attending: Orthopedic Surgery

## 2023-03-25 VITALS — BP 130/84 | HR 72 | Ht 64.0 in | Wt 255.0 lb

## 2023-03-25 DIAGNOSIS — M778 Other enthesopathies, not elsewhere classified: Secondary | ICD-10-CM | POA: Insufficient documentation

## 2023-03-25 DIAGNOSIS — R7303 Prediabetes: Secondary | ICD-10-CM

## 2023-03-25 DIAGNOSIS — E538 Deficiency of other specified B group vitamins: Secondary | ICD-10-CM | POA: Diagnosis not present

## 2023-03-25 DIAGNOSIS — E039 Hypothyroidism, unspecified: Secondary | ICD-10-CM

## 2023-03-25 DIAGNOSIS — I10 Essential (primary) hypertension: Secondary | ICD-10-CM

## 2023-03-25 DIAGNOSIS — M7582 Other shoulder lesions, left shoulder: Secondary | ICD-10-CM

## 2023-03-25 DIAGNOSIS — R5383 Other fatigue: Secondary | ICD-10-CM

## 2023-03-25 DIAGNOSIS — E559 Vitamin D deficiency, unspecified: Secondary | ICD-10-CM

## 2023-03-25 DIAGNOSIS — E782 Mixed hyperlipidemia: Secondary | ICD-10-CM

## 2023-03-25 DIAGNOSIS — M6281 Muscle weakness (generalized): Secondary | ICD-10-CM

## 2023-03-25 MED ORDER — CLONAZEPAM 0.5 MG PO TABS: 0.2500 mg | ORAL_TABLET | Freq: Two times a day (BID) | ORAL | 0 refills | Status: DC | PRN

## 2023-03-25 NOTE — Telephone Encounter (Signed)
Message has been sent to patient, just awaiting response

## 2023-03-26 LAB — CMP14+EGFR
ALT: 8 [IU]/L (ref 0–32)
AST: 17 [IU]/L (ref 0–40)
Albumin: 4.2 g/dL (ref 3.8–4.9)
Alkaline Phosphatase: 78 [IU]/L (ref 44–121)
BUN/Creatinine Ratio: 10 (ref 9–23)
BUN: 11 mg/dL (ref 6–24)
Bilirubin Total: 0.9 mg/dL (ref 0.0–1.2)
CO2: 24 mmol/L (ref 20–29)
Calcium: 10 mg/dL (ref 8.7–10.2)
Chloride: 100 mmol/L (ref 96–106)
Creatinine, Ser: 1.08 mg/dL — ABNORMAL HIGH (ref 0.57–1.00)
Globulin, Total: 3.3 g/dL (ref 1.5–4.5)
Glucose: 83 mg/dL (ref 70–99)
Potassium: 3.8 mmol/L (ref 3.5–5.2)
Sodium: 138 mmol/L (ref 134–144)
Total Protein: 7.5 g/dL (ref 6.0–8.5)
eGFR: 62 mL/min/{1.73_m2} (ref >59)

## 2023-03-26 LAB — HEMOGLOBIN A1C
Est. average glucose Bld gHb Est-mCnc: 111 mg/dL
Hgb A1c MFr Bld: 5.5 % (ref 4.8–5.6)

## 2023-03-26 LAB — CBC WITH DIFFERENTIAL/PLATELET
Basophils Absolute: 0.1 10*3/uL (ref 0.0–0.2)
Basos: 1 %
EOS (ABSOLUTE): 0.3 10*3/uL (ref 0.0–0.4)
Eos: 5 %
Hematocrit: 37.6 % (ref 34.0–46.6)
Hemoglobin: 12 g/dL (ref 11.1–15.9)
Immature Grans (Abs): 0 10*3/uL (ref 0.0–0.1)
Immature Granulocytes: 1 %
Lymphocytes Absolute: 1.8 10*3/uL (ref 0.7–3.1)
Lymphs: 28 %
MCH: 29.2 pg (ref 26.6–33.0)
MCHC: 31.9 g/dL (ref 31.5–35.7)
MCV: 92 fL (ref 79–97)
Monocytes Absolute: 0.3 10*3/uL (ref 0.1–0.9)
Monocytes: 5 %
Neutrophils Absolute: 3.9 10*3/uL (ref 1.4–7.0)
Neutrophils: 60 %
Platelets: 286 10*3/uL (ref 150–450)
RBC: 4.11 x10E6/uL (ref 3.77–5.28)
RDW: 13.1 % (ref 11.7–15.4)
WBC: 6.4 10*3/uL (ref 3.4–10.8)

## 2023-03-26 LAB — LIPID PANEL
Chol/HDL Ratio: 2.6 ratio (ref 0.0–4.4)
Cholesterol, Total: 171 mg/dL (ref 100–199)
HDL: 66 mg/dL (ref >39)
LDL Chol Calc (NIH): 91 mg/dL (ref 0–99)
Triglycerides: 76 mg/dL (ref 0–149)
VLDL Cholesterol Cal: 14 mg/dL (ref 5–40)

## 2023-03-26 LAB — IRON,TIBC AND FERRITIN PANEL
Ferritin: 84 ng/mL (ref 15–150)
Iron Saturation: 18 % (ref 15–55)
Iron: 63 ug/dL (ref 27–159)
Total Iron Binding Capacity: 353 ug/dL (ref 250–450)
UIBC: 290 ug/dL (ref 131–425)

## 2023-03-26 LAB — TSH+T4F+T3FREE
Free T4: 1.53 ng/dL (ref 0.82–1.77)
T3, Free: 2.1 pg/mL (ref 2.0–4.4)
TSH: 4.1 u[IU]/mL (ref 0.450–4.500)

## 2023-03-26 LAB — VITAMIN B12: Vitamin B-12: 958 pg/mL (ref 232–1245)

## 2023-03-26 LAB — VITAMIN D 25 HYDROXY (VIT D DEFICIENCY, FRACTURES): Vit D, 25-Hydroxy: 47.7 ng/mL (ref 30.0–100.0)

## 2023-03-27 DIAGNOSIS — F339 Major depressive disorder, recurrent, unspecified: Secondary | ICD-10-CM | POA: Diagnosis not present

## 2023-04-01 ENCOUNTER — Telehealth: Payer: Self-pay

## 2023-04-01 DIAGNOSIS — F332 Major depressive disorder, recurrent severe without psychotic features: Secondary | ICD-10-CM | POA: Diagnosis not present

## 2023-04-01 NOTE — Telephone Encounter (Signed)
P lvm requesting a call back but did not state why she needed

## 2023-04-01 NOTE — Therapy (Addendum)
OUTPATIENT PHYSICAL THERAPY NOTE/DISCHARGE   Patient Name: Candice Watkins MRN: [id number] DOB:03/30/1971, 52 y.o., female Today's Date: 04/02/2023 PHYSICAL THERAPY DISCHARGE SUMMARY  Visits from Start of Care: 4  Current functional level related to goals / functional outcomes: UTA   Remaining deficits: UTA   Education / Equipment: HEP   Patient agrees to discharge. Patient goals were partially met. Patient is being discharged due to not returning since the last visit.  END OF SESSION:  PT End of Session - 04/02/23 1401     Visit Number 4    Number of Visits 6    Date for PT Re-Evaluation 05/05/23    Authorization Type [employer]et[employer] MCR    PT Start Time 1401    PT Stop Time 1440    PT Time Calculation (min) 39 min    Activity Tolerance Patient tolerated treatment well;Patient limited by pain    Behavior During Therapy [facility] for tasks assessed/performed               Past Medical History:  Diagnosis Date   Anxiety    Arthritis    "knees, ankles" (10/11/2015)   Asthma    Depression    Diabetes mellitus without complication (HCC)    Heartburn    HLD (hyperlipidemia)    Hypertension    Hypothyroidism    Kidney stones    Migraine    "monthly" (10/11/2015)   PONV (postoperative nausea and vomiting) X 1   Primary hyperparathyroidism (HCC)    Seasonal allergies    Sleep apnea    Stomach ulcer    Past Surgical History:  Procedure Laterality Date   BREAST BIOPSY Bilateral    neg- core   CESAREAN SECTION  2000   COLONOSCOPY WITH PROPOFOL N/A 05/02/2022   Procedure: COLONOSCOPY WITH PROPOFOL;  Surgeon: Toney Reil, MD;  Location: [facility] ENDOSCOPY;  Service: Gastroenterology;  Laterality: N/A;   COLONOSCOPY WITH PROPOFOL N/A 07/22/2022   Procedure: COLONOSCOPY WITH PROPOFOL;  Surgeon: Toney Reil, MD;  Location: [facility] ENDOSCOPY;  Service: Gastroenterology;  Laterality: N/A;   ESOPHAGOGASTRODUODENOSCOPY (EGD) WITH PROPOFOL N/A 05/02/2022   Procedure:  ESOPHAGOGASTRODUODENOSCOPY (EGD) WITH PROPOFOL;  Surgeon: Toney Reil, MD;  Location: [facility] ENDOSCOPY;  Service: Gastroenterology;  Laterality: N/A;   JOINT REPLACEMENT     KNEE ARTHROSCOPY Right    THYROID SURGERY     "removed tumor"   TOTAL KNEE ARTHROPLASTY Right 10/11/2015   TOTAL KNEE ARTHROPLASTY Right 10/11/2015   Procedure: TOTAL KNEE ARTHROPLASTY;  Surgeon: Loreta Ave, MD;  Location: [facility] OR;  Service: Orthopedics;  Laterality: Right;   TUBAL LIGATION     VAGINAL HYSTERECTOMY     Patient Active Problem List   Diagnosis Date Noted   Prediabetes 12/11/2022   Mixed hyperlipidemia 12/11/2022   Essential hypertension, benign 12/11/2022   Vitamin D deficiency, unspecified 12/11/2022   B12 deficiency due to diet 12/11/2022   History of colonic polyps 07/22/2022   Dyspepsia 05/02/2022   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   Screening for colon cancer 05/02/2022   Polyp of colon 05/02/2022   MDD (major depressive disorder), recurrent severe, without psychosis (HCC) 01/28/2017   S/P total knee replacement 10/11/2015   Osteoarthritis of knee 09/27/2013   Patellofemoral dysfunction 09/27/2013   Hypothyroidism (acquired) 09/14/2007   Allergic rhinitis 03/04/2006    PCP: Miki Kins, FNP   REFERRING PROVIDER: Sheral Apley, MD  REFERRING DIAG: Left Rotator Cuff Tendonitis  THERAPY DIAG:  Left shoulder tendinitis  Muscle weakness (generalized)  Rationale for Evaluation and Treatment: Rehabilitation  ONSET DATE: 2 months  SUBJECTIVE:                                                                                                                                                                                      SUBJECTIVE STATEMENT:   Patient reporting good tolerance of exercises at last visit. She states that she has been practicing the aggravating motions, which has been improving. She has an appointment to f/u with ortho later today.    Hand dominance:  Right  PERTINENT HISTORY: None available  PAIN:  Are you having pain? Yes: NPRS scale: (worst)10/10 Pain location: L shoulder Pain description: ache Aggravating factors: activity, lifting, sleep positions Relieving factors: rest  PRECAUTIONS: None  RED FLAGS: None   WEIGHT BEARING RESTRICTIONS: No  FALLS:  Has patient fallen in last 6 months? No  OCCUPATION: Not working  PLOF: Independent  PATIENT GOALS:To resolve my shoulder symptoms  NEXT MD VISIT:   OBJECTIVE:  Note: Objective measures were completed at Evaluation unless otherwise noted.  DIAGNOSTIC FINDINGS:  none  PATIENT SURVEYS:  FOTO 52(67 predicted)  POSTURE: Rounded depressed shoulders  UPPER EXTREMITY ROM:   A/PROM Right eval Left eval  Shoulder flexion WNL 150/WFL  Shoulder extension WNL WNL  Shoulder abduction WNL 150/WFL  Shoulder adduction    Shoulder internal rotation WNL WFL P!  Shoulder external rotation WNL WFL P!  Elbow flexion    Elbow extension    Wrist flexion    Wrist extension    Wrist ulnar deviation    Wrist radial deviation    Wrist pronation    Wrist supination    (Blank rows = not tested)  UPPER EXTREMITY MMT:  MMT Right eval Left eval  Shoulder flexion 5 4-  Shoulder extension 5 4-  Shoulder abduction 5 4-  Shoulder adduction    Shoulder internal rotation 5 4-  Shoulder external rotation 5 4-  Middle trapezius    Lower trapezius    Elbow flexion    Elbow extension    Wrist flexion    Wrist extension    Wrist ulnar deviation    Wrist radial deviation    Wrist pronation    Wrist supination    Grip strength (lbs)    (Blank rows = not tested)  SHOULDER SPECIAL TESTS: Impingement tests: Neer impingement test: positive  and Hawkins/Kennedy impingement test: positive   Rotator cuff assessment: Drop arm test: negative, Empty can test: negative, and Hornblower's sign: negative Biceps assessment: Speed's test: negative  JOINT MOBILITY TESTING:   deferred  PALPATION:  Unremarkable for symptom reproduction   TODAY'S TREATMENT:  [facility] Adult PT Treatment:                                                DATE: 04/02/2023  Therapeutic Exercise: Seated pball shoulder flexion AROM x 15  Seated scaption 1# dumbbell 15x Seated flexion 1# dumbbell 15x Seated abduction 1# dumbbell 15x Supine serratus press with 1# dumbbell 15x Supine horizontal abduction red TB 15x S/L flexion 500g ball 15x S/L ER 500g ball 15x S/L abduction 500g 15x UBE L2 fwd/63min back   [facility] Adult PT Treatment:                                                DATE: 03/25/23 Therapeutic Exercise: UBE L1 3/3 min Seated hor abduction YTB 8x  Supine hor abduction YTB 15x Supine press with protraction 1000g ball 15x Supine flexion 5000g ball 15x S/L ER 500g ball 15x S/L abduction 500g 15x Seated scaption 500g ball 15x Standing row YTB 15x Standing extension YTB 15x Manual PNF 15x Rhythmic stabilization 90d flexion 30s x3    [facility] Adult PT Treatment:                                                DATE: 03/13/2023  Therapeutic Exercise: UBE, level 1, fwd/back x 2 min each  Shoulder rows, RTB, 2 x 10  Shoulder ext/pull downs, RTB, 2 x 10  Supine chest press, 2.5# on dowel, 2 x 15 Supine shoulder flexion, 2.5# on dowel, 2 x 5, limited by p!  Shoulder isometrics with manual resistance from clinician, 3 sec x 5 each  Supine Abduction/adduction from 90 deg flexion Supine Abduction/adduction from 20 deg flexion Supine D2 extension Seated ER/IR form 0 deg abduction                                                                                                                                        DATE: 03/05/23 Eval    PATIENT EDUCATION: Education details: Discussed eval findings, rehab rationale and POC and patient is in agreement  Person educated: Patient Education method: Explanation Education comprehension: verbalized understanding and needs  further education  HOME EXERCISE PROGRAM: Access Code: [email or web address] URL: [email or web address] Date: 03/05/2023 Prepared by: Gustavus Bryant  Exercises - Standing Shoulder Scaption  - 2 x daily - 5 x weekly - 1 sets - 15 reps - Standing Shoulder Horizontal Abduction with Resistance  - 2 x daily - 5 x weekly - 2 sets - 15 reps - Shoulder External Rotation  and Scapular Retraction with Resistance  - 2 x daily - 5 x weekly - 1 sets - 15 reps  ASSESSMENT:  CLINICAL IMPRESSION:    Patient is responding well to current shoulder and periscapular strengthening program. She continues to have impingement signs, however, she does have improved tolerance of OH tasks today. We will continue with current POC.   OBJECTIVE IMPAIRMENTS: decreased knowledge of condition, decreased ROM, decreased strength, impaired perceived functional ability, impaired UE functional use, postural dysfunction, and pain.   ACTIVITY LIMITATIONS: carrying, lifting, and reach over head  PERSONAL FACTORS: Age, Time since onset of injury/illness/exacerbation, and 1 comorbidity: DM  are also affecting patient's functional outcome.   REHAB POTENTIAL: Good  CLINICAL DECISION MAKING: Stable/uncomplicated  EVALUATION COMPLEXITY: Low   GOALS: Goals reviewed with patient? No  SHORT TERM GOALS=LONG TERM GOALS: Target date: 04/16/2023  Patient to demonstrate independence in HEP  Baseline: [id number] Goal status: INITIAL  2.  Patient will score at least 67% on FOTO to signify clinically meaningful improvement in functional abilities.   Baseline: 52% Goal status: INITIAL  3.  Increase L shoulder strength to 4/5 Baseline:  MMT Right eval Left eval  Shoulder flexion 5 4-  Shoulder extension 5 4-  Shoulder abduction 5 4-  Shoulder adduction    Shoulder internal rotation 5 4-  Shoulder external rotation 5 4-   Goal status: INITIAL  4.  Increase AROM L shoulder to 160d in flexion and abduction Baseline:   A/PROM Right eval Left eval  Shoulder flexion WNL 150/WFL  Shoulder extension WNL WNL  Shoulder abduction WNL 150/WFL  Shoulder adduction    Shoulder internal rotation WNL WFL P!  Shoulder external rotation WNL WFL P!   Goal status: INITIAL     PLAN:  PT FREQUENCY: 1x/week  PT DURATION: 6 weeks  PLANNED INTERVENTIONS: 97164- PT Re-evaluation, 97110-Therapeutic exercises, 97530- Therapeutic activity, 97112- Neuromuscular re-education, 97535- Self Care, [id number]- Manual therapy, 97014- Electrical stimulation (unattended), Dry Needling, Joint mobilization, Cryotherapy, and Moist heat  PLAN FOR NEXT SESSION: HEP review and update, manual techniques as appropriate, aerobic tasks, ROM and flexibility activities, strengthening and PREs, TPDN, gait and balance training as needed     Mauri Reading, PT, DPT   04/02/2023, 2:44 PM

## 2023-04-02 ENCOUNTER — Ambulatory Visit: Payer: Medicare HMO

## 2023-04-02 DIAGNOSIS — M778 Other enthesopathies, not elsewhere classified: Secondary | ICD-10-CM | POA: Diagnosis not present

## 2023-04-02 DIAGNOSIS — M6281 Muscle weakness (generalized): Secondary | ICD-10-CM | POA: Diagnosis not present

## 2023-04-02 DIAGNOSIS — M1612 Unilateral primary osteoarthritis, left hip: Secondary | ICD-10-CM | POA: Diagnosis not present

## 2023-04-09 IMAGING — MG MM DIGITAL SCREENING BILAT W/ TOMO AND CAD
8 of 15 series · 8 of 40 positions shown · non-contrast
Comparison: Previous exam(s).

CLINICAL DATA: Screening.

EXAM:
DIGITAL SCREENING BILATERAL MAMMOGRAM WITH TOMOSYNTHESIS AND CAD
TECHNIQUE: Bilateral screening digital craniocaudal and mediolateral oblique
mammograms were obtained. Bilateral screening digital breast
tomosynthesis was performed. The images were evaluated with
computer-aided detection.

[L CC synth-2D]
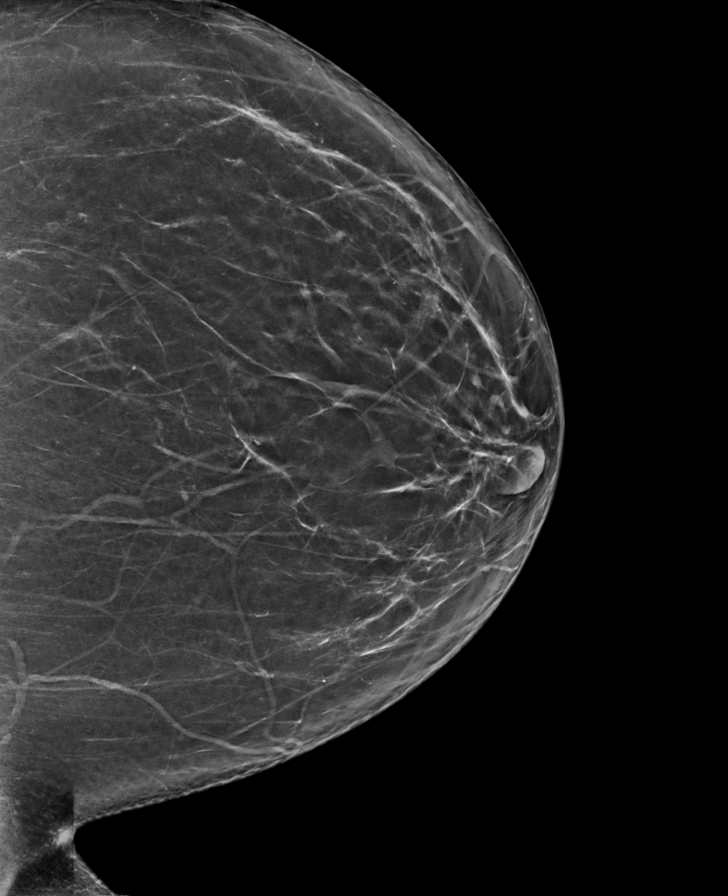

[L MLO synth-2D]
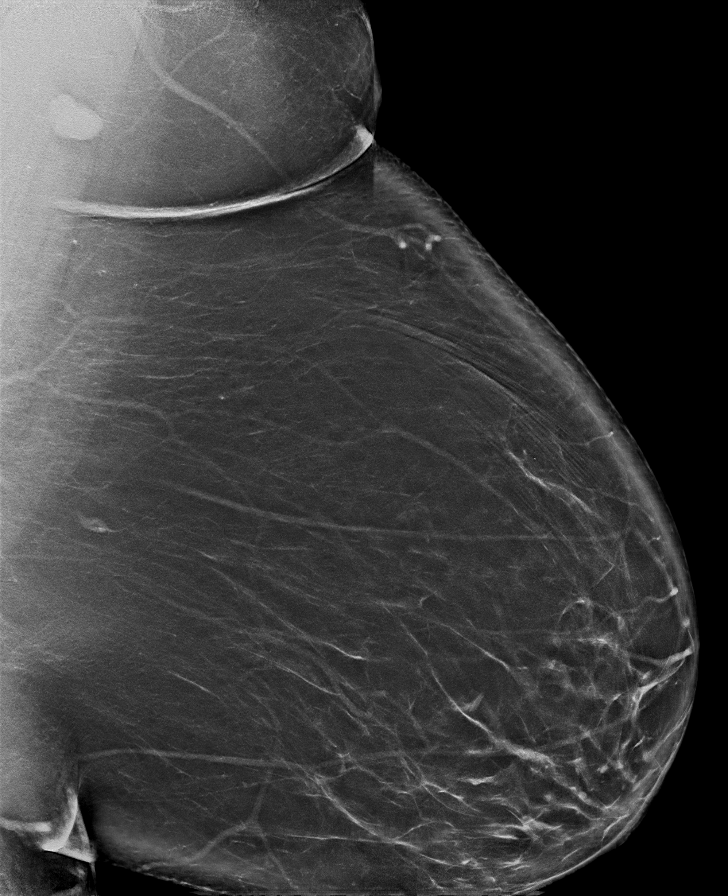

[R XCCL synth-2D]
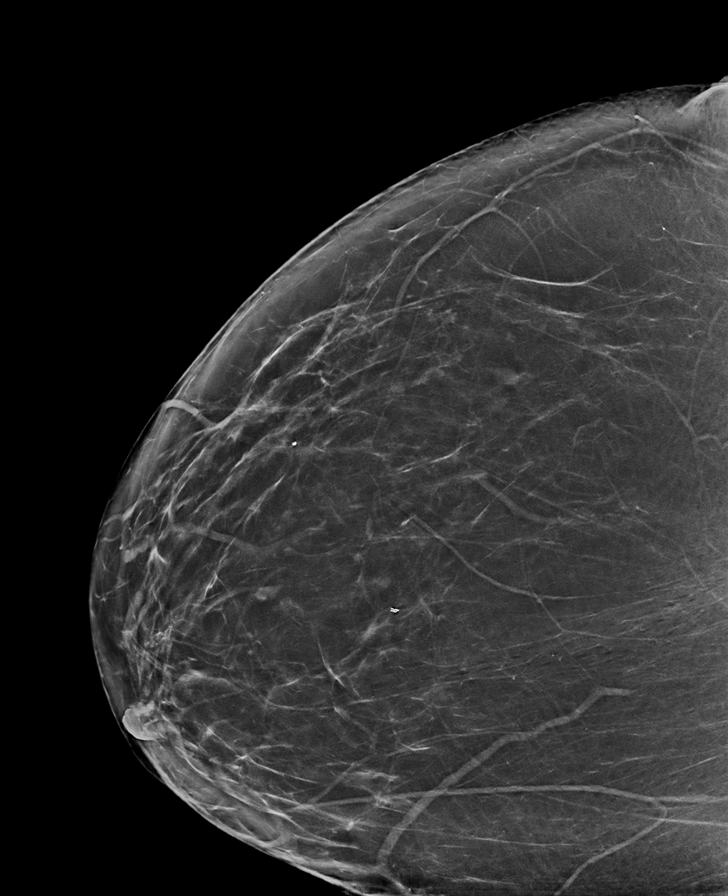

[R MLO synth-2D (1 of 2)]
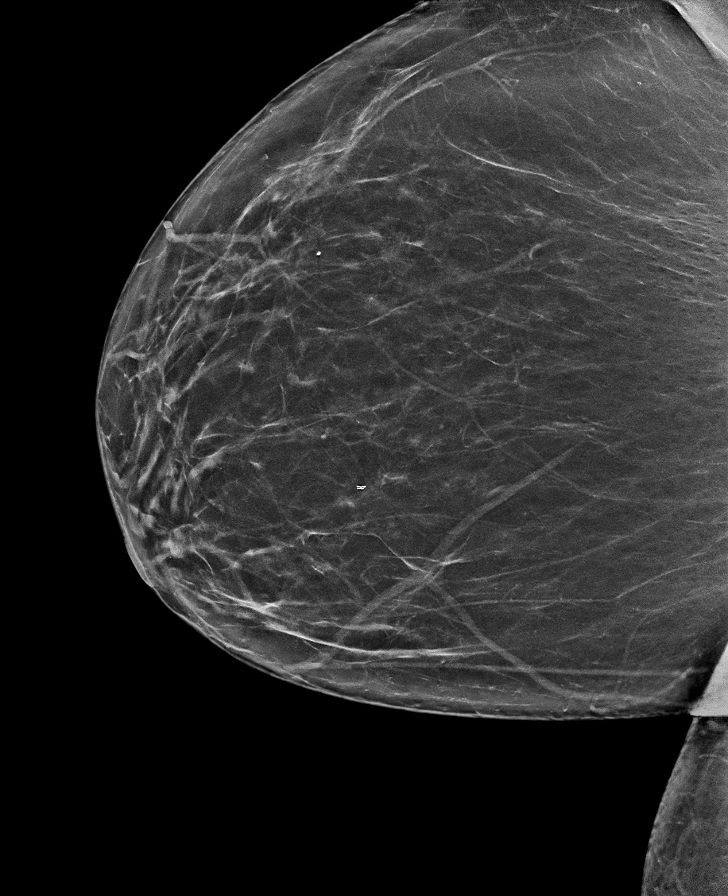

[R CC synth-2D]
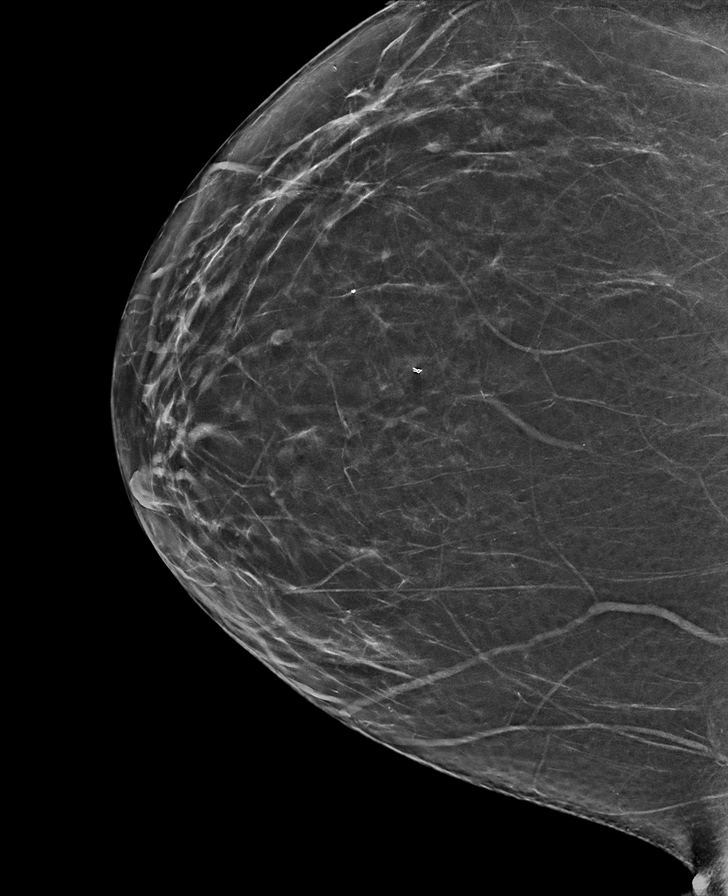

[R MLO synth-2D (2 of 2)]
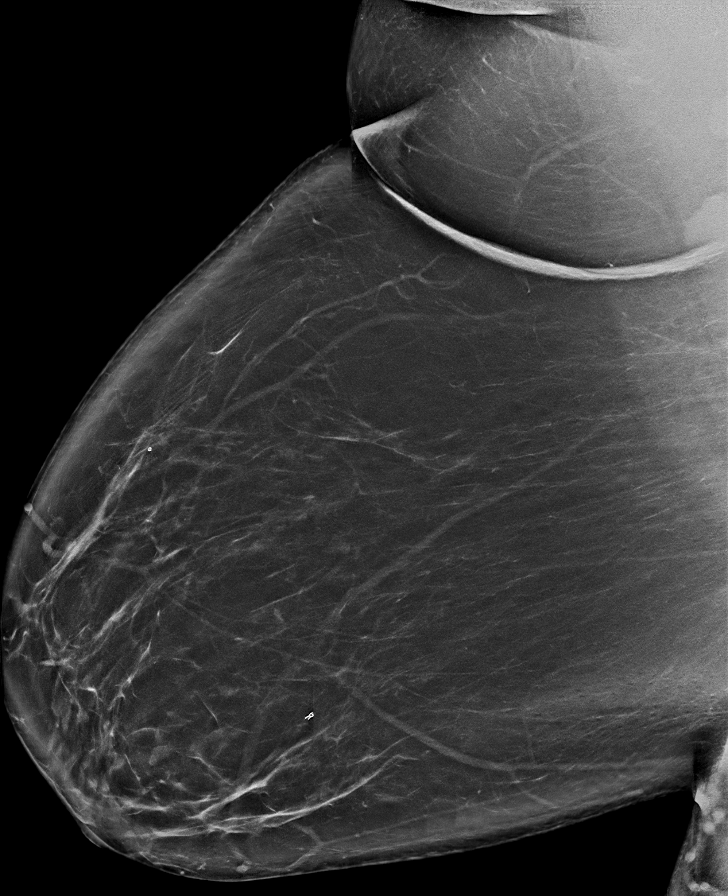

[L XCCL synth-2D]
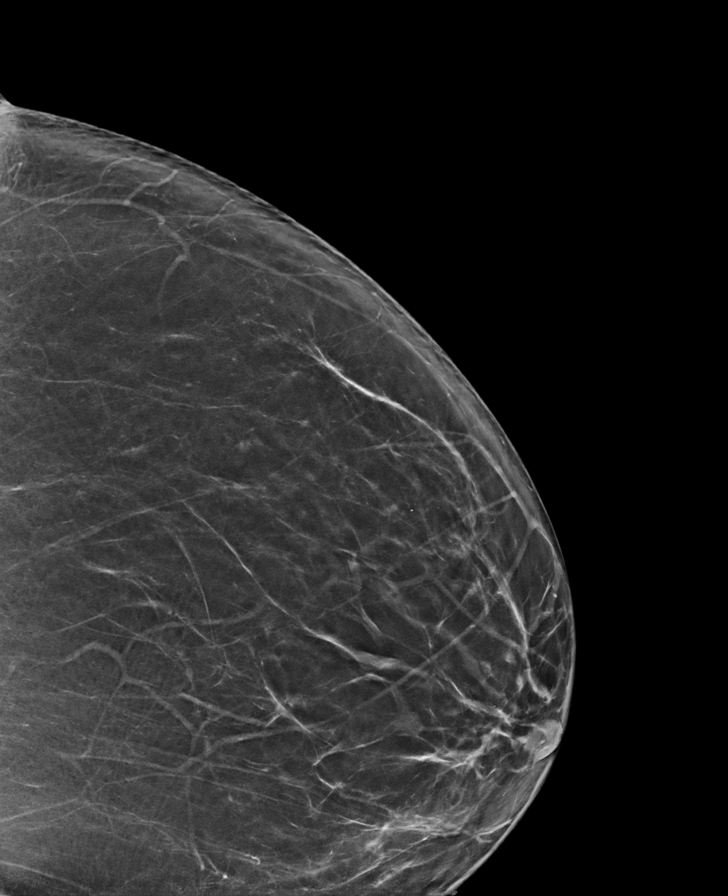

[R MLO tomo · tomo slice 56/82.0]
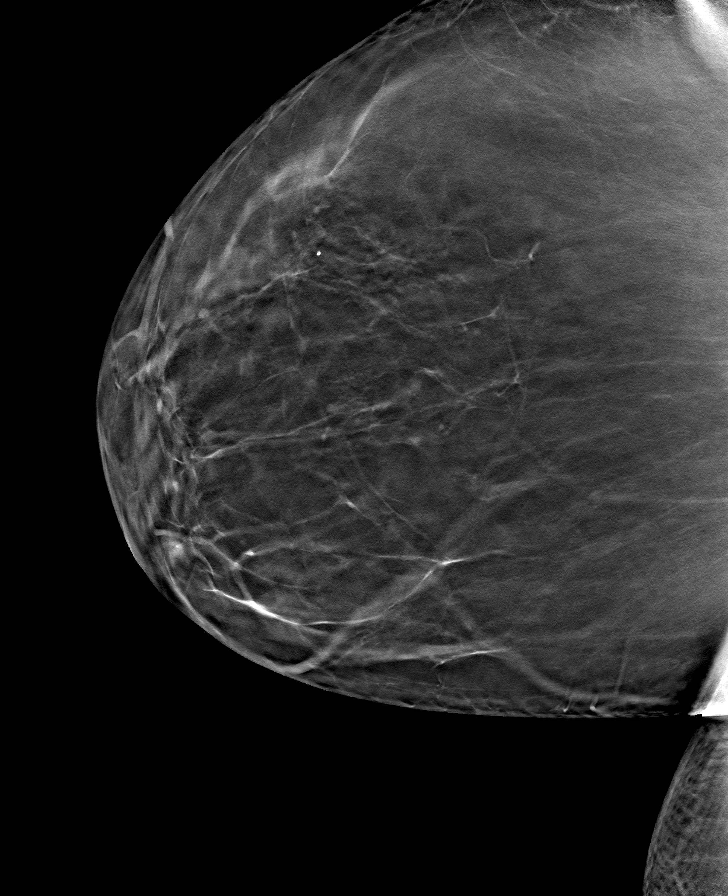

[8 of 40 positions shown; findings below may reference images not displayed]

ACR Breast Density Category b: There are scattered areas of
fibroglandular density.
FINDINGS: There are no findings suspicious for malignancy.
IMPRESSION: No mammographic evidence of malignancy. A result letter of this
screening mammogram will be mailed directly to the patient.

RECOMMENDATION:
Screening mammogram in one year. (Code:51-O-LD2)

BI-RADS CATEGORY  1: Negative.

## 2023-04-10 ENCOUNTER — Ambulatory Visit: Payer: Medicare HMO | Admitting: Family

## 2023-04-11 NOTE — Therapy (Deleted)
OUTPATIENT PHYSICAL THERAPY NOTE   Patient Name: Candice Watkins MRN: [id number] DOB:[birth year], 52 y.o., female Today's Date: 04/11/2023  END OF SESSION:      Past Medical History:  Diagnosis Date   Anxiety    Arthritis    "knees, ankles" (10/11/2015)   Asthma    Depression    Diabetes mellitus without complication (HCC)    Heartburn    HLD (hyperlipidemia)    Hypertension    Hypothyroidism    Kidney stones    Migraine    "monthly" (10/11/2015)   PONV (postoperative nausea and vomiting) X 1   Primary hyperparathyroidism (HCC)    Seasonal allergies    Sleep apnea    Stomach ulcer    Past Surgical History:  Procedure Laterality Date   BREAST BIOPSY Bilateral    neg- core   CESAREAN SECTION  2000   COLONOSCOPY WITH PROPOFOL N/A 05/02/2022   Procedure: COLONOSCOPY WITH PROPOFOL;  Surgeon: Toney Reil, MD;  Location: [facility] ENDOSCOPY;  Service: Gastroenterology;  Laterality: N/A;   COLONOSCOPY WITH PROPOFOL N/A 07/22/2022   Procedure: COLONOSCOPY WITH PROPOFOL;  Surgeon: Toney Reil, MD;  Location: [facility] ENDOSCOPY;  Service: Gastroenterology;  Laterality: N/A;   ESOPHAGOGASTRODUODENOSCOPY (EGD) WITH PROPOFOL N/A 05/02/2022   Procedure: ESOPHAGOGASTRODUODENOSCOPY (EGD) WITH PROPOFOL;  Surgeon: Toney Reil, MD;  Location: [facility] ENDOSCOPY;  Service: Gastroenterology;  Laterality: N/A;   JOINT REPLACEMENT     KNEE ARTHROSCOPY Right    THYROID SURGERY     "removed tumor"   TOTAL KNEE ARTHROPLASTY Right 10/11/2015   TOTAL KNEE ARTHROPLASTY Right 10/11/2015   Procedure: TOTAL KNEE ARTHROPLASTY;  Surgeon: Loreta Ave, MD;  Location: [facility] OR;  Service: Orthopedics;  Laterality: Right;   TUBAL LIGATION     VAGINAL HYSTERECTOMY     Patient Active Problem List   Diagnosis Date Noted   Prediabetes 12/11/2022   Mixed hyperlipidemia 12/11/2022   Essential hypertension, benign 12/11/2022   Vitamin D deficiency, unspecified 12/11/2022   B12 deficiency due  to diet 12/11/2022   History of colonic polyps 07/22/2022   Dyspepsia 05/02/2022   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   Screening for colon cancer 05/02/2022   Polyp of colon 05/02/2022   MDD (major depressive disorder), recurrent severe, without psychosis (HCC) 01/28/2017   S/P total knee replacement 10/11/2015   Osteoarthritis of knee 09/27/2013   Patellofemoral dysfunction 09/27/2013   Hypothyroidism (acquired) 09/14/2007   Allergic rhinitis 03/04/2006    PCP: Miki Kins, FNP   REFERRING PROVIDER: Sheral Apley, MD  REFERRING DIAG: Left Rotator Cuff Tendonitis  THERAPY DIAG:  No diagnosis found.  Rationale for Evaluation and Treatment: Rehabilitation  ONSET DATE: 2 months  SUBJECTIVE:  SUBJECTIVE STATEMENT:   Patient reporting good tolerance of exercises at last visit. She states that she has been practicing the aggravating motions, which has been improving. She has an appointment to f/u with ortho later today.    Hand dominance: Right  PERTINENT HISTORY: None available  PAIN:  Are you having pain? Yes: NPRS scale: (worst)10/10 Pain location: L shoulder Pain description: ache Aggravating factors: activity, lifting, sleep positions Relieving factors: rest  PRECAUTIONS: None  RED FLAGS: None   WEIGHT BEARING RESTRICTIONS: No  FALLS:  Has patient fallen in last 6 months? No  OCCUPATION: Not working  PLOF: Independent  PATIENT GOALS:To resolve my shoulder symptoms  NEXT MD VISIT:   OBJECTIVE:  Note: Objective measures were completed at Evaluation unless otherwise noted.  DIAGNOSTIC FINDINGS:  none  PATIENT SURVEYS:  FOTO 52(67 predicted)  POSTURE: Rounded depressed shoulders  UPPER EXTREMITY ROM:   A/PROM Right eval Left eval  Shoulder flexion WNL 150/WFL   Shoulder extension WNL WNL  Shoulder abduction WNL 150/WFL  Shoulder adduction    Shoulder internal rotation WNL WFL P!  Shoulder external rotation WNL WFL P!  Elbow flexion    Elbow extension    Wrist flexion    Wrist extension    Wrist ulnar deviation    Wrist radial deviation    Wrist pronation    Wrist supination    (Blank rows = not tested)  UPPER EXTREMITY MMT:  MMT Right eval Left eval  Shoulder flexion 5 4-  Shoulder extension 5 4-  Shoulder abduction 5 4-  Shoulder adduction    Shoulder internal rotation 5 4-  Shoulder external rotation 5 4-  Middle trapezius    Lower trapezius    Elbow flexion    Elbow extension    Wrist flexion    Wrist extension    Wrist ulnar deviation    Wrist radial deviation    Wrist pronation    Wrist supination    Grip strength (lbs)    (Blank rows = not tested)  SHOULDER SPECIAL TESTS: Impingement tests: Neer impingement test: positive  and Hawkins/Kennedy impingement test: positive   Rotator cuff assessment: Drop arm test: negative, Empty can test: negative, and Hornblower's sign: negative Biceps assessment: Speed's test: negative  JOINT MOBILITY TESTING:  deferred  PALPATION:  Unremarkable for symptom reproduction   TODAY'S TREATMENT:      [facility] Adult PT Treatment:                                                DATE: 04/02/2023  Therapeutic Exercise: Seated pball shoulder flexion AROM x 15  Seated scaption 1# dumbbell 15x Seated flexion 1# dumbbell 15x Seated abduction 1# dumbbell 15x Supine serratus press with 1# dumbbell 15x Supine horizontal abduction red TB 15x S/L flexion 500g ball 15x S/L ER 500g ball 15x S/L abduction 500g 15x UBE L2 fwd/50min back   [facility] Adult PT Treatment:                                                DATE: 03/25/23 Therapeutic Exercise: UBE L1 3/3 min Seated hor abduction YTB 8x  Supine hor abduction YTB 15x Supine press with protraction 1000g ball 15x Supine flexion  5000g  ball 15x S/L ER 500g ball 15x S/L abduction 500g 15x Seated scaption 500g ball 15x Standing row YTB 15x Standing extension YTB 15x Manual PNF 15x Rhythmic stabilization 90d flexion 30s x3    [facility] Adult PT Treatment:                                                DATE: 03/13/2023  Therapeutic Exercise: UBE, level 1, fwd/back x 2 min each  Shoulder rows, RTB, 2 x 10  Shoulder ext/pull downs, RTB, 2 x 10  Supine chest press, 2.5# on dowel, 2 x 15 Supine shoulder flexion, 2.5# on dowel, 2 x 5, limited by p!  Shoulder isometrics with manual resistance from clinician, 3 sec x 5 each  Supine Abduction/adduction from 90 deg flexion Supine Abduction/adduction from 20 deg flexion Supine D2 extension Seated ER/IR form 0 deg abduction                                                                                                                                        DATE: 03/05/23 Eval    PATIENT EDUCATION: Education details: Discussed eval findings, rehab rationale and POC and patient is in agreement  Person educated: Patient Education method: Explanation Education comprehension: verbalized understanding and needs further education  HOME EXERCISE PROGRAM: Access Code: [email or web address] URL: [email or web address] Date: 03/05/2023 Prepared by: Gustavus Bryant  Exercises - Standing Shoulder Scaption  - 2 x daily - 5 x weekly - 1 sets - 15 reps - Standing Shoulder Horizontal Abduction with Resistance  - 2 x daily - 5 x weekly - 2 sets - 15 reps - Shoulder External Rotation and Scapular Retraction with Resistance  - 2 x daily - 5 x weekly - 1 sets - 15 reps  ASSESSMENT:  CLINICAL IMPRESSION:    Patient is responding well to current shoulder and periscapular strengthening program. She continues to have impingement signs, however, she does have improved tolerance of OH tasks today. We will continue with current POC.   OBJECTIVE IMPAIRMENTS: decreased knowledge of condition,  decreased ROM, decreased strength, impaired perceived functional ability, impaired UE functional use, postural dysfunction, and pain.   ACTIVITY LIMITATIONS: carrying, lifting, and reach over head  PERSONAL FACTORS: Age, Time since onset of injury/illness/exacerbation, and 1 comorbidity: DM  are also affecting patient's functional outcome.   REHAB POTENTIAL: Good  CLINICAL DECISION MAKING: Stable/uncomplicated  EVALUATION COMPLEXITY: Low   GOALS: Goals reviewed with patient? No  SHORT TERM GOALS=LONG TERM GOALS: Target date: 04/16/2023  Patient to demonstrate independence in HEP  Baseline: [id number] Goal status: INITIAL  2.  Patient will score at least 67% on FOTO to signify clinically meaningful improvement in functional abilities.   Baseline: 52% Goal status: INITIAL  3.  Increase L shoulder  strength to 4/5 Baseline:  MMT Right eval Left eval  Shoulder flexion 5 4-  Shoulder extension 5 4-  Shoulder abduction 5 4-  Shoulder adduction    Shoulder internal rotation 5 4-  Shoulder external rotation 5 4-   Goal status: INITIAL  4.  Increase AROM L shoulder to 160d in flexion and abduction Baseline:  A/PROM Right eval Left eval  Shoulder flexion WNL 150/WFL  Shoulder extension WNL WNL  Shoulder abduction WNL 150/WFL  Shoulder adduction    Shoulder internal rotation WNL WFL P!  Shoulder external rotation WNL WFL P!   Goal status: INITIAL     PLAN:  PT FREQUENCY: 1x/week  PT DURATION: 6 weeks  PLANNED INTERVENTIONS: 97164- PT Re-evaluation, 97110-Therapeutic exercises, 97530- Therapeutic activity, 97112- Neuromuscular re-education, 97535- Self Care, 66440- Manual therapy, 97014- Electrical stimulation (unattended), Dry Needling, Joint mobilization, Cryotherapy, and Moist heat  PLAN FOR NEXT SESSION: HEP review and update, manual techniques as appropriate, aerobic tasks, ROM and flexibility activities, strengthening and PREs, TPDN, gait and balance training  as needed     Mauri Reading, PT, DPT   04/11/2023, 11:48 AM

## 2023-04-14 ENCOUNTER — Ambulatory Visit: Payer: Medicare HMO

## 2023-04-22 DIAGNOSIS — R32 Unspecified urinary incontinence: Secondary | ICD-10-CM | POA: Diagnosis not present

## 2023-04-22 DIAGNOSIS — M1990 Unspecified osteoarthritis, unspecified site: Secondary | ICD-10-CM | POA: Diagnosis not present

## 2023-04-22 DIAGNOSIS — Z008 Encounter for other general examination: Secondary | ICD-10-CM | POA: Diagnosis not present

## 2023-04-22 DIAGNOSIS — K219 Gastro-esophageal reflux disease without esophagitis: Secondary | ICD-10-CM | POA: Diagnosis not present

## 2023-04-22 DIAGNOSIS — R519 Headache, unspecified: Secondary | ICD-10-CM | POA: Diagnosis not present

## 2023-04-22 DIAGNOSIS — F411 Generalized anxiety disorder: Secondary | ICD-10-CM | POA: Diagnosis not present

## 2023-04-22 DIAGNOSIS — I129 Hypertensive chronic kidney disease with stage 1 through stage 4 chronic kidney disease, or unspecified chronic kidney disease: Secondary | ICD-10-CM | POA: Diagnosis not present

## 2023-04-22 DIAGNOSIS — R45851 Suicidal ideations: Secondary | ICD-10-CM | POA: Diagnosis not present

## 2023-04-22 DIAGNOSIS — J45909 Unspecified asthma, uncomplicated: Secondary | ICD-10-CM | POA: Diagnosis not present

## 2023-04-22 DIAGNOSIS — E785 Hyperlipidemia, unspecified: Secondary | ICD-10-CM | POA: Diagnosis not present

## 2023-04-22 DIAGNOSIS — N182 Chronic kidney disease, stage 2 (mild): Secondary | ICD-10-CM | POA: Diagnosis not present

## 2023-04-22 DIAGNOSIS — E039 Hypothyroidism, unspecified: Secondary | ICD-10-CM | POA: Diagnosis not present

## 2023-04-22 DIAGNOSIS — G4733 Obstructive sleep apnea (adult) (pediatric): Secondary | ICD-10-CM | POA: Diagnosis not present

## 2023-04-27 ENCOUNTER — Other Ambulatory Visit: Payer: Self-pay | Admitting: Family

## 2023-05-01 DIAGNOSIS — F339 Major depressive disorder, recurrent, unspecified: Secondary | ICD-10-CM | POA: Diagnosis not present

## 2023-05-02 DIAGNOSIS — F332 Major depressive disorder, recurrent severe without psychotic features: Secondary | ICD-10-CM | POA: Diagnosis not present

## 2023-05-11 ENCOUNTER — Other Ambulatory Visit: Payer: Self-pay | Admitting: Family

## 2023-05-11 ENCOUNTER — Other Ambulatory Visit: Payer: Self-pay | Admitting: Gastroenterology

## 2023-05-19 ENCOUNTER — Other Ambulatory Visit: Payer: Self-pay | Admitting: Family

## 2023-06-01 ENCOUNTER — Encounter: Payer: Self-pay | Admitting: Family

## 2023-06-01 NOTE — Assessment & Plan Note (Signed)
 Blood pressure well controlled with current medications.  Continue current therapy.  Will reassess at follow up.

## 2023-06-01 NOTE — Assessment & Plan Note (Signed)
 Checking labs today.  Continue current therapy for lipid control. Will modify as needed based on labwork results.

## 2023-06-01 NOTE — Assessment & Plan Note (Signed)
 Checking labs today.  Will continue supplements as needed.

## 2023-06-01 NOTE — Assessment & Plan Note (Signed)

## 2023-06-03 DIAGNOSIS — F332 Major depressive disorder, recurrent severe without psychotic features: Secondary | ICD-10-CM | POA: Diagnosis not present

## 2023-06-08 ENCOUNTER — Other Ambulatory Visit: Payer: Self-pay | Admitting: Family

## 2023-06-08 DIAGNOSIS — R7303 Prediabetes: Secondary | ICD-10-CM

## 2023-06-10 ENCOUNTER — Other Ambulatory Visit: Payer: Self-pay | Admitting: Family

## 2023-06-10 ENCOUNTER — Encounter: Payer: Medicare HMO | Admitting: Family

## 2023-06-11 ENCOUNTER — Ambulatory Visit: Payer: Medicare HMO

## 2023-06-18 ENCOUNTER — Encounter: Payer: Self-pay | Admitting: Family

## 2023-06-18 ENCOUNTER — Ambulatory Visit (INDEPENDENT_AMBULATORY_CARE_PROVIDER_SITE_OTHER): Payer: Medicare HMO | Admitting: Family

## 2023-06-18 VITALS — BP 104/76 | HR 89 | Ht 64.0 in | Wt 244.6 lb

## 2023-06-18 DIAGNOSIS — E538 Deficiency of other specified B group vitamins: Secondary | ICD-10-CM

## 2023-06-18 DIAGNOSIS — Z202 Contact with and (suspected) exposure to infections with a predominantly sexual mode of transmission: Secondary | ICD-10-CM | POA: Diagnosis not present

## 2023-06-18 DIAGNOSIS — E559 Vitamin D deficiency, unspecified: Secondary | ICD-10-CM | POA: Diagnosis not present

## 2023-06-18 DIAGNOSIS — R233 Spontaneous ecchymoses: Secondary | ICD-10-CM

## 2023-06-18 DIAGNOSIS — R7303 Prediabetes: Secondary | ICD-10-CM | POA: Diagnosis not present

## 2023-06-18 DIAGNOSIS — E782 Mixed hyperlipidemia: Secondary | ICD-10-CM

## 2023-06-18 DIAGNOSIS — Z124 Encounter for screening for malignant neoplasm of cervix: Secondary | ICD-10-CM | POA: Diagnosis not present

## 2023-06-18 DIAGNOSIS — M2550 Pain in unspecified joint: Secondary | ICD-10-CM | POA: Diagnosis not present

## 2023-06-18 DIAGNOSIS — Z0001 Encounter for general adult medical examination with abnormal findings: Secondary | ICD-10-CM | POA: Diagnosis not present

## 2023-06-18 DIAGNOSIS — Z0000 Encounter for general adult medical examination without abnormal findings: Secondary | ICD-10-CM

## 2023-06-18 DIAGNOSIS — I10 Essential (primary) hypertension: Secondary | ICD-10-CM

## 2023-06-18 MED ORDER — VALSARTAN-HYDROCHLOROTHIAZIDE 160-12.5 MG PO TABS: 1.0000 | ORAL_TABLET | Freq: Every day | ORAL | 1 refills | Status: DC

## 2023-06-18 MED ORDER — TRAZODONE HCL 50 MG PO TABS: 50.0000 mg | ORAL_TABLET | Freq: Every day | ORAL | 1 refills | Status: DC

## 2023-06-18 NOTE — Progress Notes (Signed)
Complete physical exam  Patient: Candice Watkins   DOB: 1970-10-22   53 y.o. Female  MRN: [id number]  Subjective:    Chief Complaint  Patient presents with   Annual Exam    CPE    Candice Watkins is a 53 y.o. female who presents today for a complete physical exam. She reports consuming a low fat diet. Exercise is limited by orthopedic condition(s): arthritis in knees and hips. She generally feels fairly well. She reports sleeping poorly. She does have additional problems to discuss today.   She has been noticing increased bruising. Due for pap and breast exam.   Ortho has her going to PT for her hip pain, but she is having severe pain in her hip and thinks she's going to need a hip replacement eventually.    Most recent depression screenings:    09/10/2022    4:08 PM  PHQ 2/9 Scores  PHQ - 2 Score 3  PHQ- 9 Score 13      Past Medical History:  Diagnosis Date   Anxiety    Arthritis    "knees, ankles" (10/11/2015)   Asthma    Depression    Diabetes mellitus without complication (HCC)    Heartburn    HLD (hyperlipidemia)    Hypertension    Hypothyroidism    Kidney stones    Migraine    "monthly" (10/11/2015)   PONV (postoperative nausea and vomiting) X 1   Primary hyperparathyroidism (HCC)    Seasonal allergies    Sleep apnea    Stomach ulcer     Past Surgical History:  Procedure Laterality Date   BREAST BIOPSY Bilateral    neg- core   CESAREAN SECTION  2000   COLONOSCOPY WITH PROPOFOL N/A 05/02/2022   Procedure: COLONOSCOPY WITH PROPOFOL;  Surgeon: Toney Reil, MD;  Location: [facility] ENDOSCOPY;  Service: Gastroenterology;  Laterality: N/A;   COLONOSCOPY WITH PROPOFOL N/A 07/22/2022   Procedure: COLONOSCOPY WITH PROPOFOL;  Surgeon: Toney Reil, MD;  Location: [facility] ENDOSCOPY;  Service: Gastroenterology;  Laterality: N/A;   ESOPHAGOGASTRODUODENOSCOPY (EGD) WITH PROPOFOL N/A 05/02/2022   Procedure: ESOPHAGOGASTRODUODENOSCOPY (EGD) WITH  PROPOFOL;  Surgeon: Toney Reil, MD;  Location: [facility] ENDOSCOPY;  Service: Gastroenterology;  Laterality: N/A;   JOINT REPLACEMENT     KNEE ARTHROSCOPY Right    THYROID SURGERY     "removed tumor"   TOTAL KNEE ARTHROPLASTY Right 10/11/2015   TOTAL KNEE ARTHROPLASTY Right 10/11/2015   Procedure: TOTAL KNEE ARTHROPLASTY;  Surgeon: Loreta Ave, MD;  Location: [facility] OR;  Service: Orthopedics;  Laterality: Right;   TUBAL LIGATION     VAGINAL HYSTERECTOMY      Family History  Problem Relation Age of Onset   Breast cancer Neg Hx    Kidney disease Neg Hx     Social History   Socioeconomic History   Marital status: Single    Spouse name: Not on file   Number of children: Not on file   Years of education: Not on file   Highest education level: Not on file  Occupational History   Not on file  Tobacco Use   Smoking status: Never   Smokeless tobacco: Never  Vaping Use   Vaping status: Never Used  Substance and Sexual Activity   Alcohol use: Yes    Alcohol/week: 2.0 standard drinks of alcohol    Types: 1 Glasses of wine, 1 Shots of liquor per week   Drug use: No   Sexual activity: Yes  Birth control/protection: None    Comment: hysterectomy  Other Topics Concern   Not on file  Social History Narrative   Not on file   Social Drivers of Health   Financial Resource Strain: Not on file  Food Insecurity: Not on file  Transportation Needs: Not on file  Physical Activity: Not on file  Stress: Not on file  Social Connections: Not on file  Intimate Partner Violence: Not on file    Outpatient Medications Prior to Visit  Medication Sig   Azelastine HCl 137 MCG/SPRAY SOLN SPRAY 1 SPRAY INTO EACH NOSTRIL TWICE A DAY   budesonide-formoterol (SYMBICORT) 80-4.5 MCG/ACT inhaler TAKE 2 PUFFS BY MOUTH TWICE A DAY   clonazePAM (KLONOPIN) 0.5 MG tablet TAKE 1/2 - 1 TABLET (0.25 - 0.5 MG TOTAL) BY MOUTH TWICE A DAY AS NEEDED FOR ANXIETY   hydrOXYzine (VISTARIL) 25 MG capsule ONCE A  DAY AS NEEDED FOR ANXIETY   ibuprofen (ADVIL) 800 MG tablet Take 1 tablet (800 mg total) by mouth every 8 (eight) hours as needed.   levothyroxine (SYNTHROID) 112 MCG tablet TAKE 1 TABLET BY MOUTH EVERY MORNING AT LEAST 30 MINUTES BEFORE FOOD OR OTHER MEDS.   metoprolol succinate (TOPROL-XL) 50 MG 24 hr tablet Take 1 tablet (50 mg total) by mouth daily.   rosuvastatin (CRESTOR) 10 MG tablet TAKE 1 TABLET BY MOUTH EVERY DAY   tirzepatide (MOUNJARO) 7.5 MG/0.5ML Pen INJECT 7.5 MG SUBCUTANEOUSLY WEEKLY   TRINTELLIX 20 MG TABS tablet Take 20 mg by mouth daily.   [DISCONTINUED] valsartan-hydrochlorothiazide (DIOVAN-HCT) 160-25 MG tablet TAKE 1 TABLET BY MOUTH EVERY DAY   busPIRone (BUSPAR) 5 MG tablet TAKE 1 TABLET BY MOUTH TWICE A DAY (Patient not taking: Reported on 06/18/2023)   cetirizine (ZYRTEC) 10 MG chewable tablet Chew 10 mg by mouth at bedtime. (Patient not taking: Reported on 06/18/2023)   omeprazole (PRILOSEC) 40 MG capsule TAKE 1 CAPSULE BY MOUTH EVERY DAY BEFORE BREAKFAST (Patient not taking: Reported on 06/18/2023)   ondansetron (ZOFRAN-ODT) 4 MG disintegrating tablet TAKE 1 TABLET BY MOUTH EVERY 8 HOURS AS NEEDED FOR NAUSEA (Patient not taking: Reported on 06/18/2023)   [DISCONTINUED] lactulose (CHRONULAC) 10 GM/15ML solution TAKE 30 MLS (20 G TOTAL) BY MOUTH DAILY AS NEEDED FOR MODERATE CONSTIPATION OR SEVERE CONSTIPATION. (Patient not taking: Reported on 06/18/2023)   [DISCONTINUED] pantoprazole (PROTONIX) 40 MG tablet TAKE 1 TABLET BY MOUTH EVERY DAY (Patient not taking: Reported on 06/18/2023)   No facility-administered medications prior to visit.    Review of Systems  All other systems reviewed and are negative.       Objective:     BP 104/76   Pulse 89   Ht 5\' 4"  (1.626 m)   Wt 244 lb 9.6 oz (110.9 kg)   SpO2 98%   BMI 41.99 kg/m    Physical Exam Vitals and nursing note reviewed.  Constitutional:      Appearance: Normal appearance. She is normal weight.  HENT:      Head: Normocephalic.  Eyes:     Extraocular Movements: Extraocular movements intact.     Conjunctiva/sclera: Conjunctivae normal.     Pupils: Pupils are equal, round, and reactive to light.  Cardiovascular:     Rate and Rhythm: Normal rate.  Pulmonary:     Effort: Pulmonary effort is normal.  Neurological:     General: No focal deficit present.     Mental Status: She is alert and oriented to person, place, and time. Mental status is at baseline.  Psychiatric:        Mood and Affect: Mood normal.        Behavior: Behavior normal.        Thought Content: Thought content normal.      No results found for any visits on 06/18/23.  Recent Results (from the past 2160 hours)  Iron, TIBC and Ferritin Panel     Status: None   Collection Time: 03/25/23  3:32 PM  Result Value Ref Range   Total Iron Binding Capacity 353 250 - 450 ug/dL   UIBC 401 027 - 253 ug/dL   Iron 63 27 - 664 ug/dL   Iron Saturation 18 15 - 55 %   Ferritin 84 15 - 150 ng/mL  CBC with Diff     Status: None   Collection Time: 03/25/23  3:32 PM  Result Value Ref Range   WBC 6.4 3.4 - 10.8 x10E3/uL   RBC 4.11 3.77 - 5.28 x10E6/uL   Hemoglobin 12.0 11.1 - 15.9 g/dL   Hematocrit 40.3 47.4 - 46.6 %   MCV 92 79 - 97 fL   MCH 29.2 26.6 - 33.0 pg   MCHC 31.9 31.5 - 35.7 g/dL   RDW 25.9 56.3 - 87.5 %   Platelets 286 150 - 450 x10E3/uL   Neutrophils 60 Not Estab. %   Lymphs 28 Not Estab. %   Monocytes 5 Not Estab. %   Eos 5 Not Estab. %   Basos 1 Not Estab. %   Neutrophils Absolute 3.9 1.4 - 7.0 x10E3/uL   Lymphocytes Absolute 1.8 0.7 - 3.1 x10E3/uL   Monocytes Absolute 0.3 0.1 - 0.9 x10E3/uL   EOS (ABSOLUTE) 0.3 0.0 - 0.4 x10E3/uL   Basophils Absolute 0.1 0.0 - 0.2 x10E3/uL   Immature Granulocytes 1 Not Estab. %   Immature Grans (Abs) 0.0 0.0 - 0.1 x10E3/uL  Lipid panel     Status: None   Collection Time: 03/25/23  3:32 PM  Result Value Ref Range   Cholesterol, Total 171 100 - 199 mg/dL   Triglycerides 76 0 -  149 mg/dL   HDL 66 >64 mg/dL   VLDL Cholesterol Cal 14 5 - 40 mg/dL   LDL Chol Calc (NIH) 91 0 - 99 mg/dL   Chol/HDL Ratio 2.6 0.0 - 4.4 ratio    Comment:                                   T. Chol/HDL Ratio                                             Men  Women                               1/2 Avg.Risk  3.4    3.3                                   Avg.Risk  5.0    4.4                                2X Avg.Risk  9.6    7.1  3X Avg.Risk 23.4   11.0   VITAMIN D 25 Hydroxy (Vit-D Deficiency, Fractures)     Status: None   Collection Time: 03/25/23  3:32 PM  Result Value Ref Range   Vit D, 25-Hydroxy 47.7 30.0 - 100.0 ng/mL    Comment: Vitamin D deficiency has been defined by the Institute of Medicine and an Endocrine Society practice guideline as a level of serum 25-OH vitamin D less than 20 ng/mL (1,2). The Endocrine Society went on to further define vitamin D insufficiency as a level between 21 and 29 ng/mL (2). 1. IOM (Institute of Medicine). 2010. Dietary reference    intakes for calcium and D. [place]: The    [employer]. 2. Holick MF, Binkley [place], Bischoff-Ferrari HA, et al.    Evaluation, treatment, and prevention of vitamin D    deficiency: an Endocrine Society clinical practice    guideline. JCEM. 2011 Jul; 96(7):1911-30.   CMP14+EGFR     Status: Abnormal   Collection Time: 03/25/23  3:32 PM  Result Value Ref Range   Glucose 83 70 - 99 mg/dL   BUN 11 6 - 24 mg/dL   Creatinine, Ser 0.98 (H) 0.57 - 1.00 mg/dL   eGFR 62 >11 BJ/YNW/2.95   BUN/Creatinine Ratio 10 9 - 23   Sodium 138 134 - 144 mmol/L   Potassium 3.8 3.5 - 5.2 mmol/L   Chloride 100 96 - 106 mmol/L   CO2 24 20 - 29 mmol/L   Calcium 10.0 8.7 - 10.2 mg/dL   Total Protein 7.5 6.0 - 8.5 g/dL   Albumin 4.2 3.8 - 4.9 g/dL   Globulin, Total 3.3 1.5 - 4.5 g/dL   Bilirubin Total 0.9 0.0 - 1.2 mg/dL   Alkaline Phosphatase 78 44 - 121 IU/L   AST 17 0 - 40 IU/L   ALT 8  0 - 32 IU/L  Hemoglobin A1c     Status: None   Collection Time: 03/25/23  3:32 PM  Result Value Ref Range   Hgb A1c MFr Bld 5.5 4.8 - 5.6 %    Comment:          Prediabetes: 5.7 - 6.4          Diabetes: >6.4          Glycemic control for adults with diabetes: <7.0    Est. average glucose Bld gHb Est-mCnc 111 mg/dL  Vitamin A21     Status: None   Collection Time: 03/25/23  3:32 PM  Result Value Ref Range   Vitamin B-12 958 232 - 1,245 pg/mL  TSH+T4F+T3Free     Status: None   Collection Time: 03/25/23  3:32 PM  Result Value Ref Range   TSH 4.100 0.450 - 4.500 uIU/mL   T3, Free 2.1 2.0 - 4.4 pg/mL   Free T4 1.53 0.82 - 1.77 ng/dL        Assessment & Plan:    Routine Health Maintenance and Physical Exam  Immunization History  Administered Date(s) Administered   Influenza, Seasonal, Injecte, Preservative Fre 04/06/2007, 02/24/2008, 03/31/2009   PPD Test 10/27/2007, 01/19/2008, 10/25/2008, 10/18/2009    Health Maintenance  Topic Date Due   Medicare Annual Wellness (AWV)  Never done   Pneumococcal Vaccine 68-21 Years old (1 of 2 - PCV) Never done   HIV Screening  Never done   Hepatitis C Screening  Never done   DTaP/Tdap/Td (1 - Tdap) Never done   Cervical Cancer Screening (HPV/Pap Cotest)  07/21/2020   Zoster Vaccines-  Shingrix (1 of 2) Never done   INFLUENZA VACCINE  12/19/2022   COVID-19 Vaccine (1 - 2024-25 season) Never done   MAMMOGRAM  02/26/2025   Colonoscopy  07/21/2029   HPV VACCINES  Aged Out    Discussed health benefits of physical activity, and encouraged her to engage in regular exercise appropriate for her age and condition.  Problem List Items Addressed This Visit       Cardiovascular and Mediastinum   Essential hypertension, benign   Relevant Medications   valsartan-hydrochlorothiazide (DIOVAN HCT) 160-12.5 MG tablet   Other Relevant Orders   CMP14+EGFR   Hemoglobin A1c   CBC with Diff     Other   Prediabetes   Relevant Orders   Lipid panel    VITAMIN D 25 Hydroxy (Vit-D Deficiency, Fractures)   CMP14+EGFR   TSH   Hemoglobin A1c   Vitamin B12   CBC with Diff   Mixed hyperlipidemia   Relevant Medications   valsartan-hydrochlorothiazide (DIOVAN HCT) 160-12.5 MG tablet   Other Relevant Orders   Lipid panel   CMP14+EGFR   Hemoglobin A1c   CBC with Diff   Vitamin D deficiency, unspecified   Relevant Orders   VITAMIN D 25 Hydroxy (Vit-D Deficiency, Fractures)   CMP14+EGFR   Hemoglobin A1c   CBC with Diff   B12 deficiency due to diet   Relevant Orders   CMP14+EGFR   Hemoglobin A1c   Vitamin B12   CBC with Diff   Obesity, morbid (HCC)   Relevant Orders   CMP14+EGFR   Hemoglobin A1c   CBC with Diff   Other Visit Diagnoses       Encounter for general adult medical examination without abnormal findings    -  Primary     Abnormal bruising       Relevant Orders   Rheumatoid Arthritis Profile   ANA 12 Profile, Do All RDL   CMP14+EGFR   Hemoglobin A1c   CBC with Diff     Polyarthralgia       Relevant Orders   Rheumatoid Arthritis Profile   ANA 12 Profile, Do All RDL   CMP14+EGFR   Hemoglobin A1c   CBC with Diff     Encounter for Papanicolaou smear for cervical cancer screening       Relevant Orders   IGP,CtNgTv,Apt HPV,rfx16/18,45     Contact with and (suspected) exposure to infections with a predominantly sexual mode of transmission       Relevant Orders   IGP,CtNgTv,Apt HPV,rfx16/18,45     Patient will come in next week to have labs drawn.  Will call her with results.    Return in about 4 months (around 10/16/2023) for F/U.    Miki Kins, FNP  06/18/2023   This document may have been prepared by [facility] Voice Recognition software and as such may include unintentional dictation errors.

## 2023-06-23 ENCOUNTER — Encounter: Payer: Medicare HMO | Admitting: Family

## 2023-06-24 ENCOUNTER — Other Ambulatory Visit: Payer: Self-pay | Admitting: Family

## 2023-06-24 ENCOUNTER — Ambulatory Visit: Payer: Medicare HMO | Attending: Orthopedic Surgery

## 2023-06-24 ENCOUNTER — Other Ambulatory Visit: Payer: Self-pay

## 2023-06-24 DIAGNOSIS — R2689 Other abnormalities of gait and mobility: Secondary | ICD-10-CM | POA: Insufficient documentation

## 2023-06-24 DIAGNOSIS — M25552 Pain in left hip: Secondary | ICD-10-CM | POA: Diagnosis present

## 2023-06-24 DIAGNOSIS — M778 Other enthesopathies, not elsewhere classified: Secondary | ICD-10-CM | POA: Insufficient documentation

## 2023-06-24 DIAGNOSIS — M6281 Muscle weakness (generalized): Secondary | ICD-10-CM | POA: Diagnosis present

## 2023-06-24 NOTE — Therapy (Signed)
 OUTPATIENT PHYSICAL THERAPY EVALUATION   Patient Name: Candice Watkins MRN: [id number] DOB:Mar 15, 1971, 53 y.o., female Today's Date: 06/24/2023  END OF SESSION:  PT End of Session - 06/24/23 1531     Visit Number 1    Number of Visits 9    Date for PT Re-Evaluation 07/22/23    Authorization Type [employer] MCR    PT Start Time 1531    PT Stop Time 1613    PT Time Calculation (min) 42 min    Activity Tolerance Patient limited by pain    Behavior During Therapy [facility] for tasks assessed/performed             Past Medical History:  Diagnosis Date   Anxiety    Arthritis    knees, ankles (10/11/2015)   Asthma    Depression    Diabetes mellitus without complication (HCC)    Heartburn    HLD (hyperlipidemia)    Hypertension    Hypothyroidism    Kidney stones    Migraine    monthly (10/11/2015)   PONV (postoperative nausea and vomiting) X 1   Primary hyperparathyroidism (HCC)    Seasonal allergies    Sleep apnea    Stomach ulcer    Past Surgical History:  Procedure Laterality Date   BREAST BIOPSY Bilateral    neg- core   CESAREAN SECTION  2000   COLONOSCOPY WITH PROPOFOL  N/A 05/02/2022   Procedure: COLONOSCOPY WITH PROPOFOL ;  Surgeon: Unk Corinn Skiff, MD;  Location: [facility] ENDOSCOPY;  Service: Gastroenterology;  Laterality: N/A;   COLONOSCOPY WITH PROPOFOL  N/A 07/22/2022   Procedure: COLONOSCOPY WITH PROPOFOL ;  Surgeon: Unk Corinn Skiff, MD;  Location: [facility] ENDOSCOPY;  Service: Gastroenterology;  Laterality: N/A;   ESOPHAGOGASTRODUODENOSCOPY (EGD) WITH PROPOFOL  N/A 05/02/2022   Procedure: ESOPHAGOGASTRODUODENOSCOPY (EGD) WITH PROPOFOL ;  Surgeon: Unk Corinn Skiff, MD;  Location: [facility] ENDOSCOPY;  Service: Gastroenterology;  Laterality: N/A;   JOINT REPLACEMENT     KNEE ARTHROSCOPY Right    THYROID  SURGERY     removed tumor   TOTAL KNEE ARTHROPLASTY Right 10/11/2015   TOTAL KNEE ARTHROPLASTY Right 10/11/2015   Procedure: TOTAL KNEE ARTHROPLASTY;  Surgeon:  Toribio JULIANNA Chancy, MD;  Location: [facility] OR;  Service: Orthopedics;  Laterality: Right;   TUBAL LIGATION     VAGINAL HYSTERECTOMY     Patient Active Problem List   Diagnosis Date Noted   Obesity, morbid (HCC) 06/18/2023   Prediabetes 12/11/2022   Mixed hyperlipidemia 12/11/2022   Essential hypertension, benign 12/11/2022   Vitamin D  deficiency, unspecified 12/11/2022   B12 deficiency due to diet 12/11/2022   History of colonic polyps 07/22/2022   Dyspepsia 05/02/2022   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   Screening for colon cancer 05/02/2022   Polyp of colon 05/02/2022   MDD (major depressive disorder), recurrent severe, without psychosis (HCC) 01/28/2017   S/P total knee replacement 10/11/2015   Osteoarthritis of knee 09/27/2013   Patellofemoral dysfunction 09/27/2013   Hypothyroidism (acquired) 09/14/2007   Allergic rhinitis 03/04/2006    PCP: Orlean Alan HERO, FNP   REFERRING PROVIDER:   Chancy Evalene BIRCH, MD    REFERRING DIAG: Left hip OA  THERAPY DIAG:  Pain in left hip  Muscle weakness (generalized)  Other abnormalities of gait and mobility  Rationale for Evaluation and Treatment: Rehabilitation  ONSET DATE: 02/03/2023  SUBJECTIVE:   SUBJECTIVE STATEMENT: Patient reports to PT with left hip that began hurting over the past couple of weeks. She states that [place]  in [place]24. She lost 65 pounds last year d/t need for left TKR, but feels significantly more limited by left hip pain at this time. She reports pain and difficulty with sitting on low surfaces like toilet, performing bed mobility, standing for a long time, and getting into her SUV.   PERTINENT HISTORY: Relevant PMHx includes right TKR, anxiety, arthritis, depression, diabetes, HTN, hypothyroidism, migraines, hyperparathyroidism, sleep apnea, obesity, B12 deficiency  PAIN:  Are you having pain?  Yes: NPRS scale: 10/10 Pain location: Left hip  Pain description: constant ache   Aggravating factors: walking, sitting down on toilet  Relieving factors: heating pad  PRECAUTIONS: Fall  RED FLAGS: None   WEIGHT BEARING RESTRICTIONS: No  FALLS:  Has patient fallen in last 6 months? Yes. Number of falls 2x after left knee buckled/slipped & fall   LIVING ENVIRONMENT: Lives with: lives with their family Lives in: House/apartment Stairs: No Has following equipment at home: Single point cane and Grab bars  OCCUPATION: n/a, disability   PLOF: Independent  PATIENT GOALS: To better tolerate my pain, and have some mobility   NEXT MD VISIT: 06/25/2023  OBJECTIVE:  Note: Objective measures were completed at Evaluation unless otherwise noted.  DIAGNOSTIC FINDINGS:   no relevant imaging results available in epic; none included in referral     PATIENT SURVEYS:  LEFS 8/80  COGNITION: Overall cognitive status: Within functional limits for tasks assessed     SENSATION: Not tested  POSTURE: flexed trunk   PALPATION: Moderate-to-severe tenderness to palpation along L hip abductor and distal IT band   LOWER EXTREMITY ROM:  Passive ROM Right eval Left eval  Hip flexion  100  Hip extension    Hip abduction  30  Hip adduction    Hip internal rotation  40  Hip external rotation  10  Knee flexion    Knee extension    Ankle dorsiflexion    Ankle plantarflexion    Ankle inversion    Ankle eversion     (Blank rows = not tested)  LOWER EXTREMITY MMT:  MMT Right eval Left eval  Hip flexion 4 2+, p!  Hip extension    Hip abduction 4+ 4  Hip adduction 4+ 4  Hip internal rotation    Hip external rotation    Knee flexion 5 4+  Knee extension 5 5  Ankle dorsiflexion    Ankle plantarflexion    Ankle inversion    Ankle eversion     (Blank rows = not tested)  LOWER EXTREMITY SPECIAL TESTS:  Hip special tests: Belvie (FABER) test: positive  and Ober's test: positive   GAIT: Distance walked: 30 feet, from lobby to treatment room  Assistive  device utilized: None Level of assistance: Complete Independence Comments: significant antalgic gait pattern with right hip hike and decreased left stance time  TREATMENT DATE:   [facility] Adult PT Treatment:                                                DATE: 06/24/2023   Initial evaluation: see patient education and home exercise program as noted below      PATIENT EDUCATION:  Education details: reviewed initial home exercise program; discussion of POC, prognosis and goals for skilled PT  Person educated: Patient Education method: Explanation, Demonstration, and Handouts Education comprehension: verbalized understanding, returned demonstration, and needs further education  HOME EXERCISE PROGRAM: Access Code: [email or web address] URL: [email or web address] Date: 06/24/2023 Prepared by: Marko Molt  Exercises - Hooklying Isometric Hip Abduction with Belt  - 2 x daily - 7 x weekly - 2 sets - 10 reps - 3 sec hold - Sidelying ITB Stretch off Table  - 2 x daily - 7 x weekly - 2 sets - 20-30 sec hold - Seated March  - 2 x daily - 7 x weekly - 2 sets - 10 reps  ASSESSMENT:  CLINICAL IMPRESSION: Candice Watkins is a 53 y.o. female who was seen today for physical therapy evaluation and treatment for persistent left hip pain with referring diagnosis of left hip OA. She is demonstrating significant deficits of left hip AROM, left LE strength, and altered gait mechanics. She has related pain and difficulty with daily activities including walking, squatting, heavy lifting, household chores, stair climbing, and bed mobility tasks. She requires skilled PT services at this time to address relevant deficits and improve overall function.     OBJECTIVE IMPAIRMENTS: Abnormal gait, decreased activity tolerance, decreased balance, decreased mobility, decreased strength, and pain.    ACTIVITY LIMITATIONS: carrying, lifting, bending, sitting, standing, squatting, sleeping, stairs, transfers, bed mobility, dressing, hygiene/grooming, and locomotion level  PARTICIPATION LIMITATIONS: meal prep, cleaning, laundry, driving, shopping, and community activity  PERSONAL FACTORS: Past/current experiences, Time since onset of injury/illness/exacerbation, and 3+ comorbidities: Relevant PMHx includes anxiety, arthritis, depression, diabetes, HTN, hypothyroidism, migraines, hyperparathyroidism, sleep apnea, obesity, B12 deficiency  are also affecting patient's functional outcome.   REHAB POTENTIAL: Fair    CLINICAL DECISION MAKING: Evolving/moderate complexity  EVALUATION COMPLEXITY: Moderate   GOALS: Goals reviewed with patient? Yes  SHORT TERM GOALS = LONG TERM GOALS: Target date: 07/22/2023   Patient will report improved overall functional ability with ODI score of 30/80 or greater.  Baseline: 8/80 Goal status: INITIAL  2.  Patient will report improved tolerance of toilet transfers, with possible raised seat height.  Baseline: up to 10/10 pain, moderate-to-severe difficulty  Goal status: INITIAL  3.  Patient will report ability to get into and out of her normal vehicle with no more than 6/10 pain.  Baseline: up to 10/10 pain, moderate-to-severe difficulty  Goal status: INITIAL  4.  Patient will demonstrate improved Left hip flexion strength to at least 4-/5 MMT.  Baseline: see objective measures Goal status: INITIAL  5.  Patient will demonstrate ability to perform floor to waist lifting of at least 10# using appropriate body mechanics and with no more than minimal pain in order to safely perform normal daily/occupational tasks.  Baseline: unable  Goal status: INITIAL  6.  Patient will be independent with initial home program for hip mobility, strength and pain modulation.  Baseline: provided at eval Goal status: INITIAL   PLAN:  PT FREQUENCY: 1-2x/week  PT  DURATION: 4 weeks  PLANNED INTERVENTIONS: 97164- PT Re-evaluation, 97110-Therapeutic exercises, 97530- Therapeutic activity, [id number]- Neuromuscular re-education, 97535- Self Care, [id number]- Manual therapy, [id number]- Aquatic Therapy, 97014- Electrical stimulation (unattended), [id number]- Electrical stimulation (manual), Patient/Family education, Taping, Dry Needling, Joint mobilization, Cryotherapy, and Moist heat  PLAN FOR NEXT SESSION: pain modulation activities via modalities, manual therapy, hip distraction and aerobic activities as tolerated. ROM activities, isometric hip strengthening   Marko Molt, PT, DPT  06/25/2023 10:56 AM

## 2023-06-25 ENCOUNTER — Other Ambulatory Visit: Payer: Medicare HMO

## 2023-06-25 DIAGNOSIS — R233 Spontaneous ecchymoses: Secondary | ICD-10-CM | POA: Diagnosis not present

## 2023-06-25 DIAGNOSIS — M2550 Pain in unspecified joint: Secondary | ICD-10-CM | POA: Diagnosis not present

## 2023-06-25 DIAGNOSIS — M25552 Pain in left hip: Secondary | ICD-10-CM | POA: Diagnosis not present

## 2023-06-25 DIAGNOSIS — E782 Mixed hyperlipidemia: Secondary | ICD-10-CM | POA: Diagnosis not present

## 2023-06-25 DIAGNOSIS — I10 Essential (primary) hypertension: Secondary | ICD-10-CM | POA: Diagnosis not present

## 2023-06-25 DIAGNOSIS — M25562 Pain in left knee: Secondary | ICD-10-CM | POA: Diagnosis not present

## 2023-06-25 DIAGNOSIS — E559 Vitamin D deficiency, unspecified: Secondary | ICD-10-CM | POA: Diagnosis not present

## 2023-06-25 DIAGNOSIS — E538 Deficiency of other specified B group vitamins: Secondary | ICD-10-CM | POA: Diagnosis not present

## 2023-06-25 DIAGNOSIS — R7303 Prediabetes: Secondary | ICD-10-CM | POA: Diagnosis not present

## 2023-06-25 DIAGNOSIS — F332 Major depressive disorder, recurrent severe without psychotic features: Secondary | ICD-10-CM | POA: Diagnosis not present

## 2023-06-25 DIAGNOSIS — R5383 Other fatigue: Secondary | ICD-10-CM | POA: Diagnosis not present

## 2023-06-25 LAB — IGP,CTNGTV,APT HPV,RFX16/18,45
Chlamydia, Nuc. Acid Amp: NEGATIVE
Gonococcus, Nuc. Acid Amp: NEGATIVE
HPV Aptima: NEGATIVE
PAP Smear Comment: 0
Trich vag by NAA: NEGATIVE

## 2023-06-25 LAB — SPECIMEN STATUS REPORT

## 2023-06-26 ENCOUNTER — Other Ambulatory Visit: Payer: Self-pay | Admitting: Family

## 2023-06-26 LAB — VITAMIN D 25 HYDROXY (VIT D DEFICIENCY, FRACTURES): Vit D, 25-Hydroxy: 41.9 ng/mL (ref 30.0–100.0)

## 2023-06-26 LAB — CMP14+EGFR
ALT: 7 [IU]/L (ref 0–32)
AST: 16 [IU]/L (ref 0–40)
Albumin: 4.1 g/dL (ref 3.8–4.9)
Alkaline Phosphatase: 77 [IU]/L (ref 44–121)
BUN/Creatinine Ratio: 13 (ref 9–23)
BUN: 15 mg/dL (ref 6–24)
Bilirubin Total: 0.9 mg/dL (ref 0.0–1.2)
CO2: 24 mmol/L (ref 20–29)
Calcium: 9.8 mg/dL (ref 8.7–10.2)
Chloride: 98 mmol/L (ref 96–106)
Creatinine, Ser: 1.19 mg/dL — ABNORMAL HIGH (ref 0.57–1.00)
Globulin, Total: 2.9 g/dL (ref 1.5–4.5)
Glucose: 79 mg/dL (ref 70–99)
Potassium: 3.9 mmol/L (ref 3.5–5.2)
Sodium: 138 mmol/L (ref 134–144)
Total Protein: 7 g/dL (ref 6.0–8.5)
eGFR: 55 mL/min/{1.73_m2} — ABNORMAL LOW (ref >59)

## 2023-06-26 LAB — CBC WITH DIFFERENTIAL/PLATELET
Basophils Absolute: 0 10*3/uL (ref 0.0–0.2)
Basos: 1 %
EOS (ABSOLUTE): 0.3 10*3/uL (ref 0.0–0.4)
Eos: 4 %
Hematocrit: 36.5 % (ref 34.0–46.6)
Hemoglobin: 12 g/dL (ref 11.1–15.9)
Immature Grans (Abs): 0 10*3/uL (ref 0.0–0.1)
Immature Granulocytes: 0 %
Lymphocytes Absolute: 1.6 10*3/uL (ref 0.7–3.1)
Lymphs: 23 %
MCH: 29.5 pg (ref 26.6–33.0)
MCHC: 32.9 g/dL (ref 31.5–35.7)
MCV: 90 fL (ref 79–97)
Monocytes Absolute: 0.4 10*3/uL (ref 0.1–0.9)
Monocytes: 6 %
Neutrophils Absolute: 4.7 10*3/uL (ref 1.4–7.0)
Neutrophils: 66 %
Platelets: 289 10*3/uL (ref 150–450)
RBC: 4.07 x10E6/uL (ref 3.77–5.28)
RDW: 12.8 % (ref 11.7–15.4)
WBC: 7 10*3/uL (ref 3.4–10.8)

## 2023-06-26 LAB — LIPID PANEL
Chol/HDL Ratio: 2.6 {ratio} (ref 0.0–4.4)
Cholesterol, Total: 153 mg/dL (ref 100–199)
HDL: 59 mg/dL (ref >39)
LDL Chol Calc (NIH): 80 mg/dL (ref 0–99)
Triglycerides: 70 mg/dL (ref 0–149)
VLDL Cholesterol Cal: 14 mg/dL (ref 5–40)

## 2023-06-26 LAB — TSH: TSH: 2.24 u[IU]/mL (ref 0.450–4.500)

## 2023-06-26 LAB — HEMOGLOBIN A1C
Est. average glucose Bld gHb Est-mCnc: 108 mg/dL
Hgb A1c MFr Bld: 5.4 % (ref 4.8–5.6)

## 2023-06-26 LAB — VITAMIN B12: Vitamin B-12: 694 pg/mL (ref 232–1245)

## 2023-06-30 ENCOUNTER — Ambulatory Visit: Payer: Medicare HMO

## 2023-06-30 DIAGNOSIS — M6281 Muscle weakness (generalized): Secondary | ICD-10-CM

## 2023-06-30 DIAGNOSIS — M25552 Pain in left hip: Secondary | ICD-10-CM

## 2023-06-30 DIAGNOSIS — R2689 Other abnormalities of gait and mobility: Secondary | ICD-10-CM

## 2023-06-30 NOTE — Therapy (Signed)
 OUTPATIENT PHYSICAL THERAPY TREATMENT NOTE   Patient Name: Candice Watkins MRN: [id number] DOB:09-Feb-1971, 53 y.o., female Today's Date: 06/30/2023  END OF SESSION:  PT End of Session - 06/30/23 1351     Visit Number 2    Number of Visits 9    Date for PT Re-Evaluation 07/22/23    Authorization Type [employer] MCR    PT Start Time 1357    PT Stop Time 1408    PT Time Calculation (min) 11 min    Activity Tolerance Patient limited by pain;Patient tolerated treatment well    Behavior During Therapy [facility] for tasks assessed/performed              Past Medical History:  Diagnosis Date   Anxiety    Arthritis    "knees, ankles" (10/11/2015)   Asthma    Depression    Diabetes mellitus without complication (HCC)    Heartburn    HLD (hyperlipidemia)    Hypertension    Hypothyroidism    Kidney stones    Migraine    "monthly" (10/11/2015)   PONV (postoperative nausea and vomiting) X 1   Primary hyperparathyroidism (HCC)    Seasonal allergies    Sleep apnea    Stomach ulcer    Past Surgical History:  Procedure Laterality Date   BREAST BIOPSY Bilateral    neg- core   CESAREAN SECTION  2000   COLONOSCOPY WITH PROPOFOL  N/A 05/02/2022   Procedure: COLONOSCOPY WITH PROPOFOL ;  Surgeon: Selena Daily, MD;  Location: [facility] ENDOSCOPY;  Service: Gastroenterology;  Laterality: N/A;   COLONOSCOPY WITH PROPOFOL  N/A 07/22/2022   Procedure: COLONOSCOPY WITH PROPOFOL ;  Surgeon: Selena Daily, MD;  Location: [facility] ENDOSCOPY;  Service: Gastroenterology;  Laterality: N/A;   ESOPHAGOGASTRODUODENOSCOPY (EGD) WITH PROPOFOL  N/A 05/02/2022   Procedure: ESOPHAGOGASTRODUODENOSCOPY (EGD) WITH PROPOFOL ;  Surgeon: Selena Daily, MD;  Location: [facility] ENDOSCOPY;  Service: Gastroenterology;  Laterality: N/A;   JOINT REPLACEMENT     KNEE ARTHROSCOPY Right    THYROID  SURGERY     "removed tumor"   TOTAL KNEE ARTHROPLASTY Right 10/11/2015   TOTAL KNEE ARTHROPLASTY Right 10/11/2015   Procedure:  TOTAL KNEE ARTHROPLASTY;  Surgeon: Ferd Householder, MD;  Location: [facility] OR;  Service: Orthopedics;  Laterality: Right;   TUBAL LIGATION     VAGINAL HYSTERECTOMY     Patient Active Problem List   Diagnosis Date Noted   Obesity, morbid (HCC) 06/18/2023   Prediabetes 12/11/2022   Mixed hyperlipidemia 12/11/2022   Essential hypertension, benign 12/11/2022   Vitamin D  deficiency, unspecified 12/11/2022   B12 deficiency due to diet 12/11/2022   History of colonic polyps 07/22/2022   Dyspepsia 05/02/2022   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   Screening for colon cancer 05/02/2022   Polyp of colon 05/02/2022   MDD (major depressive disorder), recurrent severe, without psychosis (HCC) 01/28/2017   S/P total knee replacement 10/11/2015   Osteoarthritis of knee 09/27/2013   Patellofemoral dysfunction 09/27/2013   Hypothyroidism (acquired) 09/14/2007   Allergic rhinitis 03/04/2006    PCP: Trenda Frisk, FNP   REFERRING PROVIDER:   [name], MD    REFERRING DIAG: Left hip OA  THERAPY DIAG:  Pain in left hip  Muscle weakness (generalized)  Other abnormalities of gait and mobility  Rationale for Evaluation and Treatment: Rehabilitation  ONSET DATE: 02/03/2023  SUBJECTIVE:   SUBJECTIVE STATEMENT: Patient reports that she saw Dr. Abigail Abler today where she received an injection in her hip, has had these in the past  with varying success. She mentioned that they are planning to replace her Lt hip in the beginning of the April.   EVAL: Patient reports to PT with left hip that began hurting over the past couple of weeks. She states that she did [place] a [place] the beach in [place] 2024. She lost 65 pounds last year d/t need for left TKR, but feels significantly more limited by left hip pain at this time. She reports pain and difficulty with sitting on low surfaces like toilet, performing bed mobility, standing for a long time, and getting into her SUV.   PERTINENT HISTORY: Relevant  PMHx includes right TKR, anxiety, arthritis, depression, diabetes, HTN, hypothyroidism, migraines, hyperparathyroidism, sleep apnea, obesity, B12 deficiency  PAIN:  Are you having pain?  Yes: NPRS scale: 10/10 Pain location: Left hip  Pain description: constant ache  Aggravating factors: walking, sitting down on toilet  Relieving factors: heating pad  PRECAUTIONS: Fall  RED FLAGS: None   WEIGHT BEARING RESTRICTIONS: No  FALLS:  Has patient fallen in last 6 months? Yes. Number of falls 2x after left knee buckled/slipped & fall   LIVING ENVIRONMENT: Lives with: lives with their family Lives in: House/apartment Stairs: No Has following equipment at home: Single point cane and Grab bars  OCCUPATION: n/a, disability   PLOF: Independent  PATIENT GOALS: To better tolerate my pain, and have some mobility   NEXT MD VISIT: 06/25/2023  OBJECTIVE:  Note: Objective measures were completed at Evaluation unless otherwise noted.  DIAGNOSTIC FINDINGS:   no relevant imaging results available in epic; none included in referral     PATIENT SURVEYS:  LEFS 8/80  COGNITION: Overall cognitive status: Within functional limits for tasks assessed     SENSATION: Not tested  POSTURE: flexed trunk   PALPATION: Moderate-to-severe tenderness to palpation along L hip abductor and distal IT band   LOWER EXTREMITY ROM:  Passive ROM Right eval Left eval  Hip flexion  100  Hip extension    Hip abduction  30  Hip adduction    Hip internal rotation  40  Hip external rotation  10  Knee flexion    Knee extension    Ankle dorsiflexion    Ankle plantarflexion    Ankle inversion    Ankle eversion     (Blank rows = not tested)  LOWER EXTREMITY MMT:  MMT Right eval Left eval  Hip flexion 4 2+, p!  Hip extension    Hip abduction 4+ 4  Hip adduction 4+ 4  Hip internal rotation    Hip external rotation    Knee flexion 5 4+  Knee extension 5 5  Ankle dorsiflexion    Ankle  plantarflexion    Ankle inversion    Ankle eversion     (Blank rows = not tested)  LOWER EXTREMITY SPECIAL TESTS:  Hip special tests: Portia Brittle (FABER) test: positive  and Ober's test: positive   GAIT: Distance walked: 30 feet, from lobby to treatment room  Assistive device utilized: None Level of assistance: Complete Independence Comments: significant antalgic gait pattern with right hip hike and decreased left stance time  TREATMENT DATE:  [facility] Adult PT Treatment:                                                DATE: 06/30/23 Arrived - no charge for today's visit, see assessment.  [facility] Adult PT Treatment:                                                DATE: 06/24/2023   Initial evaluation: see patient education and home exercise program as noted below      PATIENT EDUCATION:  Education details: reviewed initial home exercise program; discussion of POC, prognosis and goals for skilled PT  Person educated: Patient Education method: Explanation, Demonstration, and Handouts Education comprehension: verbalized understanding, returned demonstration, and needs further education  HOME EXERCISE PROGRAM: Access Code: [email or web address] URL: [email or web address] Date: 06/24/2023 Prepared by: Arlester Bence  Exercises - Hooklying Isometric Hip Abduction with Belt  - 2 x daily - 7 x weekly - 2 sets - 10 reps - 3 sec hold - Sidelying ITB Stretch off Table  - 2 x daily - 7 x weekly - 2 sets - 20-30 sec hold - Seated March  - 2 x daily - 7 x weekly - 2 sets - 10 reps  ASSESSMENT:  CLINICAL IMPRESSION: Patient presents to first follow up PT session reporting she just had injection in her Lt hip before arriving to session. Advised patient that PT was not indicated within close timeframe of receiving injection. She states Dr Abigail Abler is planning to replace her Lt hip  in early April, and possibly also her Lt knee later this year. Will discuss options for conserving visits d/t patient's insurance with supervising PT and advise next session. Arrived - no charge for today's visit, will resume PT on Wednesday at next scheduled session.  EVAL: Candice Watkins is a 53 y.o. female who was seen today for physical therapy evaluation and treatment for persistent left hip pain with referring diagnosis of left hip OA. She is demonstrating significant deficits of left hip AROM, left LE strength, and altered gait mechanics. She has related pain and difficulty with daily activities including walking, squatting, heavy lifting, household chores, stair climbing, and bed mobility tasks. She requires skilled PT services at this time to address relevant deficits and improve overall function.     OBJECTIVE IMPAIRMENTS: Abnormal gait, decreased activity tolerance, decreased balance, decreased mobility, decreased strength, and pain.   ACTIVITY LIMITATIONS: carrying, lifting, bending, sitting, standing, squatting, sleeping, stairs, transfers, bed mobility, dressing, hygiene/grooming, and locomotion level  PARTICIPATION LIMITATIONS: meal prep, cleaning, laundry, driving, shopping, and community activity  PERSONAL FACTORS: Past/current experiences, Time since onset of injury/illness/exacerbation, and 3+ comorbidities: Relevant PMHx includes anxiety, arthritis, depression, diabetes, HTN, hypothyroidism, migraines, hyperparathyroidism, sleep apnea, obesity, B12 deficiency  are also affecting patient's functional outcome.   REHAB POTENTIAL: Fair    CLINICAL DECISION MAKING: Evolving/moderate complexity  EVALUATION COMPLEXITY: Moderate   GOALS: Goals reviewed with patient? Yes  SHORT TERM GOALS = LONG TERM GOALS: Target date: 07/22/2023   Patient will report improved overall functional ability with ODI score of 30/80 or greater.  Baseline: 8/80 Goal status: INITIAL  2.  Patient will  report improved tolerance of toilet transfers, with possible raised seat  height.  Baseline: up to 10/10 pain, moderate-to-severe difficulty  Goal status: INITIAL  3.  Patient will report ability to get into and out of her normal vehicle with no more than 6/10 pain.  Baseline: up to 10/10 pain, moderate-to-severe difficulty  Goal status: INITIAL  4.  Patient will demonstrate improved Left hip flexion strength to at least 4-/5 MMT.  Baseline: see objective measures Goal status: INITIAL  5.  Patient will demonstrate ability to perform floor to waist lifting of at least 10# using appropriate body mechanics and with no more than minimal pain in order to safely perform normal daily/occupational tasks.  Baseline: unable  Goal status: INITIAL  6.  Patient will be independent with initial home program for hip mobility, strength and pain modulation.  Baseline: provided at eval Goal status: INITIAL   PLAN:  PT FREQUENCY: 1-2x/week  PT DURATION: 4 weeks  PLANNED INTERVENTIONS: 97164- PT Re-evaluation, 97110-Therapeutic exercises, 97530- Therapeutic activity, [id number]- Neuromuscular re-education, 97535- Self Care, 16109- Manual therapy, [id number]- Aquatic Therapy, 97014- Electrical stimulation (unattended), [id number]- Electrical stimulation (manual), Patient/Family education, Taping, Dry Needling, Joint mobilization, Cryotherapy, and Moist heat  PLAN FOR NEXT SESSION: pain modulation activities via modalities, manual therapy, hip distraction and aerobic activities as tolerated. ROM activities, isometric hip strengthening   Anna Kettering PTA  06/30/2023 2:16 PM

## 2023-07-01 ENCOUNTER — Encounter: Payer: Self-pay | Admitting: Family

## 2023-07-02 ENCOUNTER — Ambulatory Visit: Payer: Medicare HMO

## 2023-07-03 DIAGNOSIS — F339 Major depressive disorder, recurrent, unspecified: Secondary | ICD-10-CM | POA: Diagnosis not present

## 2023-07-04 ENCOUNTER — Encounter: Payer: Self-pay | Admitting: Family

## 2023-07-06 LAB — ANA 12 PROFILE, DO ALL RDL
Anti-Cardiolipin Ab, IgA (RDL): <12 [APL'U]/mL (ref <12)
Anti-Cardiolipin Ab, IgG (RDL): <15 [GPL'U]/mL (ref <15)
Anti-Cardiolipin Ab, IgM (RDL): <13 [MPL'U]/mL (ref <13)
Anti-Centromere Ab (RDL): <1:40 {titer}
Anti-La (SS-B) Ab (RDL): <20 U (ref <20)
Anti-Nuclear Ab by IFA (RDL): POSITIVE — AB
Anti-Ro (SS-A) Ab (RDL): 23 U — ABNORMAL HIGH (ref <20)
Anti-Scl-70 Ab (RDL): 28 U — ABNORMAL HIGH (ref <20)
Anti-Sm Ab (RDL): <20 U (ref <20)
Anti-TPO Ab (RDL): <9 [IU]/mL (ref <9.0)
Anti-U1 RNP Ab (RDL): <20 U (ref <20)
Anti-dsDNA Ab by Farr(RDL): <8 [IU]/mL (ref <8.0)
C3 Complement (RDL): 192 mg/dL — ABNORMAL HIGH (ref 90–180)
C4 Complement (RDL): 53 mg/dL — ABNORMAL HIGH (ref 14–44)

## 2023-07-06 LAB — REPEAT ANTI-SCL-70: Repeat Anti-Scl-70: NEGATIVE

## 2023-07-06 LAB — ANA TITER AND PATTERN: Speckled Pattern: 1:160 {titer} — ABNORMAL HIGH

## 2023-07-06 LAB — RHEUMATOID ARTHRITIS PROFILE
Cyclic Citrullin Peptide Ab: 4 U (ref 0–19)
Rheumatoid fact SerPl-aCnc: <10 [IU]/mL (ref <14.0)

## 2023-07-08 ENCOUNTER — Encounter: Payer: Self-pay | Admitting: Family

## 2023-07-08 ENCOUNTER — Ambulatory Visit: Payer: Medicare HMO

## 2023-07-08 DIAGNOSIS — M25552 Pain in left hip: Secondary | ICD-10-CM | POA: Diagnosis not present

## 2023-07-08 DIAGNOSIS — R2689 Other abnormalities of gait and mobility: Secondary | ICD-10-CM

## 2023-07-08 DIAGNOSIS — M778 Other enthesopathies, not elsewhere classified: Secondary | ICD-10-CM

## 2023-07-08 DIAGNOSIS — M6281 Muscle weakness (generalized): Secondary | ICD-10-CM

## 2023-07-08 NOTE — Therapy (Unsigned)
OUTPATIENT PHYSICAL THERAPY TREATMENT NOTE   Patient Name: Candice Watkins MRN: [id number] DOB:08-22-1970, 53 y.o., female Today's Date: 07/08/2023  END OF SESSION:  PT End of Session - 07/08/23 1401     Visit Number 3    Number of Visits 9    Date for PT Re-Evaluation 07/22/23    Authorization Type Aet[employer] MCR    PT Start Time 0203    PT Stop Time 0241    PT Time Calculation (min) 38 min    Activity Tolerance Patient limited by pain;Patient tolerated treatment well    Behavior During Therapy [facility] for tasks assessed/performed              Past Medical History:  Diagnosis Date   Anxiety    Arthritis    "knees, ankles" (10/11/2015)   Asthma    Depression    Diabetes mellitus without complication (HCC)    Heartburn    HLD (hyperlipidemia)    Hypertension    Hypothyroidism    Kidney stones    Migraine    "monthly" (10/11/2015)   PONV (postoperative nausea and vomiting) X 1   Primary hyperparathyroidism (HCC)    Seasonal allergies    Sleep apnea    Stomach ulcer    Past Surgical History:  Procedure Laterality Date   BREAST BIOPSY Bilateral    neg- core   CESAREAN SECTION  2000   COLONOSCOPY WITH PROPOFOL N/A 05/02/2022   Procedure: COLONOSCOPY WITH PROPOFOL;  Surgeon: Toney Reil, MD;  Location: [facility] ENDOSCOPY;  Service: Gastroenterology;  Laterality: N/A;   COLONOSCOPY WITH PROPOFOL N/A 07/22/2022   Procedure: COLONOSCOPY WITH PROPOFOL;  Surgeon: Toney Reil, MD;  Location: [facility] ENDOSCOPY;  Service: Gastroenterology;  Laterality: N/A;   ESOPHAGOGASTRODUODENOSCOPY (EGD) WITH PROPOFOL N/A 05/02/2022   Procedure: ESOPHAGOGASTRODUODENOSCOPY (EGD) WITH PROPOFOL;  Surgeon: Toney Reil, MD;  Location: [facility] ENDOSCOPY;  Service: Gastroenterology;  Laterality: N/A;   JOINT REPLACEMENT     KNEE ARTHROSCOPY Right    THYROID SURGERY     "removed tumor"   TOTAL KNEE ARTHROPLASTY Right 10/11/2015   TOTAL KNEE ARTHROPLASTY Right 10/11/2015   Procedure:  TOTAL KNEE ARTHROPLASTY;  Surgeon: Loreta Ave, MD;  Location: [facility] OR;  Service: Orthopedics;  Laterality: Right;   TUBAL LIGATION     VAGINAL HYSTERECTOMY     Patient Active Problem List   Diagnosis Date Noted   Obesity, morbid (HCC) 06/18/2023   Prediabetes 12/11/2022   Mixed hyperlipidemia 12/11/2022   Essential hypertension, benign 12/11/2022   Vitamin D deficiency, unspecified 12/11/2022   B12 deficiency due to diet 12/11/2022   History of colonic polyps 07/22/2022   Dyspepsia 05/02/2022   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   Screening for colon cancer 05/02/2022   Polyp of colon 05/02/2022   MDD (major depressive disorder), recurrent severe, without psychosis (HCC) 01/28/2017   S/P total knee replacement 10/11/2015   Osteoarthritis of knee 09/27/2013   Patellofemoral dysfunction 09/27/2013   Hypothyroidism (acquired) 09/14/2007   Allergic rhinitis 03/04/2006    PCP: Miki Kins, FNP   REFERRING PROVIDER:   Sheral Apley, MD    REFERRING DIAG: Left hip OA  THERAPY DIAG:  Pain in left hip  Muscle weakness (generalized)  Other abnormalities of gait and mobility  Left shoulder tendinitis  Rationale for Evaluation and Treatment: Rehabilitation  ONSET DATE: 02/03/2023  SUBJECTIVE:   SUBJECTIVE STATEMENT: Patient reporting that she would like to decrease appt frequency to 1x/week d/t planned hip and knee surgery  this year and associated post-op rehab.  She continues to have moderate-to-severe hip pain with stiffness after sitting for a while.   EVAL: Patient reports to PT with left hip that began hurting over the past couple of weeks. She states that she did [place] a fall at the beach in [place] 2024. She lost 65 pounds last year d/t need for left TKR, but feels significantly more limited by left hip pain at this time. She reports pain and difficulty with sitting on low surfaces like toilet, performing bed mobility, standing for a long time, and getting into  her SUV.   PERTINENT HISTORY: Relevant PMHx includes right TKR, anxiety, arthritis, depression, diabetes, HTN, hypothyroidism, migraines, hyperparathyroidism, sleep apnea, obesity, B12 deficiency  PAIN:  Are you having pain?  Yes: NPRS scale: 10/10 Pain location: Left hip  Pain description: constant ache  Aggravating factors: walking, sitting down on toilet  Relieving factors: heating pad  PRECAUTIONS: Fall  RED FLAGS: None   WEIGHT BEARING RESTRICTIONS: No  FALLS:  Has patient fallen in last 6 months? Yes. Number of falls 2x after left knee buckled/slipped & fall   LIVING ENVIRONMENT: Lives with: lives with their family Lives in: House/apartment Stairs: No Has following equipment at home: Single point cane and Grab bars  OCCUPATION: n/a, disability   PLOF: Independent  PATIENT GOALS: To better tolerate my pain, and have some mobility   NEXT MD VISIT: 06/25/2023  OBJECTIVE:  Note: Objective measures were completed at Evaluation unless otherwise noted.  DIAGNOSTIC FINDINGS:   no relevant imaging results available in epic; none included in referral     PATIENT SURVEYS:  LEFS 8/80  COGNITION: Overall cognitive status: Within functional limits for tasks assessed     SENSATION: Not tested  POSTURE: flexed trunk   PALPATION: Moderate-to-severe tenderness to palpation along L hip abductor and distal IT band   LOWER EXTREMITY ROM:  Passive ROM Right eval Left eval  Hip flexion  100  Hip extension    Hip abduction  30  Hip adduction    Hip internal rotation  40  Hip external rotation  10  Knee flexion    Knee extension    Ankle dorsiflexion    Ankle plantarflexion    Ankle inversion    Ankle eversion     (Blank rows = not tested)  LOWER EXTREMITY MMT:  MMT Right eval Left eval  Hip flexion 4 2+, p!  Hip extension    Hip abduction 4+ 4  Hip adduction 4+ 4  Hip internal rotation    Hip external rotation    Knee flexion 5 4+  Knee extension  5 5  Ankle dorsiflexion    Ankle plantarflexion    Ankle inversion    Ankle eversion     (Blank rows = not tested)  LOWER EXTREMITY SPECIAL TESTS:  Hip special tests: Luisa Hart (FABER) test: positive  and Ober's test: positive   GAIT: Distance walked: 30 feet, from lobby to treatment room  Assistive device utilized: None Level of assistance: Complete Independence Comments: significant antalgic gait pattern with right hip hike and decreased left stance time  TREATMENT DATE:    [facility] Adult PT Treatment:                                                DATE: 07/08/2023  Therapeutic Exercise: Nustep x 12 minutes  Seated hip adduction ball squeeze, 5 sec x 15  Seated hip abduction isometric with belt above knee, 5 sec x 15 Seated hip flexion march with belt above knee, 2 x 10 each side  Supine LTR with pball x 6 each side (plan for LTR without pball nxt visit)  Patient reports feeling more anxious with this exercises feeling that she is going to fall, despite use of large treatment table and clinician positioned on closest side.  Supine DKTC with pball x 20  Reviewed HEP     [facility] Adult PT Treatment:                                                DATE: 06/30/23 Arrived - no charge for today's visit, see assessment.  [facility] Adult PT Treatment:                                                DATE: 06/24/2023   Initial evaluation: see patient education and home exercise program as noted below      PATIENT EDUCATION:  Education details: reviewed initial home exercise program; discussion of POC, prognosis and goals for skilled PT  Person educated: Patient Education method: Explanation, Demonstration, and Handouts Education comprehension: verbalized understanding, returned demonstration, and needs further education  HOME EXERCISE PROGRAM: Access Code: [email or web address] URL:  [email or web address] Date: 07/08/2023 Prepared by: Mauri Reading  Exercises - Sidelying ITB Stretch off Table  - 2 x daily - 7 x weekly - 2 sets - 20-30 sec hold - Seated March  - 1 x daily - 7 x weekly - 1 sets - 15 reps - 3 sec hold - Seated Isometric Hip Abduction with Belt  - 1 x daily - 7 x weekly - 1 sets - 15 reps - 3 sec hold - Seated Hip Adduction Isometrics with Ball  - 1 x daily - 7 x weekly - 1 sets - 15 reps - 3 sec hold  ASSESSMENT:  CLINICAL IMPRESSION: Gao had fair tolerance of exercises today. She responded best to use of Nustep at start of session, knee to chest stretch and isometric strengthening. Plan to discontinue use of physioball with lower trunk rotation at next visit d/t patient increased anxiety with this activity. Updated and reviewed current HEP with patient as well as plan to continue with PT 1x/week for 3-4 additional sessions prior to hip surgery in early April.    EVAL: Oda is a 53 y.o. female who was seen today for physical therapy evaluation and treatment for persistent left hip pain with referring diagnosis of left hip OA. She is demonstrating significant deficits of left hip AROM, left LE strength, and altered gait mechanics. She has related pain and difficulty with daily activities including walking, squatting, heavy lifting, household chores, stair climbing, and bed mobility tasks. She requires skilled PT services at  this time to address relevant deficits and improve overall function.     OBJECTIVE IMPAIRMENTS: Abnormal gait, decreased activity tolerance, decreased balance, decreased mobility, decreased strength, and pain.   ACTIVITY LIMITATIONS: carrying, lifting, bending, sitting, standing, squatting, sleeping, stairs, transfers, bed mobility, dressing, hygiene/grooming, and locomotion level  PARTICIPATION LIMITATIONS: meal prep, cleaning, laundry, driving, shopping, and community activity  PERSONAL FACTORS: Past/current  experiences, Time since onset of injury/illness/exacerbation, and 3+ comorbidities: Relevant PMHx includes anxiety, arthritis, depression, diabetes, HTN, hypothyroidism, migraines, hyperparathyroidism, sleep apnea, obesity, B12 deficiency  are also affecting patient's functional outcome.   REHAB POTENTIAL: Fair    CLINICAL DECISION MAKING: Evolving/moderate complexity  EVALUATION COMPLEXITY: Moderate   GOALS: Goals reviewed with patient? Yes  SHORT TERM GOALS = LONG TERM GOALS: Target date: 07/22/2023   Patient will report improved overall functional ability with ODI score of 30/80 or greater.  Baseline: 8/80 Goal status: INITIAL  2.  Patient will report improved tolerance of toilet transfers, with possible raised seat height.  Baseline: up to 10/10 pain, moderate-to-severe difficulty  Goal status: INITIAL  3.  Patient will report ability to get into and out of her normal vehicle with no more than 6/10 pain.  Baseline: up to 10/10 pain, moderate-to-severe difficulty  Goal status: INITIAL  4.  Patient will demonstrate improved Left hip flexion strength to at least 4-/5 MMT.  Baseline: see objective measures Goal status: INITIAL  5.  Patient will demonstrate ability to perform floor to waist lifting of at least 10# using appropriate body mechanics and with no more than minimal pain in order to safely perform normal daily/occupational tasks.  Baseline: unable  Goal status: INITIAL  6.  Patient will be independent with initial home program for hip mobility, strength and pain modulation.  Baseline: provided at eval Goal status: INITIAL   PLAN:  PT FREQUENCY: 1-2x/week  PT DURATION: 4 weeks  PLANNED INTERVENTIONS: 97164- PT Re-evaluation, 97110-Therapeutic exercises, 97530- Therapeutic activity, [id number]- Neuromuscular re-education, 97535- Self Care, [id number]- Manual therapy, [id number]- Aquatic Therapy, 97014- Electrical stimulation (unattended), [id number]- Electrical stimulation (manual),  Patient/Family education, Taping, Dry Needling, Joint mobilization, Cryotherapy, and Moist heat  PLAN FOR NEXT SESSION: pain modulation activities via modalities, manual therapy, hip distraction and aerobic activities as tolerated. ROM activities, isometric hip strengthening   Mauri Reading, PT, DPT  07/09/2023 7:18 PM

## 2023-07-10 DIAGNOSIS — F332 Major depressive disorder, recurrent severe without psychotic features: Secondary | ICD-10-CM | POA: Diagnosis not present

## 2023-07-15 ENCOUNTER — Other Ambulatory Visit: Payer: Medicare HMO

## 2023-07-15 ENCOUNTER — Ambulatory Visit: Payer: Medicare HMO

## 2023-07-15 DIAGNOSIS — E782 Mixed hyperlipidemia: Secondary | ICD-10-CM

## 2023-07-15 DIAGNOSIS — M6281 Muscle weakness (generalized): Secondary | ICD-10-CM

## 2023-07-15 DIAGNOSIS — I10 Essential (primary) hypertension: Secondary | ICD-10-CM | POA: Diagnosis not present

## 2023-07-15 DIAGNOSIS — R5383 Other fatigue: Secondary | ICD-10-CM

## 2023-07-15 DIAGNOSIS — M778 Other enthesopathies, not elsewhere classified: Secondary | ICD-10-CM

## 2023-07-15 DIAGNOSIS — R7303 Prediabetes: Secondary | ICD-10-CM | POA: Diagnosis not present

## 2023-07-15 DIAGNOSIS — E559 Vitamin D deficiency, unspecified: Secondary | ICD-10-CM

## 2023-07-15 DIAGNOSIS — M25552 Pain in left hip: Secondary | ICD-10-CM | POA: Diagnosis not present

## 2023-07-15 DIAGNOSIS — E039 Hypothyroidism, unspecified: Secondary | ICD-10-CM | POA: Diagnosis not present

## 2023-07-15 DIAGNOSIS — E538 Deficiency of other specified B group vitamins: Secondary | ICD-10-CM | POA: Diagnosis not present

## 2023-07-15 DIAGNOSIS — R2689 Other abnormalities of gait and mobility: Secondary | ICD-10-CM

## 2023-07-15 NOTE — Therapy (Signed)
 OUTPATIENT PHYSICAL THERAPY TREATMENT NOTE   Patient Name: Candice Watkins MRN: [id number] DOB:14-Aug-1970, 53 y.o., female Today's Date: 07/15/2023  END OF SESSION:   Visit Number 4 Number of Visits 9 Date for PT re-eval 07/22/2023  Authorization Type Aetna MCR Authorization Time Period Authorization Visit Number Progress Note due on visit #   PT start time 1402 PT stop time 1440 PT time calculation (min) 38 min    Past Medical History:  Diagnosis Date   Anxiety    Arthritis    "knees, ankles" (10/11/2015)   Asthma    Depression    Diabetes mellitus without complication (HCC)    Heartburn    HLD (hyperlipidemia)    Hypertension    Hypothyroidism    Kidney stones    Migraine    "monthly" (10/11/2015)   PONV (postoperative nausea and vomiting) X 1   Primary hyperparathyroidism (HCC)    Seasonal allergies    Sleep apnea    Stomach ulcer    Past Surgical History:  Procedure Laterality Date   BREAST BIOPSY Bilateral    neg- core   CESAREAN SECTION  2000   COLONOSCOPY WITH PROPOFOL N/A 05/02/2022   Procedure: COLONOSCOPY WITH PROPOFOL;  Surgeon: Toney Reil, MD;  Location: [facility] ENDOSCOPY;  Service: Gastroenterology;  Laterality: N/A;   COLONOSCOPY WITH PROPOFOL N/A 07/22/2022   Procedure: COLONOSCOPY WITH PROPOFOL;  Surgeon: Toney Reil, MD;  Location: [facility] ENDOSCOPY;  Service: Gastroenterology;  Laterality: N/A;   ESOPHAGOGASTRODUODENOSCOPY (EGD) WITH PROPOFOL N/A 05/02/2022   Procedure: ESOPHAGOGASTRODUODENOSCOPY (EGD) WITH PROPOFOL;  Surgeon: Toney Reil, MD;  Location: [facility] ENDOSCOPY;  Service: Gastroenterology;  Laterality: N/A;   JOINT REPLACEMENT     KNEE ARTHROSCOPY Right    THYROID SURGERY     "removed tumor"   TOTAL KNEE ARTHROPLASTY Right 10/11/2015   TOTAL KNEE ARTHROPLASTY Right 10/11/2015   Procedure: TOTAL KNEE ARTHROPLASTY;  Surgeon: Loreta Ave, MD;  Location: [facility] OR;  Service: Orthopedics;  Laterality: Right;   TUBAL  LIGATION     VAGINAL HYSTERECTOMY     Patient Active Problem List   Diagnosis Date Noted   Obesity, morbid (HCC) 06/18/2023   Prediabetes 12/11/2022   Mixed hyperlipidemia 12/11/2022   Essential hypertension, benign 12/11/2022   Vitamin D deficiency, unspecified 12/11/2022   B12 deficiency due to diet 12/11/2022   History of colonic polyps 07/22/2022   Dyspepsia 05/02/2022   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   Screening for colon cancer 05/02/2022   Polyp of colon 05/02/2022   MDD (major depressive disorder), recurrent severe, without psychosis (HCC) 01/28/2017   S/P total knee replacement 10/11/2015   Osteoarthritis of knee 09/27/2013   Patellofemoral dysfunction 09/27/2013   Hypothyroidism (acquired) 09/14/2007   Allergic rhinitis 03/04/2006    PCP: Miki Kins, FNP   REFERRING PROVIDER:   Sheral Apley, MD    REFERRING DIAG: Left hip OA  THERAPY DIAG:  Pain in left hip  Muscle weakness (generalized)  Other abnormalities of gait and mobility  Left shoulder tendinitis  Rationale for Evaluation and Treatment: Rehabilitation  ONSET DATE: 02/03/2023  SUBJECTIVE:   SUBJECTIVE STATEMENT: Patient reporting she continues to have moderate-to-severe hip pain with stiffness after sitting for a while.   EVAL: Patient reports to PT with left hip that began hurting over the past couple of weeks. She states that she did [place]24. She lost 65 pounds last year d/t need for left TKR, but feels significantly  more limited by left hip pain at this time. She reports pain and difficulty with sitting on low surfaces like toilet, performing bed mobility, standing for a long time, and getting into her SUV.   PERTINENT HISTORY: Relevant PMHx includes right TKR, anxiety, arthritis, depression, diabetes, HTN, hypothyroidism, migraines, hyperparathyroidism, sleep apnea, obesity, B12 deficiency  PAIN:  Are you having pain?  Yes: NPRS scale:  10/10 Pain location: Left hip  Pain description: constant ache  Aggravating factors: walking, sitting down on toilet  Relieving factors: heating pad  PRECAUTIONS: Fall  RED FLAGS: None   WEIGHT BEARING RESTRICTIONS: No  FALLS:  Has patient fallen in last 6 months? Yes. Number of falls 2x after left knee buckled/slipped & fall   LIVING ENVIRONMENT: Lives with: lives with their family Lives in: House/apartment Stairs: No Has following equipment at home: Single point cane and Grab bars  OCCUPATION: n/a, disability   PLOF: Independent  PATIENT GOALS: To better tolerate my pain, and have some mobility   NEXT MD VISIT: 06/25/2023  OBJECTIVE:  Note: Objective measures were completed at Evaluation unless otherwise noted.  DIAGNOSTIC FINDINGS:   no relevant imaging results available in epic; none included in referral     PATIENT SURVEYS:  LEFS 8/80  COGNITION: Overall cognitive status: Within functional limits for tasks assessed     SENSATION: Not tested  POSTURE: flexed trunk   PALPATION: Moderate-to-severe tenderness to palpation along L hip abductor and distal IT band   LOWER EXTREMITY ROM:  Passive ROM Right eval Left eval  Hip flexion  100  Hip extension    Hip abduction  30  Hip adduction    Hip internal rotation  40  Hip external rotation  10  Knee flexion    Knee extension    Ankle dorsiflexion    Ankle plantarflexion    Ankle inversion    Ankle eversion     (Blank rows = not tested)  LOWER EXTREMITY MMT:  MMT Right eval Left eval  Hip flexion 4 2+, p!  Hip extension    Hip abduction 4+ 4  Hip adduction 4+ 4  Hip internal rotation    Hip external rotation    Knee flexion 5 4+  Knee extension 5 5  Ankle dorsiflexion    Ankle plantarflexion    Ankle inversion    Ankle eversion     (Blank rows = not tested)  LOWER EXTREMITY SPECIAL TESTS:  Hip special tests: Luisa Hart (FABER) test: positive  and Ober's test: positive    GAIT: Distance walked: 30 feet, from lobby to treatment room  Assistive device utilized: None Level of assistance: Complete Independence Comments: significant antalgic gait pattern with right hip hike and decreased left stance time                                                                                                                                TREATMENT DATE:  [facility] Adult PT Treatment:                                                DATE: 07/15/2023  Therapeutic Exercise: Nustep, level 4, 10 minutes  Seated hip adduction ball squeeze, 5 sec x 25 Seated hip abduction isometric with belt above knee, 5 sec x 20 Seated hip flexion march with belt above knee, 2 x 20 each side  Supine DKTC with pball x 20  Seated march step over with GTB above knees Reviewed HEP   [facility] Adult PT Treatment:                                                DATE: 07/08/2023  Therapeutic Exercise: Nustep x 12 minutes  Seated hip adduction ball squeeze, 5 sec x 15  Seated hip abduction isometric with belt above knee, 5 sec x 15 Seated hip flexion march with belt above knee, 2 x 10 each side  Supine LTR with pball x 6 each side (plan for LTR without pball nxt visit)  Patient reports feeling more anxious with this exercises feeling that she is going to fall, despite use of large treatment table and clinician positioned on closest side.  Supine DKTC with pball x 20  Reviewed HEP     [facility] Adult PT Treatment:                                                DATE: 06/30/23 Arrived - no charge for today's visit, see assessment.  [facility] Adult PT Treatment:                                                DATE: 06/24/2023   Initial evaluation: see patient education and home exercise program as noted below      PATIENT EDUCATION:  Education details: reviewed initial home exercise program; discussion of POC, prognosis and goals for skilled PT  Person educated: Patient Education method: Explanation,  Demonstration, and Handouts Education comprehension: verbalized understanding, returned demonstration, and needs further education  HOME EXERCISE PROGRAM: Access Code: [email or web address] URL: [email or web address] Date: 07/08/2023 Prepared by: Mauri Reading  Exercises - Sidelying ITB Stretch off Table  - 2 x daily - 7 x weekly - 2 sets - 20-30 sec hold - Seated March  - 1 x daily - 7 x weekly - 1 sets - 15 reps - 3 sec hold - Seated Isometric Hip Abduction with Belt  - 1 x daily - 7 x weekly - 1 sets - 15 reps - 3 sec hold - Seated Hip Adduction Isometrics with Ball  - 1 x daily - 7 x weekly - 1 sets - 15 reps - 3 sec hold  ASSESSMENT:  CLINICAL IMPRESSION: Patient continues to have fair tolerance of exercises. She may benefit from standing exercises at next visit d/t pain with prolonged sitting. We will continue to assess response to current POC and continue 1x/week.  EVAL: Josi is a 53 y.o. female who was seen today for physical therapy evaluation and treatment for persistent left hip pain with referring diagnosis of left hip OA. She is demonstrating significant deficits of left hip AROM, left LE strength, and altered gait mechanics. She has related pain and difficulty with daily activities including walking, squatting, heavy lifting, household chores, stair climbing, and bed mobility tasks. She requires skilled PT services at this time to address relevant deficits and improve overall function.     OBJECTIVE IMPAIRMENTS: Abnormal gait, decreased activity tolerance, decreased balance, decreased mobility, decreased strength, and pain.   ACTIVITY LIMITATIONS: carrying, lifting, bending, sitting, standing, squatting, sleeping, stairs, transfers, bed mobility, dressing, hygiene/grooming, and locomotion level  PARTICIPATION LIMITATIONS: meal prep, cleaning, laundry, driving, shopping, and community activity  PERSONAL FACTORS: Past/current experiences, Time since onset of  injury/illness/exacerbation, and 3+ comorbidities: Relevant PMHx includes anxiety, arthritis, depression, diabetes, HTN, hypothyroidism, migraines, hyperparathyroidism, sleep apnea, obesity, B12 deficiency  are also affecting patient's functional outcome.   REHAB POTENTIAL: Fair    CLINICAL DECISION MAKING: Evolving/moderate complexity  EVALUATION COMPLEXITY: Moderate   GOALS: Goals reviewed with patient? Yes  SHORT TERM GOALS = LONG TERM GOALS: Target date: 07/22/2023   Patient will report improved overall functional ability with ODI score of 30/80 or greater.  Baseline: 8/80 Goal status: INITIAL  2.  Patient will report improved tolerance of toilet transfers, with possible raised seat height.  Baseline: up to 10/10 pain, moderate-to-severe difficulty  Goal status: INITIAL  3.  Patient will report ability to get into and out of her normal vehicle with no more than 6/10 pain.  Baseline: up to 10/10 pain, moderate-to-severe difficulty  Goal status: INITIAL  4.  Patient will demonstrate improved Left hip flexion strength to at least 4-/5 MMT.  Baseline: see objective measures Goal status: INITIAL  5.  Patient will demonstrate ability to perform floor to waist lifting of at least 10# using appropriate body mechanics and with no more than minimal pain in order to safely perform normal daily/occupational tasks.  Baseline: unable  Goal status: INITIAL  6.  Patient will be independent with initial home program for hip mobility, strength and pain modulation.  Baseline: provided at eval Goal status: INITIAL   PLAN:  PT FREQUENCY: 1-2x/week  PT DURATION: 4 weeks  PLANNED INTERVENTIONS: 97164- PT Re-evaluation, 97110-Therapeutic exercises, 97530- Therapeutic activity, [id number]- Neuromuscular re-education, 97535- Self Care, 16109- Manual therapy, [id number]- Aquatic Therapy, 97014- Electrical stimulation (unattended), [id number]- Electrical stimulation (manual), Patient/Family education, Taping,  Dry Needling, Joint mobilization, Cryotherapy, and Moist heat  PLAN FOR NEXT SESSION: pain modulation activities via modalities, manual therapy, hip distraction and aerobic activities as tolerated. ROM activities, isometric hip strengthening   Mauri Reading, PT, DPT  07/18/2023 8:56 AM

## 2023-07-16 LAB — CMP14+EGFR
ALT: 9 [IU]/L (ref 0–32)
AST: 14 [IU]/L (ref 0–40)
Albumin: 3.9 g/dL (ref 3.8–4.9)
Alkaline Phosphatase: 74 [IU]/L (ref 44–121)
BUN/Creatinine Ratio: 9 (ref 9–23)
BUN: 11 mg/dL (ref 6–24)
Bilirubin Total: 0.7 mg/dL (ref 0.0–1.2)
CO2: 26 mmol/L (ref 20–29)
Calcium: 9.9 mg/dL (ref 8.7–10.2)
Chloride: 100 mmol/L (ref 96–106)
Creatinine, Ser: 1.16 mg/dL — ABNORMAL HIGH (ref 0.57–1.00)
Globulin, Total: 3.2 g/dL (ref 1.5–4.5)
Glucose: 87 mg/dL (ref 70–99)
Potassium: 4 mmol/L (ref 3.5–5.2)
Sodium: 139 mmol/L (ref 134–144)
Total Protein: 7.1 g/dL (ref 6.0–8.5)
eGFR: 57 mL/min/{1.73_m2} — ABNORMAL LOW (ref >59)

## 2023-07-16 LAB — VITAMIN D 25 HYDROXY (VIT D DEFICIENCY, FRACTURES): Vit D, 25-Hydroxy: 39 ng/mL (ref 30.0–100.0)

## 2023-07-16 LAB — LIPID PANEL
Chol/HDL Ratio: 2.6 {ratio} (ref 0.0–4.4)
Cholesterol, Total: 161 mg/dL (ref 100–199)
HDL: 63 mg/dL (ref >39)
LDL Chol Calc (NIH): 86 mg/dL (ref 0–99)
Triglycerides: 57 mg/dL (ref 0–149)
VLDL Cholesterol Cal: 12 mg/dL (ref 5–40)

## 2023-07-16 LAB — HEMOGLOBIN A1C
Est. average glucose Bld gHb Est-mCnc: 108 mg/dL
Hgb A1c MFr Bld: 5.4 % (ref 4.8–5.6)

## 2023-07-16 LAB — TSH: TSH: 2.12 u[IU]/mL (ref 0.450–4.500)

## 2023-07-16 LAB — VITAMIN B12: Vitamin B-12: 685 pg/mL (ref 232–1245)

## 2023-07-22 ENCOUNTER — Ambulatory Visit: Payer: Medicare HMO

## 2023-07-22 ENCOUNTER — Encounter: Payer: Self-pay | Admitting: Family

## 2023-07-31 ENCOUNTER — Other Ambulatory Visit: Payer: Self-pay

## 2023-08-07 DIAGNOSIS — F332 Major depressive disorder, recurrent severe without psychotic features: Secondary | ICD-10-CM | POA: Diagnosis not present

## 2023-08-08 ENCOUNTER — Other Ambulatory Visit: Payer: Self-pay

## 2023-08-08 DIAGNOSIS — N1831 Chronic kidney disease, stage 3a: Secondary | ICD-10-CM

## 2023-08-10 ENCOUNTER — Other Ambulatory Visit: Payer: Self-pay | Admitting: Gastroenterology

## 2023-08-10 ENCOUNTER — Other Ambulatory Visit: Payer: Self-pay | Admitting: Family

## 2023-08-11 ENCOUNTER — Other Ambulatory Visit: Payer: Self-pay | Admitting: Family

## 2023-08-14 ENCOUNTER — Other Ambulatory Visit: Payer: Self-pay

## 2023-08-14 NOTE — Telephone Encounter (Signed)
 Pended the referral to you for nephrology

## 2023-08-15 ENCOUNTER — Other Ambulatory Visit: Payer: Self-pay | Admitting: Family

## 2023-08-15 DIAGNOSIS — R7303 Prediabetes: Secondary | ICD-10-CM

## 2023-08-27 DIAGNOSIS — E785 Hyperlipidemia, unspecified: Secondary | ICD-10-CM | POA: Diagnosis not present

## 2023-08-27 DIAGNOSIS — I10 Essential (primary) hypertension: Secondary | ICD-10-CM | POA: Diagnosis not present

## 2023-08-27 DIAGNOSIS — N1831 Chronic kidney disease, stage 3a: Secondary | ICD-10-CM | POA: Diagnosis not present

## 2023-09-01 DIAGNOSIS — M25552 Pain in left hip: Secondary | ICD-10-CM | POA: Diagnosis not present

## 2023-09-02 DIAGNOSIS — M6281 Muscle weakness (generalized): Secondary | ICD-10-CM | POA: Diagnosis not present

## 2023-09-02 DIAGNOSIS — M1612 Unilateral primary osteoarthritis, left hip: Secondary | ICD-10-CM | POA: Diagnosis not present

## 2023-09-02 DIAGNOSIS — R262 Difficulty in walking, not elsewhere classified: Secondary | ICD-10-CM | POA: Diagnosis not present

## 2023-09-02 DIAGNOSIS — M25652 Stiffness of left hip, not elsewhere classified: Secondary | ICD-10-CM | POA: Diagnosis not present

## 2023-09-09 NOTE — Patient Instructions (Signed)
 SURGICAL WAITING ROOM VISITATION  Patients having surgery or a procedure may have no more than 2 support people in the waiting area - these visitors may rotate.    Children under the age of 33 must have an adult with them who is not the patient.  Due to an increase in RSV and influenza rates and associated hospitalizations, children ages 18 and under may not visit patients in [facility] hospitals.  Visitors with respiratory illnesses are discouraged from visiting and should remain at home.  If the patient needs to stay at the hospital during part of their recovery, the visitor guidelines for inpatient rooms apply. Pre-op nurse will coordinate an appropriate time for 1 support person to accompany patient in pre-op.  This support person may not rotate.    Please refer to the [facility] website for the visitor guidelines for Inpatients (after your surgery is over and you are in a regular room).    Your procedure is scheduled on: 09/23/23   Report to [facility] Main Entrance    Report to admitting at 5:15 AM   Call this number if you have problems the morning of surgery [phone]   Do not eat food :After Midnight.   After Midnight you may have the following liquids until 4:30 AM DAY OF SURGERY  Water Non-Citrus Juices (without pulp, NO RED-Apple, White grape, White cranberry) Black Coffee (NO MILK/CREAM OR CREAMERS, sugar ok)  Clear Tea (NO MILK/CREAM OR CREAMERS, sugar ok) regular and decaf                             Plain Jell-O (NO RED)                                           Fruit ices (not with fruit pulp, NO RED)                                     Popsicles (NO RED)                                                               Sports drinks like Gatorade (NO RED)               The day of surgery:  Drink ONE (1) Pre-Surgery G2 at 4:30 AM the morning of surgery. Drink in one sitting. Do not sip.  This drink was given to you during your hospital  pre-op  appointment visit. Nothing else to drink after completing the  Pre-Surgery G2.          If you have questions, please contact your surgeon's office.   FOLLOW BOWEL PREP AND ANY ADDITIONAL PRE OP INSTRUCTIONS YOU RECEIVED FROM YOUR SURGEON'S OFFICE!!!     Oral Hygiene is also important to reduce your risk of infection.                                    Remember - BRUSH YOUR TEETH THE  MORNING OF SURGERY WITH YOUR REGULAR TOOTHPASTE  DENTURES WILL BE REMOVED PRIOR TO SURGERY PLEASE DO NOT APPLY "Poly grip" OR ADHESIVES!!!   Stop all vitamins and herbal supplements 7 days before surgery.   Take these medicines the morning of surgery with A SIP OF WATER: Inhalers, Zyrtec, Levothyroxine , Trintellix   DO NOT TAKE ANY ORAL DIABETIC MEDICATIONS DAY OF YOUR SURGERY  How to Manage Your Diabetes Before and After Surgery  Why is it important to control my blood sugar before and after surgery? Improving blood sugar levels before and after surgery helps healing and can limit problems. A way of improving blood sugar control is eating a healthy diet by:  Eating less sugar and carbohydrates  Increasing activity/exercise  Talking with your doctor about reaching your blood sugar goals High blood sugars (greater than 180 mg/dL) can raise your risk of infections and slow your recovery, so you will need to focus on controlling your diabetes during the weeks before surgery. Make sure that the doctor who takes care of your diabetes knows about your planned surgery including the date and location.  How do I manage my blood sugar before surgery? Check your blood sugar at least 4 times a day, starting 2 days before surgery, to make sure that the level is not too high or low. Check your blood sugar the morning of your surgery when you wake up and every 2 hours until you get to the Short Stay unit. If your blood sugar is less than 70 mg/dL, you will need to treat for low blood sugar: Do not take  insulin. Treat a low blood sugar (less than 70 mg/dL) with  cup of clear juice (cranberry or apple), 4 glucose tablets, OR glucose gel. Recheck blood sugar in 15 minutes after treatment (to make sure it is greater than 70 mg/dL). If your blood sugar is not greater than 70 mg/dL on recheck, call [phone] for further instructions. Report your blood sugar to the short stay nurse when you get to Short Stay.  If you are admitted to the hospital after surgery: Your blood sugar will be checked by the staff and you will probably be given insulin after surgery (instead of oral diabetes medicines) to make sure you have good blood sugar levels. The goal for blood sugar control after surgery is 80-180 mg/dL.   WHAT DO I DO ABOUT MY DIABETES MEDICATION?  Do not take oral diabetes medicines (pills) the morning of surgery.  Hold Mounjaro  7 days prior to surgery. Do not take 09/19/23.  DO NOT TAKE THE FOLLOWING 7 DAYS PRIOR TO SURGERY: Ozempic, Wegovy, Rybelsus (Semaglutide), Byetta (exenatide), Bydureon (exenatide ER), Victoza, Saxenda (liraglutide), or Trulicity (dulaglutide) Mounjaro  (Tirzepatide ) Adlyxin (Lixisenatide), Polyethylene Glycol Loxenatide.  Reviewed and Endorsed by [facility] Patient Education Committee, August 2015                              You may not have any metal on your body including hair pins, jewelry, and body piercing             Do not wear make-up, lotions, powders, perfumes, or deodorant  Do not wear nail polish including gel and S&S, artificial/acrylic nails, or any other type of covering on natural nails including finger and toenails. If you have artificial nails, gel coating, etc. that needs to be removed by a nail salon please have this removed prior to surgery or surgery may need to be  canceled/ delayed if the surgeon/ anesthesia feels like they are unable to be safely monitored.   Do not shave  48 hours prior to surgery.    Do not bring valuables to the hospital.  [facility] IS NOT             RESPONSIBLE   FOR VALUABLES.   Contacts, glasses, dentures or bridgework may not be worn into surgery.  DO NOT BRING YOUR HOME MEDICATIONS TO THE HOSPITAL. PHARMACY WILL DISPENSE MEDICATIONS LISTED ON YOUR MEDICATION LIST TO YOU DURING YOUR ADMISSION IN THE HOSPITAL!    Patients discharged on the day of surgery will not be allowed to drive home.  Someone NEEDS to stay with you for the first 24 hours after anesthesia.   Special Instructions: Bring a copy of your healthcare power of attorney and living will documents the day of surgery if you haven't scanned them before.              Please read over the following fact sheets you were given: IF YOU HAVE QUESTIONS ABOUT YOUR PRE-OP INSTRUCTIONS PLEASE CALL [phone] Party    If you received a COVID test during your pre-op visit  it is requested that you wear a mask when out in public, stay away from anyone that may not be feeling well and notify your surgeon if you develop symptoms. If you test positive for Covid or have been in contact with anyone that has tested positive in the last 10 days please notify you surgeon.    Pre-operative 5 CHG Bath Instructions   You can play a key role in reducing the risk of infection after surgery. Your skin needs to be as free of germs as possible. You can reduce the number of germs on your skin by washing with CHG (chlorhexidine  gluconate) soap before surgery. CHG is an antiseptic soap that kills germs and continues to kill germs even after washing.   DO NOT use if you have an allergy to chlorhexidine /CHG or antibacterial soaps. If your skin becomes reddened or irritated, stop using the CHG and notify one of our RNs at [phone].   Please shower with the CHG soap starting 4 days before surgery using the following schedule:     Please keep in mind the following:  DO NOT shave, including legs and underarms, starting the day of your first shower.   You may shave your  face at any point before/day of surgery.  Place clean sheets on your bed the day you start using CHG soap. Use a clean washcloth (not used since being washed) for each shower. DO NOT sleep with pets once you start using the CHG.   CHG Shower Instructions:  If you choose to wash your hair and private area, wash first with your normal shampoo/soap.  After you use shampoo/soap, rinse your hair and body thoroughly to remove shampoo/soap residue.  Turn the water OFF and apply about 3 tablespoons (45 ml) of CHG soap to a CLEAN washcloth.  Apply CHG soap ONLY FROM YOUR NECK DOWN TO YOUR TOES (washing for 3-5 minutes)  DO NOT use CHG soap on face, private areas, open wounds, or sores.  Pay special attention to the area where your surgery is being performed.  If you are having back surgery, having someone wash your back for you may be helpful. Wait 2 minutes after CHG soap is applied, then you may rinse off the CHG soap.  Pat dry with a clean towel  Put on  clean clothes/pajamas   If you choose to wear lotion, please use ONLY the CHG-compatible lotions on the back of this paper.     Additional instructions for the day of surgery: DO NOT APPLY any lotions, deodorants, cologne, or perfumes.   Put on clean/comfortable clothes.  Brush your teeth.  Ask your nurse before applying any prescription medications to the skin.      CHG Compatible Lotions   Aveeno Moisturizing lotion  Cetaphil Moisturizing Cream  Cetaphil Moisturizing Lotion  Clairol Herbal Essence Moisturizing Lotion, Dry Skin  Clairol Herbal Essence Moisturizing Lotion, Extra Dry Skin  Clairol Herbal Essence Moisturizing Lotion, Normal Skin  Curel Age Defying Therapeutic Moisturizing Lotion with Alpha Hydroxy  Curel Extreme Care Body Lotion  Curel Soothing Hands Moisturizing Hand Lotion  Curel Therapeutic Moisturizing Cream, Fragrance-Free  Curel Therapeutic Moisturizing Lotion, Fragrance-Free  Curel Therapeutic Moisturizing  Lotion, Original Formula  Eucerin Daily Replenishing Lotion  Eucerin Dry Skin Therapy Plus Alpha Hydroxy Crme  Eucerin Dry Skin Therapy Plus Alpha Hydroxy Lotion  Eucerin Original Crme  Eucerin Original Lotion  Eucerin Plus Crme Eucerin Plus Lotion  Eucerin TriLipid Replenishing Lotion  Keri Anti-Bacterial Hand Lotion  Keri Deep Conditioning Original Lotion Dry Skin Formula Softly Scented  Keri Deep Conditioning Original Lotion, Fragrance Free Sensitive Skin Formula  Keri Lotion Fast Absorbing Fragrance Free Sensitive Skin Formula  Keri Lotion Fast Absorbing Softly Scented Dry Skin Formula  Keri Original Lotion  Keri Skin Renewal Lotion Keri Silky Smooth Lotion  Keri Silky Smooth Sensitive Skin Lotion  Nivea Body Creamy Conditioning Oil  Nivea Body Extra Enriched Teacher, adult education Moisturizing Lotion Nivea Crme  Nivea Skin Firming Lotion  NutraDerm 30 Skin Lotion  NutraDerm Skin Lotion  NutraDerm Therapeutic Skin Cream  NutraDerm Therapeutic Skin Lotion  ProShield Protective Hand Cream  Provon moisturizing lotion      WHAT IS A BLOOD TRANSFUSION? Blood Transfusion Information  A transfusion is the replacement of blood or some of its parts. Blood is made up of multiple cells which provide different functions. Red blood cells carry oxygen and are used for blood loss replacement. White blood cells fight against infection. Platelets control bleeding. Plasma helps clot blood. Other blood products are available for specialized needs, such as hemophilia or other clotting disorders. BEFORE THE TRANSFUSION  Who gives blood for transfusions?  Healthy volunteers who are fully evaluated to make sure their blood is safe. This is blood bank blood. Transfusion therapy is the safest it has ever been in the practice of medicine. Before blood is taken from a donor, a complete history is taken to make sure that person has no history of diseases nor engages  in risky social behavior (examples are intravenous drug use or sexual activity with multiple partners). The donor's travel history is screened to minimize risk of transmitting infections, such as malaria. The donated blood is tested for signs of infectious diseases, such as HIV and hepatitis. The blood is then tested to be sure it is compatible with you in order to minimize the chance of a transfusion reaction. If you or a relative donates blood, this is often done in anticipation of surgery and is not appropriate for emergency situations. It takes many days to process the donated blood. RISKS AND COMPLICATIONS Although transfusion therapy is very safe and saves many lives, the main dangers of transfusion include:  Getting an infectious disease. Developing a transfusion reaction. This is an allergic reaction to something in  the blood you were given. Every precaution is taken to prevent this. The decision to have a blood transfusion has been considered carefully by your caregiver before blood is given. Blood is not given unless the benefits outweigh the risks. AFTER THE TRANSFUSION Right after receiving a blood transfusion, you will usually feel much better and more energetic. This is especially true if your red blood cells have gotten low (anemic). The transfusion raises the level of the red blood cells which carry oxygen, and this usually causes an energy increase. The nurse administering the transfusion will monitor you carefully for complications. HOME CARE INSTRUCTIONS  No special instructions are needed after a transfusion. You may find your energy is better. Speak with your caregiver about any limitations on activity for underlying diseases you may have. SEEK MEDICAL CARE IF:  Your condition is not improving after your transfusion. You develop redness or irritation at the intravenous (IV) site. SEEK IMMEDIATE MEDICAL CARE IF:  Any of the following symptoms occur over the next 12 hours: Shaking  chills. You have a temperature by mouth above 102 F (38.9 C), not controlled by medicine. Chest, back, or muscle pain. People around you feel you are not acting correctly or are confused. Shortness of breath or difficulty breathing. Dizziness and fainting. You get a rash or develop hives. You have a decrease in urine output. Your urine turns a dark color or changes to pink, red, or brown. Any of the following symptoms occur over the next 10 days: You have a temperature by mouth above 102 F (38.9 C), not controlled by medicine. Shortness of breath. Weakness after normal activity. The white part of the eye turns yellow (jaundice). You have a decrease in the amount of urine or are urinating less often. Your urine turns a dark color or changes to pink, red, or brown. Document Released: 05/03/2000 Document Revised: 07/29/2011 Document Reviewed: 12/21/2007 [employer] Patient Information 2014 [place], [place].  _______________________________________________________________________  Incentive Spirometer  An incentive spirometer is a tool that can help keep your lungs clear and active. This tool measures how well you are filling your lungs with each breath. Taking long deep breaths may help reverse or decrease the chance of developing breathing (pulmonary) problems (especially infection) following: A long period of time when you are unable to move or be active. BEFORE THE PROCEDURE  If the spirometer includes an indicator to show your best effort, your nurse or respiratory therapist will set it to a desired goal. If possible, sit up straight or lean slightly forward. Try not to slouch. Hold the incentive spirometer in an upright position. INSTRUCTIONS FOR USE  Sit on the edge of your bed if possible, or sit up as far as you can in bed or on a chair. Hold the incentive spirometer in an upright position. Breathe out normally. Place the mouthpiece in your mouth and seal your lips tightly  around it. Breathe in slowly and as deeply as possible, raising the piston or the ball toward the top of the column. Hold your breath for 3-5 seconds or for as long as possible. Allow the piston or ball to fall to the bottom of the column. Remove the mouthpiece from your mouth and breathe out normally. Rest for a few seconds and repeat Steps 1 through 7 at least 10 times every 1-2 hours when you are awake. Take your time and take a few normal breaths between deep breaths. The spirometer may include an indicator to show your best effort. Use the  indicator as a goal to work toward during each repetition. After each set of 10 deep breaths, practice coughing to be sure your lungs are clear. If you have an incision (the cut made at the time of surgery), support your incision when coughing by placing a pillow or rolled up towels firmly against it. Once you are able to get out of bed, walk around indoors and cough well. You may stop using the incentive spirometer when instructed by your caregiver.  RISKS AND COMPLICATIONS Take your time so you do not get dizzy or light-headed. If you are in pain, you may need to take or ask for pain medication before doing incentive spirometry. It is harder to take a deep breath if you are having pain. AFTER USE Rest and breathe slowly and easily. It can be helpful to keep track of a log of your progress. Your caregiver can provide you with a simple table to help with this. If you are using the spirometer at home, follow these instructions: SEEK MEDICAL CARE IF:  You are having difficultly using the spirometer. You have trouble using the spirometer as often as instructed. Your pain medication is not giving enough relief while using the spirometer. You develop fever of 100.5 F (38.1 C) or higher. SEEK IMMEDIATE MEDICAL CARE IF:  You cough up bloody sputum that had not been present before. You develop fever of 102 F (38.9 C) or greater. You develop worsening pain  at or near the incision site. MAKE SURE YOU:  Understand these instructions. Will watch your condition. Will get help right away if you are not doing well or get worse. Document Released: 09/16/2006 Document Revised: 07/29/2011 Document Reviewed: 11/17/2006 [facility] Patient Information 2014 [place], [place].   ________________________________________________________________________

## 2023-09-09 NOTE — Progress Notes (Signed)
 COVID Vaccine Completed:  Date of COVID positive in last 90 days:  PCP - Evander Hills, FNP Cardiologist -   Medical/cardiac clearance by Evander Hills, NP in media tab dated 08/19/23 in Epic  Chest x-ray -  EKG -  Stress Test -  ECHO -  Cardiac Cath -  Pacemaker/ICD device last checked: Spinal Cord Stimulator:  Bowel Prep -   Sleep Study -  CPAP -   Fasting Blood Sugar -  Checks Blood Sugar _____ times a day  Last dose of GLP1 agonist-  Mounjaro , Take on Fridays GLP1 instructions:  Hold 7 days before surgery    Last dose of SGLT-2 inhibitors-  N/A SGLT-2 instructions:  Hold 3 days before surgery    Blood Thinner Instructions:  Last dose:   Time: Aspirin Instructions: Last Dose:  Activity level:  Can go up a flight of stairs and perform activities of daily living without stopping and without symptoms of chest pain or shortness of breath.  Able to exercise without symptoms  Unable to go up a flight of stairs without symptoms of     Anesthesia review:   Patient denies shortness of breath, fever, cough and chest pain at PAT appointment  Patient verbalized understanding of instructions that were given to them at the PAT appointment. Patient was also instructed that they will need to review over the PAT instructions again at home before surgery.

## 2023-09-10 ENCOUNTER — Encounter (HOSPITAL_COMMUNITY)
Admission: RE | Admit: 2023-09-10 | Discharge: 2023-09-10 | Disposition: A | Discharge status: Home / Self Care | Source: Ambulatory Visit | Attending: Orthopedic Surgery | Admitting: Orthopedic Surgery

## 2023-09-10 ENCOUNTER — Encounter (HOSPITAL_COMMUNITY): Payer: Self-pay

## 2023-09-10 ENCOUNTER — Other Ambulatory Visit: Payer: Self-pay

## 2023-09-10 VITALS — BP 151/101 | HR 75 | Temp 98.4°F | Resp 16 | Ht 64.0 in | Wt 238.0 lb

## 2023-09-10 DIAGNOSIS — M1612 Unilateral primary osteoarthritis, left hip: Secondary | ICD-10-CM | POA: Diagnosis not present

## 2023-09-10 DIAGNOSIS — Z6841 Body Mass Index (BMI) 40.0 and over, adult: Secondary | ICD-10-CM | POA: Insufficient documentation

## 2023-09-10 DIAGNOSIS — E669 Obesity, unspecified: Secondary | ICD-10-CM | POA: Diagnosis not present

## 2023-09-10 DIAGNOSIS — J45909 Unspecified asthma, uncomplicated: Secondary | ICD-10-CM | POA: Diagnosis not present

## 2023-09-10 DIAGNOSIS — E039 Hypothyroidism, unspecified: Secondary | ICD-10-CM | POA: Diagnosis not present

## 2023-09-10 DIAGNOSIS — K219 Gastro-esophageal reflux disease without esophagitis: Secondary | ICD-10-CM | POA: Insufficient documentation

## 2023-09-10 DIAGNOSIS — E119 Type 2 diabetes mellitus without complications: Secondary | ICD-10-CM | POA: Insufficient documentation

## 2023-09-10 DIAGNOSIS — Z01818 Encounter for other preprocedural examination: Secondary | ICD-10-CM | POA: Diagnosis not present

## 2023-09-10 DIAGNOSIS — I10 Essential (primary) hypertension: Secondary | ICD-10-CM | POA: Diagnosis not present

## 2023-09-10 DIAGNOSIS — G4733 Obstructive sleep apnea (adult) (pediatric): Secondary | ICD-10-CM | POA: Insufficient documentation

## 2023-09-10 HISTORY — DX: Prediabetes: R73.03

## 2023-09-10 LAB — CBC
HCT: 37.4 % (ref 36.0–46.0)
Hemoglobin: 11.9 g/dL — ABNORMAL LOW (ref 12.0–15.0)
MCH: 29.5 pg (ref 26.0–34.0)
MCHC: 31.8 g/dL (ref 30.0–36.0)
MCV: 92.8 fL (ref 80.0–100.0)
Platelets: 298 10*3/uL (ref 150–400)
RBC: 4.03 MIL/uL (ref 3.87–5.11)
RDW: 13.2 % (ref 11.5–15.5)
WBC: 5.3 10*3/uL (ref 4.0–10.5)
nRBC: 0 % (ref 0.0–0.2)

## 2023-09-10 LAB — BASIC METABOLIC PANEL WITH GFR
Anion gap: 8 (ref 5–15)
BUN: 10 mg/dL (ref 6–20)
CO2: 26 mmol/L (ref 22–32)
Calcium: 9.6 mg/dL (ref 8.9–10.3)
Chloride: 104 mmol/L (ref 98–111)
Creatinine, Ser: 1.01 mg/dL — ABNORMAL HIGH (ref 0.44–1.00)
GFR, Estimated: >60 mL/min (ref >60)
Glucose, Bld: 82 mg/dL (ref 70–99)
Potassium: 4 mmol/L (ref 3.5–5.1)
Sodium: 138 mmol/L (ref 135–145)

## 2023-09-10 LAB — TYPE AND SCREEN
ABO/RH(D): O POS
Antibody Screen: NEGATIVE

## 2023-09-10 LAB — HEMOGLOBIN A1C
Hgb A1c MFr Bld: 4.9 % (ref 4.8–5.6)
Mean Plasma Glucose: 93.93 mg/dL

## 2023-09-10 LAB — SURGICAL PCR SCREEN
MRSA, PCR: NEGATIVE
Staphylococcus aureus: NEGATIVE

## 2023-09-11 NOTE — H&P (Signed)
 HIP ARTHROPLASTY ADMISSION H&P  Patient ID: Candice Watkins MRN: [id number] DOB/AGE: [birth year] 52 y.o.  Chief Complaint: left hip pain.  Planned Procedure Date: 09/23/23 Medical and Cardiac Clearance by Evander Hills NP     HPI: Candice Watkins is a 53 y.o. 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 The patient has a history of pain and functional disability in the left hip due to arthritis and has failed non-surgical conservative treatments for greater than 12 weeks to include NSAID's and/or analgesics, corticosteriod injections, supervised PT with diminished ADL's post treatment, use of assistive devices, weight reduction as appropriate, and activity modification.  Onset of symptoms was gradual, starting 1 year ago with rapidlly worsening course since that time. The patient noted no past surgery on the left hip.  Patient currently rates pain at 10 out of 10 with activity. Patient has night pain, worsening of pain with activity and weight bearing, and pain that interferes with activities of daily living.  Patient has evidence of subchondral cysts, subchondral sclerosis, periarticular osteophytes, and joint space narrowing by imaging studies.  There is no active infection.  Past Medical History:  Diagnosis Date   Anxiety    Arthritis    "knees, ankles" (10/11/2015)   Asthma    Depression    Diabetes mellitus without complication (HCC)    Heartburn    HLD (hyperlipidemia)    Hypertension    Hypothyroidism    Kidney stones    Migraine    "monthly" (10/11/2015)   PONV (postoperative nausea and vomiting) X 1   Pre-diabetes    Primary hyperparathyroidism (HCC)    Seasonal allergies    Sleep apnea    Stomach ulcer    Past Surgical History:  Procedure Laterality Date   BREAST BIOPSY Bilateral    neg- core   CESAREAN SECTION  2000   COLONOSCOPY WITH PROPOFOL  N/A 05/02/2022   Procedure: COLONOSCOPY WITH PROPOFOL ;  Surgeon: Selena Daily, MD;  Location: [facility]  ENDOSCOPY;  Service: Gastroenterology;  Laterality: N/A;   COLONOSCOPY WITH PROPOFOL  N/A 07/22/2022   Procedure: COLONOSCOPY WITH PROPOFOL ;  Surgeon: Selena Daily, MD;  Location: [facility] ENDOSCOPY;  Service: Gastroenterology;  Laterality: N/A;   ESOPHAGOGASTRODUODENOSCOPY (EGD) WITH PROPOFOL  N/A 05/02/2022   Procedure: ESOPHAGOGASTRODUODENOSCOPY (EGD) WITH PROPOFOL ;  Surgeon: Selena Daily, MD;  Location: [facility] ENDOSCOPY;  Service: Gastroenterology;  Laterality: N/A;   JOINT REPLACEMENT     KNEE ARTHROSCOPY Right    THYROID  SURGERY     "removed tumor"   TOTAL KNEE ARTHROPLASTY Right 10/11/2015   TOTAL KNEE ARTHROPLASTY Right 10/11/2015   Procedure: TOTAL KNEE ARTHROPLASTY;  Surgeon: Ferd Householder, MD;  Location: [facility] OR;  Service: Orthopedics;  Laterality: Right;   TUBAL LIGATION     VAGINAL HYSTERECTOMY     Allergies  Allergen Reactions   Ibuprofen  Other (See Comments)    History of Stomach Ulcers   Metronidazole Swelling and Other (See Comments)    EDEMA   Phenyltoloxamine-Acetaminophen  Hives   Other Nausea And Vomiting    Cogesic   Septra [Sulfamethoxazole-Trimethoprim] Nausea And Vomiting   Prior to Admission medications   Medication Sig Start Date End Date Taking? Authorizing Provider  aspirin-acetaminophen -caffeine (EXCEDRIN MIGRAINE) 250-250-65 MG tablet Take 1 tablet by mouth 2 (two) times daily as needed for migraine.   Yes [provider]  Azelastine HCl 137 MCG/SPRAY SOLN SPRAY 1 SPRAY INTO EACH NOSTRIL TWICE A DAY Patient taking differently: Place 1 spray into both nostrils 2 (two) times  daily as needed (allergies). 05/20/23  Yes Trenda Frisk, FNP  budesonide-formoterol (SYMBICORT) 80-4.5 MCG/ACT inhaler TAKE 2 PUFFS BY MOUTH TWICE A DAY 03/24/23  Yes Trenda Frisk, FNP  cetirizine (ZYRTEC) 10 MG tablet Take 10 mg by mouth daily as needed for allergies.   Yes [provider]  hydrOXYzine  (VISTARIL ) 25 MG capsule ONCE A DAY AS NEEDED FOR  ANXIETY Patient taking differently: Take 25 mg by mouth every evening. 05/12/23  Yes Trenda Frisk, FNP  ibuprofen  (ADVIL ) 800 MG tablet Take 1 tablet (800 mg total) by mouth every 8 (eight) hours as needed. 10/12/22  Yes Trenda Frisk, FNP  lactulose  (CHRONULAC ) 10 GM/15ML solution TAKE 30 MLS (20 G TOTAL) BY MOUTH DAILY AS NEEDED FOR MODERATE CONSTIPATION OR SEVERE CONSTIPATION. 08/20/23  Yes Trenda Frisk, FNP  levothyroxine  (SYNTHROID ) 112 MCG tablet TAKE 1 TABLET BY MOUTH EVERY MORNING AT LEAST 30 MINUTES BEFORE FOOD OR OTHER MEDS. 02/27/23  Yes Trenda Frisk, FNP  metoprolol  succinate (TOPROL -XL) 50 MG 24 hr tablet TAKE 1 TABLET BY MOUTH EVERY DAY Patient taking differently: Take 50 mg by mouth every evening. 06/26/23  Yes Trenda Frisk, FNP  rosuvastatin (CRESTOR) 10 MG tablet TAKE 1 TABLET BY MOUTH EVERY DAY Patient taking differently: Take 10 mg by mouth every evening. 08/11/23  Yes Trenda Frisk, FNP  tirzepatide  (MOUNJARO ) 7.5 MG/0.5ML Pen INJECT 7.5 MG SUBCUTANEOUSLY WEEKLY Patient taking differently: Inject 7.5 mg into the skin every Friday. 08/20/23  Yes Trenda Frisk, FNP  traZODone  (DESYREL ) 50 MG tablet TAKE 1 TABLET BY MOUTH EVERYDAY AT BEDTIME 08/11/23  Yes Trenda Frisk, FNP  TRINTELLIX  20 MG TABS tablet Take 20 mg by mouth in the morning. 11/30/21  Yes [provider]  valsartan -hydrochlorothiazide  (DIOVAN  HCT) 160-12.5 MG tablet Take 1 tablet by mouth daily. 06/18/23  Yes Trenda Frisk, FNP   Social History   Socioeconomic History   Marital status: Single    Spouse name: Not on file   Number of children: Not on file   Years of education: Not on file   Highest education level: Not on file  Occupational History   Not on file  Tobacco Use   Smoking status: Never   Smokeless tobacco: Never  Vaping Use   Vaping status: Never Used  Substance and Sexual Activity   Alcohol use: Not Currently    Comment: occ   Drug use: No   Sexual  activity: Yes    Birth control/protection: None    Comment: hysterectomy  Other Topics Concern   Not on file  Social History Narrative   Not on file   Social Drivers of Health   Financial Resource Strain: Not on file  Food Insecurity: Not on file  Transportation Needs: Not on file  Physical Activity: Not on file  Stress: Not on file  Social Connections: Not on file   Family History  Problem Relation Age of Onset   Breast cancer Neg Hx    Kidney disease Neg Hx     ROS: Currently denies lightheadedness, dizziness, Fever, chills, CP, SOB.   No personal history of DVT, PE, MI, or CVA. No loose teeth + Partial dentures present All other systems have been reviewed and were otherwise currently negative with the exception of those mentioned in the HPI and as above.  Objective: Vitals: Ht: 5'4" Wt: 234.6 lbs Temp: 98.1 BP: 137/81 Pulse: 101 O2 94% on room air.   Physical Exam: General: Alert,  NAD. Trendelenberg Gait  HEENT: EOMI, Good Neck Extension Pulm: No increased work of breathing.  Clear B/L A/P w/o crackle or wheeze.  CV: Tachycardiac rate and RR, No m/g/r appreciated  GI: soft, NT, ND. BS x 4 quadrants Neuro: CN II-XII grossly intact without focal deficit.  Sensation intact distally Skin: No lesions in the area of chief complaint MSK/Surgical Site: + TTP. Hip ROM decreased d/t pain. + Stinchfield. - SLR. + FABER/FADIR. 4/5 strength.  NVI.    Imaging Review Plain radiographs demonstrate severe degenerative joint disease of the left hip.   The bone quality appears to be fair for age and reported activity level.  Preoperative templating of the joint replacement has been completed, documented, and submitted to the Operating Room personnel in order to optimize intra-operative equipment management.  Assessment: OA LEFT HIP Active Problems:   * No active hospital problems. *   Plan: Plan for Procedure(s): ARTHROPLASTY, HIP, TOTAL, ANTERIOR APPROACH  The patient  history, physical exam, clinical judgement of the provider and imaging are consistent with end stage degenerative joint disease and total joint arthroplasty is deemed medically necessary. The treatment options including medical management, injection therapy, and arthroplasty were discussed at length. The risks and benefits of Procedure(s): ARTHROPLASTY, HIP, TOTAL, ANTERIOR APPROACH were presented and reviewed.  The risks of nonoperative treatment, versus surgical intervention including but not limited to continued pain, aseptic loosening, stiffness, dislocation/subluxation, infection, bleeding, nerve injury, blood clots, cardiopulmonary complications, morbidity, mortality, among others were discussed. The patient verbalizes understanding and wishes to proceed with the plan.  Patient is being admitted for surgery, pain control, PT, prophylactic antibiotics, VTE prophylaxis, progressive ambulation, ADL's and discharge planning.   Dental prophylaxis discussed and recommended for 2 years postoperatively.  The patient does meet the criteria for TXA which will be used perioperatively.   ASA 81 mg BID will be used postoperatively for DVT prophylaxis in addition to SCDs, and early ambulation. Plan for Oxycodone , Mobic, Tylenol  for pain.   Robaxin  for muscle spasm.  Zofran  for nausea and vomiting. Lactulose  is for constipation prevention which she already has at home. Pharmacy- [facility] [address] in [place] The patient is planning to be discharged home with OPPT and into the care of her cousin Abron Hoist who can be reached at [phone] Follow up appt 10/08/23 at 4:15pm     Ander Bame Office [phone] 09/11/2023 5:20 PM

## 2023-09-15 ENCOUNTER — Encounter (HOSPITAL_COMMUNITY): Payer: Self-pay

## 2023-09-15 NOTE — Progress Notes (Signed)
 Case: [id number] Date/Time: 09/23/23 0715   Procedure: ARTHROPLASTY, HIP, TOTAL, ANTERIOR APPROACH (Left: Hip)   Anesthesia type: Spinal   Pre-op diagnosis: OA LEFT HIP   [facility] ROOM 07 / [facility]   Surgeons: [name], MD       DISCUSSION: Candice Watkins is a 53 yo female who presents to PAT prior to surgery above. PMH of HTN, OSA (uses CPAP), asthma, GERD, PUD, hypothyroid, anxiety, depression, obesity (BMI 40)  Prior anesthesia complication includes PONV  Patient follows with PCP. Clearance signed that patient is optimized from medical/cardiac perspective (scanned in media on 08/19/23). Last seen in clinic on 06/18/23. No acute issues noted.  LD Mounjaro : 4/25  VS: BP (!) 151/101   Pulse 75   Temp 36.9 C (Oral)   Resp 16   Ht 5\' 4"  (1.626 m)   Wt 108 kg   SpO2 98%   BMI 40.85 kg/m   PROVIDERS: Trenda Frisk, FNP   LABS: Labs reviewed: Acceptable for surgery. (all labs ordered are listed, but only abnormal results are displayed)  Labs Reviewed  BASIC METABOLIC PANEL WITH GFR - Abnormal; Notable for the following components:      Result Value   Creatinine, Ser 1.01 (*)    All other components within normal limits  CBC - Abnormal; Notable for the following components:   Hemoglobin 11.9 (*)    All other components within normal limits  SURGICAL PCR SCREEN  HEMOGLOBIN A1C  TYPE AND SCREEN     IMAGES:   EKG 09/10/23:  Normal sinus rhythm, rate 69 Minimal voltage criteria for LVH, may be normal variant ( R in aVL ) Poor R wave progression   CV:  Past Medical History:  Diagnosis Date   Anxiety    Arthritis    "knees, ankles" (10/11/2015)   Asthma    Depression    Diabetes mellitus without complication (HCC)    Heartburn    HLD (hyperlipidemia)    Hypertension    Hypothyroidism    Kidney stones    Migraine    "monthly" (10/11/2015)   PONV (postoperative nausea and vomiting) X 1   Pre-diabetes    Primary hyperparathyroidism (HCC)     Seasonal allergies    Sleep apnea    Stomach ulcer     Past Surgical History:  Procedure Laterality Date   BREAST BIOPSY Bilateral    neg- core   CESAREAN SECTION  2000   COLONOSCOPY WITH PROPOFOL  N/A 05/02/2022   Procedure: COLONOSCOPY WITH PROPOFOL ;  Surgeon: Selena Daily, MD;  Location: [facility] ENDOSCOPY;  Service: Gastroenterology;  Laterality: N/A;   COLONOSCOPY WITH PROPOFOL  N/A 07/22/2022   Procedure: COLONOSCOPY WITH PROPOFOL ;  Surgeon: Selena Daily, MD;  Location: [facility] ENDOSCOPY;  Service: Gastroenterology;  Laterality: N/A;   ESOPHAGOGASTRODUODENOSCOPY (EGD) WITH PROPOFOL  N/A 05/02/2022   Procedure: ESOPHAGOGASTRODUODENOSCOPY (EGD) WITH PROPOFOL ;  Surgeon: Selena Daily, MD;  Location: [facility] ENDOSCOPY;  Service: Gastroenterology;  Laterality: N/A;   JOINT REPLACEMENT     KNEE ARTHROSCOPY Right    THYROID  SURGERY     "removed tumor"   TOTAL KNEE ARTHROPLASTY Right 10/11/2015   TOTAL KNEE ARTHROPLASTY Right 10/11/2015   Procedure: TOTAL KNEE ARTHROPLASTY;  Surgeon: Ferd Householder, MD;  Location: [facility] OR;  Service: Orthopedics;  Laterality: Right;   TUBAL LIGATION     VAGINAL HYSTERECTOMY      MEDICATIONS:  aspirin-acetaminophen -caffeine (EXCEDRIN MIGRAINE) 250-250-65 MG tablet   Azelastine HCl 137 MCG/SPRAY SOLN   budesonide-formoterol (  SYMBICORT) 80-4.5 MCG/ACT inhaler   cetirizine (ZYRTEC) 10 MG tablet   hydrOXYzine  (VISTARIL ) 25 MG capsule   ibuprofen  (ADVIL ) 800 MG tablet   lactulose  (CHRONULAC ) 10 GM/15ML solution   levothyroxine  (SYNTHROID ) 112 MCG tablet   metoprolol  succinate (TOPROL -XL) 50 MG 24 hr tablet   rosuvastatin (CRESTOR) 10 MG tablet   tirzepatide  (MOUNJARO ) 7.5 MG/0.5ML Pen   traZODone  (DESYREL ) 50 MG tablet   TRINTELLIX  20 MG TABS tablet   valsartan -hydrochlorothiazide  (DIOVAN  HCT) 160-12.5 MG tablet   No current facility-administered medications for this encounter.   Antoinette Kirschner [facility] Short  Stay/Anesthesiology [facility] Phone [phone] 09/15/2023 3:03 PM

## 2023-09-15 NOTE — Anesthesia Preprocedure Evaluation (Addendum)
 Anesthesia Evaluation  Patient identified by MRN, date of birth, ID band Patient awake    Reviewed: Allergy & Precautions, NPO status , Patient's Chart, lab work & pertinent test results, reviewed documented beta blocker date and time   History of Anesthesia Complications (+) PONV and history of anesthetic complications  Airway Mallampati: II  TM Distance: >3 FB Neck ROM: Full    Dental  (+) Missing,    Pulmonary asthma , sleep apnea    Pulmonary exam normal        Cardiovascular hypertension, Pt. on medications and Pt. on home beta blockers Normal cardiovascular exam     Neuro/Psych  Headaches  Anxiety Depression       GI/Hepatic Neg liver ROS, PUD,,,  Endo/Other  diabetesHypothyroidism  Class 3 obesity  Renal/GU negative Renal ROS     Musculoskeletal  (+) Arthritis ,    Abdominal   Peds  Hematology negative hematology ROS (+)   Anesthesia Other Findings Day of surgery medications reviewed with patient.  Reproductive/Obstetrics                             Anesthesia Physical Anesthesia Plan  ASA: 3  Anesthesia Plan: Spinal   Post-op Pain Management: Tylenol  PO (pre-op)*   Induction:   PONV Risk Score and Plan: 4 or greater and Treatment may vary due to age or medical condition, Ondansetron , Propofol  infusion, Dexamethasone and Midazolam   Airway Management Planned: Natural Airway and Simple Face Mask  Additional Equipment: None  Intra-op Plan:   Post-operative Plan:   Informed Consent: I have reviewed the patients History and Physical, chart, labs and discussed the procedure including the risks, benefits and alternatives for the proposed anesthesia with the patient or authorized representative who has indicated his/her understanding and acceptance.       Plan Discussed with: CRNA  Anesthesia Plan Comments: (See PAT note from 4/23)        Anesthesia Quick  Evaluation

## 2023-09-16 NOTE — Care Plan (Signed)
 Ortho Bundle Case Management Note  Patient Details  Name: Candice Watkins MRN: [id number] Date of Birth: Apr 28, 1971  met with patient in the office for H&P. will discharge to home with family to help.rolling walker ordered for home. [facility] discharge instructions discussed and questions answered. Patient and MD in agreement with plan. Choice offered.             DME Arranged:  Otho Blitz rolling DME Agency:  [employer]  HH Arranged:    HH Agency:     Additional Comments: Please contact me with any questions of if this plan should need to change.  Cornelia Dieter,  RN,BSN,MHA,CCM  [facility]opaedic Specialist  [phone] 09/16/2023, 1:33 PM

## 2023-09-18 DIAGNOSIS — E139 Other specified diabetes mellitus without complications: Secondary | ICD-10-CM | POA: Diagnosis not present

## 2023-09-18 DIAGNOSIS — H524 Presbyopia: Secondary | ICD-10-CM | POA: Diagnosis not present

## 2023-09-19 ENCOUNTER — Other Ambulatory Visit: Payer: Self-pay | Admitting: Family

## 2023-09-22 NOTE — Discharge Instructions (Signed)
 You may bear weight as tolerated. Keep your dressing on and dry until follow up. Take medicine to prevent blood clots as directed. Take pain medicine as needed with the goal of transitioning to over the counter medicines. It is okay to take 2 tablets of your narcotic medicine instead of just 1 tablet in the first 1-2 days after surgery when pain is at the worst. Do not continue to do this for multiple days at a time beyond that though.    INSTRUCTIONS AFTER JOINT REPLACEMENT   Remove items at home which could result in a fall. This includes throw rugs or furniture in walking pathways ICE to the affected joint every three hours while awake for 30 minutes at a time, for at least the first 3-5 days, and then as needed for pain and swelling.  Continue to use ice for pain and swelling. You may notice swelling that will progress down to the foot and ankle.  This is normal after surgery.  Elevate your leg when you are not up walking on it.   Continue to use the breathing machine you got in the hospital (incentive spirometer) which will help keep your temperature down.  It is common for your temperature to cycle up and down following surgery, especially at night when you are not up moving around and exerting yourself.  The breathing machine keeps your lungs expanded and your temperature down.   DIET:  As you were doing prior to hospitalization, we recommend a well-balanced diet.  DRESSING / WOUND CARE / SHOWERING  You may shower 3 days after surgery, but keep the wounds dry during showering.  You may use an occlusive plastic wrap (Press'n Seal for example) with blue painter's tape at edges, NO SOAKING/SUBMERGING IN THE BATHTUB.  If the bandage gets wet, call the office.   ACTIVITY  Increase activity slowly as tolerated, but follow the weight bearing instructions below.   No driving for 6 weeks or until further direction given by your physician.  You cannot drive while taking narcotics.  No lifting or  carrying greater than 10 lbs. until further directed by your surgeon. Avoid periods of inactivity such as sitting longer than an hour when not asleep. This helps prevent blood clots.  You may return to work once you are authorized by your doctor.    WEIGHT BEARING   Weight bearing as tolerated with assist device (walker, cane, etc) as directed, use it as long as suggested by your surgeon or therapist, typically at least 4-6 weeks.   EXERCISES  Results after joint replacement surgery are often greatly improved when you follow the exercise, range of motion and muscle strengthening exercises prescribed by your doctor. Safety measures are also important to protect the joint from further injury. Any time any of these exercises cause you to have increased pain or swelling, decrease what you are doing until you are comfortable again and then slowly increase them. If you have problems or questions, call your caregiver or physical therapist for advice.   Rehabilitation is important following a joint replacement. After just a few days of immobilization, the muscles of the leg can become weakened and shrink (atrophy).  These exercises are designed to build up the tone and strength of the thigh and leg muscles and to improve motion. Often times heat used for twenty to thirty minutes before working out will loosen up your tissues and help with improving the range of motion but do not use heat for the first two  weeks following surgery (sometimes heat can increase post-operative swelling).   These exercises can be done on a training (exercise) mat, on the floor, on a table or on a bed. Use whatever works the best and is most comfortable for you.    Use music or television while you are exercising so that the exercises are a pleasant break in your day. This will make your life better with the exercises acting as a break in your routine that you can look forward to.   Perform all exercises about fifteen times, three  times per day or as directed.  You should exercise both the operative leg and the other leg as well.  Exercises include:   Quad Sets - Tighten up the muscle on the front of the thigh (Quad) and hold for 5-10 seconds.   Straight Leg Raises - With your knee straight (if you were given a brace, keep it on), lift the leg to 60 degrees, hold for 3 seconds, and slowly lower the leg.  Perform this exercise against resistance later as your leg gets stronger.  Leg Slides: Lying on your back, slowly slide your foot toward your buttocks, bending your knee up off the floor (only go as far as is comfortable). Then slowly slide your foot back down until your leg is flat on the floor again.  Angel Wings: Lying on your back spread your legs to the side as far apart as you can without causing discomfort.  Hamstring Strength:  Lying on your back, push your heel against the floor with your leg straight by tightening up the muscles of your buttocks.  Repeat, but this time bend your knee to a comfortable angle, and push your heel against the floor.  You may put a pillow under the heel to make it more comfortable if necessary.   A rehabilitation program following joint replacement surgery can speed recovery and prevent re-injury in the future due to weakened muscles. Contact your doctor or a physical therapist for more information on knee rehabilitation.    CONSTIPATION  Constipation is defined medically as fewer than three stools per week and severe constipation as less than one stool per week.  Even if you have a regular bowel pattern at home, your normal regimen is likely to be disrupted due to multiple reasons following surgery.  Combination of anesthesia, postoperative narcotics, change in appetite and fluid intake all can affect your bowels.   YOU MUST use at least one of the following options; they are listed in order of increasing strength to get the job done.  They are all available over the counter, and you may  need to use some, POSSIBLY even all of these options:    Drink plenty of fluids (prune juice may be helpful) and high fiber foods Colace 100 mg by mouth twice a day  Senokot for constipation as directed and as needed Dulcolax (bisacodyl ), take with full glass of water  Miralax (polyethylene glycol) once or twice a day as needed.  If you have tried all these things and are unable to have a bowel movement in the first 3-4 days after surgery call either your surgeon or your primary doctor.    If you experience loose stools or diarrhea, hold the medications until you stool forms back up.  If your symptoms do not get better within 1 week or if they get worse, check with your doctor.  If you experience "the worst abdominal pain ever" or develop nausea or vomiting, please  contact the office immediately for further recommendations for treatment.   ITCHING:  If you experience itching with your medications, try taking only a single pain pill, or even half a pain pill at a time.  You can also use Benadryl  over the counter for itching or also to help with sleep.   TED HOSE STOCKINGS:  Use stockings on both legs until for at least 2 weeks or as directed by physician office. They may be removed at night for sleeping.  MEDICATIONS:  See your medication summary on the "After Visit Summary" that nursing will review with you.  You may have some home medications which will be placed on hold until you complete the course of blood thinner medication.  It is important for you to complete the blood thinner medication as prescribed.  Take medicines as prescribed.   You have several different medicines that work in different ways. - Tylenol  is for mild to moderate pain. Try to take this medicine before turning to your narcotic medicines.  - Meloxicam is to reduce pain / inflammation - Robaxin  is for muscle spasms. This medicine can make you drowsy. - Oxycodone  is a narcotic pain medicine.  Take this for severe pain.  This medicine can be dehydrating / constipating. - Zofran  is for nausea and vomiting. - Colace Miralax Senokot is for constipation prevention while taking pain medicine.  - Aspirin is to prevent blood clots after surgery. YOU MUST TAKE THIS MEDICINE!!  PRECAUTIONS:  If you experience chest pain or shortness of breath - call 911 immediately for transfer to the hospital emergency department.   If you develop a fever greater that 101 F, purulent drainage from wound, increased redness or drainage from wound, foul odor from the wound/dressing, or calf pain - CONTACT YOUR SURGEON.                                                   FOLLOW-UP APPOINTMENTS:  If you do not already have a post-op appointment, please call the office [phone] for an appointment to be seen by Dr. Abigail Abler in 2 weeks.   OTHER INSTRUCTIONS:   MAKE SURE YOU:  Understand these instructions.  Get help right away if you are not doing well or get worse.    Thank you for letting us  be a part of your medical care team.  It is a privilege we respect greatly.  We hope these instructions will help you stay on track for a fast and full recovery!

## 2023-09-23 ENCOUNTER — Ambulatory Visit (HOSPITAL_COMMUNITY)

## 2023-09-23 ENCOUNTER — Ambulatory Visit (HOSPITAL_BASED_OUTPATIENT_CLINIC_OR_DEPARTMENT_OTHER): Admitting: Anesthesiology

## 2023-09-23 ENCOUNTER — Ambulatory Visit (HOSPITAL_COMMUNITY): Payer: Self-pay | Admitting: Physician Assistant

## 2023-09-23 ENCOUNTER — Encounter (HOSPITAL_COMMUNITY)
Admission: RE | Disposition: A | Discharge status: Home / Self Care | Payer: Self-pay | Source: Home / Self Care | Attending: Orthopedic Surgery

## 2023-09-23 ENCOUNTER — Encounter (HOSPITAL_COMMUNITY): Payer: Self-pay | Admitting: Orthopedic Surgery

## 2023-09-23 ENCOUNTER — Other Ambulatory Visit: Payer: Self-pay

## 2023-09-23 ENCOUNTER — Ambulatory Visit (HOSPITAL_COMMUNITY)
Admission: RE | Admit: 2023-09-23 | Discharge: 2023-09-24 | Disposition: A | Discharge status: Home / Self Care | Attending: Orthopedic Surgery | Admitting: Orthopedic Surgery

## 2023-09-23 DIAGNOSIS — I10 Essential (primary) hypertension: Secondary | ICD-10-CM | POA: Insufficient documentation

## 2023-09-23 DIAGNOSIS — J45909 Unspecified asthma, uncomplicated: Secondary | ICD-10-CM | POA: Insufficient documentation

## 2023-09-23 DIAGNOSIS — E119 Type 2 diabetes mellitus without complications: Secondary | ICD-10-CM | POA: Diagnosis not present

## 2023-09-23 DIAGNOSIS — E669 Obesity, unspecified: Secondary | ICD-10-CM | POA: Insufficient documentation

## 2023-09-23 DIAGNOSIS — G4730 Sleep apnea, unspecified: Secondary | ICD-10-CM | POA: Diagnosis not present

## 2023-09-23 DIAGNOSIS — F32A Depression, unspecified: Secondary | ICD-10-CM | POA: Diagnosis not present

## 2023-09-23 DIAGNOSIS — Z6841 Body Mass Index (BMI) 40.0 and over, adult: Secondary | ICD-10-CM | POA: Insufficient documentation

## 2023-09-23 DIAGNOSIS — Z79899 Other long term (current) drug therapy: Secondary | ICD-10-CM | POA: Insufficient documentation

## 2023-09-23 DIAGNOSIS — F419 Anxiety disorder, unspecified: Secondary | ICD-10-CM | POA: Insufficient documentation

## 2023-09-23 DIAGNOSIS — E039 Hypothyroidism, unspecified: Secondary | ICD-10-CM | POA: Diagnosis not present

## 2023-09-23 DIAGNOSIS — M1612 Unilateral primary osteoarthritis, left hip: Secondary | ICD-10-CM | POA: Diagnosis not present

## 2023-09-23 DIAGNOSIS — Z96642 Presence of left artificial hip joint: Secondary | ICD-10-CM

## 2023-09-23 HISTORY — PX: TOTAL HIP ARTHROPLASTY: SHX124

## 2023-09-23 SURGERY — ARTHROPLASTY, HIP, TOTAL, ANTERIOR APPROACH
Anesthesia: Spinal | Site: Hip | Laterality: Left

## 2023-09-23 MED ORDER — ONDANSETRON HCL 4 MG PO TABS: 4.0000 mg | ORAL_TABLET | Freq: Four times a day (QID) | ORAL | Status: DC | PRN

## 2023-09-23 MED ORDER — LACTATED RINGERS IV BOLUS: 500.0000 mL | Freq: Once | INTRAVENOUS | Status: DC

## 2023-09-23 MED ORDER — DEXAMETHASONE SODIUM PHOSPHATE 10 MG/ML IJ SOLN
INTRAMUSCULAR | Status: DC | PRN
Start: 2023-09-23 — End: 2023-09-23
  Administered 2023-09-23: 10 mg via INTRAVENOUS

## 2023-09-23 MED ORDER — OXYCODONE HCL 5 MG PO TABS
10.0000 mg | ORAL_TABLET | ORAL | Status: DC | PRN
  Administered 2023-09-23 – 2023-09-24 (×2): 10 mg via ORAL
  Filled 2023-09-23 (×2): qty 2

## 2023-09-23 MED ORDER — DOCUSATE SODIUM 100 MG PO CAPS
100.0000 mg | ORAL_CAPSULE | Freq: Two times a day (BID) | ORAL | Status: DC
  Administered 2023-09-23 – 2023-09-24 (×2): 100 mg via ORAL
  Filled 2023-09-23 (×2): qty 1

## 2023-09-23 MED ORDER — CHLORHEXIDINE GLUCONATE 0.12 % MT SOLN
15.0000 mL | Freq: Once | OROMUCOSAL | Status: AC
  Administered 2023-09-23: 15 mL via OROMUCOSAL

## 2023-09-23 MED ORDER — LIDOCAINE HCL (PF) 2 % IJ SOLN
INTRAMUSCULAR | Status: DC | PRN
  Administered 2023-09-23: 100 mg via INTRADERMAL

## 2023-09-23 MED ORDER — ACETAMINOPHEN 500 MG PO TABS
1000.0000 mg | ORAL_TABLET | Freq: Four times a day (QID) | ORAL | Status: DC
  Administered 2023-09-23 – 2023-09-24 (×3): 1000 mg via ORAL
  Filled 2023-09-23 (×3): qty 2

## 2023-09-23 MED ORDER — MIDAZOLAM HCL 2 MG/2ML IJ SOLN
INTRAMUSCULAR | Status: DC | PRN
Start: 2023-09-23 — End: 2023-09-23
  Administered 2023-09-23 (×2): 1 mg via INTRAVENOUS

## 2023-09-23 MED ORDER — ONDANSETRON HCL 4 MG/2ML IJ SOLN
4.0000 mg | Freq: Four times a day (QID) | INTRAMUSCULAR | Status: DC | PRN
  Filled 2023-09-23: qty 2

## 2023-09-23 MED ORDER — MELOXICAM 15 MG PO TABS: 15.0000 mg | ORAL_TABLET | Freq: Every day | ORAL | 0 refills | Status: AC

## 2023-09-23 MED ORDER — POVIDONE-IODINE 10 % EX SWAB: 2.0000 | Freq: Once | CUTANEOUS | Status: DC

## 2023-09-23 MED ORDER — BUPIVACAINE LIPOSOME 1.3 % IJ SUSP
INTRAMUSCULAR | Status: AC
  Filled 2023-09-23: qty 10

## 2023-09-23 MED ORDER — FENTANYL CITRATE PF 50 MCG/ML IJ SOSY
PREFILLED_SYRINGE | INTRAMUSCULAR | Status: AC
  Filled 2023-09-23: qty 1

## 2023-09-23 MED ORDER — ASPIRIN 325 MG PO TBEC
325.0000 mg | DELAYED_RELEASE_TABLET | Freq: Every day | ORAL | Status: DC
  Administered 2023-09-24: 325 mg via ORAL
  Filled 2023-09-23: qty 1

## 2023-09-23 MED ORDER — HYDROMORPHONE HCL 1 MG/ML IJ SOLN
INTRAMUSCULAR | Status: AC
  Filled 2023-09-23: qty 1

## 2023-09-23 MED ORDER — OXYCODONE HCL 5 MG PO TABS
5.0000 mg | ORAL_TABLET | ORAL | Status: DC | PRN
  Administered 2023-09-23 (×2): 10 mg via ORAL
  Filled 2023-09-23 (×2): qty 2

## 2023-09-23 MED ORDER — HYDROMORPHONE HCL 1 MG/ML IJ SOLN: 0.5000 mg | INTRAMUSCULAR | Status: DC | PRN

## 2023-09-23 MED ORDER — DEXAMETHASONE SODIUM PHOSPHATE 10 MG/ML IJ SOLN
INTRAMUSCULAR | Status: AC
  Filled 2023-09-23: qty 1

## 2023-09-23 MED ORDER — OXYCODONE HCL 5 MG PO TABS
5.0000 mg | ORAL_TABLET | Freq: Once | ORAL | Status: AC | PRN
  Administered 2023-09-23: 5 mg via ORAL

## 2023-09-23 MED ORDER — ONDANSETRON HCL 4 MG/2ML IJ SOLN
INTRAMUSCULAR | Status: DC | PRN
  Administered 2023-09-23: 4 mg via INTRAVENOUS

## 2023-09-23 MED ORDER — ORAL CARE MOUTH RINSE: 15.0000 mL | Freq: Once | OROMUCOSAL | Status: AC

## 2023-09-23 MED ORDER — LACTATED RINGERS IV SOLN: INTRAVENOUS | Status: DC

## 2023-09-23 MED ORDER — HYDROMORPHONE HCL 1 MG/ML IJ SOLN
0.2500 mg | INTRAMUSCULAR | Status: DC | PRN
  Administered 2023-09-23 (×3): 0.5 mg via INTRAVENOUS

## 2023-09-23 MED ORDER — OXYCODONE HCL 5 MG PO TABS: ORAL_TABLET | ORAL | 0 refills | Status: AC

## 2023-09-23 MED ORDER — ALUM & MAG HYDROXIDE-SIMETH 200-200-20 MG/5ML PO SUSP: 30.0000 mL | ORAL | Status: DC | PRN

## 2023-09-23 MED ORDER — ACETAMINOPHEN 500 MG PO TABS
1000.0000 mg | ORAL_TABLET | Freq: Once | ORAL | Status: AC
  Administered 2023-09-23: 1000 mg via ORAL
  Filled 2023-09-23: qty 2

## 2023-09-23 MED ORDER — FENTANYL CITRATE (PF) 100 MCG/2ML IJ SOLN
INTRAMUSCULAR | Status: AC
  Filled 2023-09-23: qty 2

## 2023-09-23 MED ORDER — PHENYLEPHRINE 80 MCG/ML (10ML) SYRINGE FOR IV PUSH (FOR BLOOD PRESSURE SUPPORT)
PREFILLED_SYRINGE | INTRAVENOUS | Status: DC | PRN
  Administered 2023-09-23: 160 ug via INTRAVENOUS
  Administered 2023-09-23: 80 ug via INTRAVENOUS

## 2023-09-23 MED ORDER — SUGAMMADEX SODIUM 200 MG/2ML IV SOLN
INTRAVENOUS | Status: DC | PRN
  Administered 2023-09-23: 200 mg via INTRAVENOUS

## 2023-09-23 MED ORDER — MIDAZOLAM HCL 2 MG/2ML IJ SOLN
INTRAMUSCULAR | Status: AC
  Filled 2023-09-23: qty 2

## 2023-09-23 MED ORDER — SODIUM CHLORIDE 0.9 % IV SOLN: INTRAVENOUS | Status: DC | PRN

## 2023-09-23 MED ORDER — EPHEDRINE SULFATE-NACL 50-0.9 MG/10ML-% IV SOSY
PREFILLED_SYRINGE | INTRAVENOUS | Status: DC | PRN
  Administered 2023-09-23: 5 mg via INTRAVENOUS

## 2023-09-23 MED ORDER — ASPIRIN 81 MG PO CHEW: 81.0000 mg | CHEWABLE_TABLET | Freq: Two times a day (BID) | ORAL | 0 refills | Status: AC

## 2023-09-23 MED ORDER — ACETAMINOPHEN 500 MG PO TABS: 1000.0000 mg | ORAL_TABLET | Freq: Three times a day (TID) | ORAL | 0 refills | Status: AC

## 2023-09-23 MED ORDER — SODIUM CHLORIDE 0.9 % IV SOLN
INTRAVENOUS | Status: DC | PRN
  Administered 2023-09-23: 20 mL

## 2023-09-23 MED ORDER — METOCLOPRAMIDE HCL 5 MG/ML IJ SOLN: 5.0000 mg | Freq: Three times a day (TID) | INTRAMUSCULAR | Status: DC | PRN

## 2023-09-23 MED ORDER — VALSARTAN-HYDROCHLOROTHIAZIDE 160-12.5 MG PO TABS: 1.0000 | ORAL_TABLET | Freq: Every day | ORAL | Status: DC

## 2023-09-23 MED ORDER — FENTANYL CITRATE PF 50 MCG/ML IJ SOSY
PREFILLED_SYRINGE | INTRAMUSCULAR | Status: AC
  Filled 2023-09-23: qty 2

## 2023-09-23 MED ORDER — LEVOTHYROXINE SODIUM 112 MCG PO TABS
112.0000 ug | ORAL_TABLET | Freq: Every day | ORAL | Status: DC
  Administered 2023-09-24: 112 ug via ORAL
  Filled 2023-09-23: qty 1

## 2023-09-23 MED ORDER — DOCUSATE SODIUM 100 MG PO CAPS: 100.0000 mg | ORAL_CAPSULE | Freq: Two times a day (BID) | ORAL | 0 refills | Status: DC

## 2023-09-23 MED ORDER — ONDANSETRON HCL 4 MG PO TABS: 4.0000 mg | ORAL_TABLET | Freq: Three times a day (TID) | ORAL | 0 refills | Status: AC | PRN

## 2023-09-23 MED ORDER — FLUTICASONE FUROATE-VILANTEROL 100-25 MCG/ACT IN AEPB
1.0000 | INHALATION_SPRAY | Freq: Every day | RESPIRATORY_TRACT | Status: DC
  Administered 2023-09-24: 1 via RESPIRATORY_TRACT
  Filled 2023-09-23: qty 28

## 2023-09-23 MED ORDER — 0.9 % SODIUM CHLORIDE (POUR BTL) OPTIME
TOPICAL | Status: DC | PRN
  Administered 2023-09-23: 1000 mL

## 2023-09-23 MED ORDER — CEFAZOLIN SODIUM-DEXTROSE 2-4 GM/100ML-% IV SOLN
2.0000 g | INTRAVENOUS | Status: AC
  Administered 2023-09-23: 2 g via INTRAVENOUS
  Filled 2023-09-23: qty 100

## 2023-09-23 MED ORDER — METOCLOPRAMIDE HCL 5 MG PO TABS: 5.0000 mg | ORAL_TABLET | Freq: Three times a day (TID) | ORAL | Status: DC | PRN

## 2023-09-23 MED ORDER — BISACODYL 10 MG RE SUPP: 10.0000 mg | Freq: Every day | RECTAL | Status: DC | PRN

## 2023-09-23 MED ORDER — MAGNESIUM CITRATE PO SOLN: 1.0000 | Freq: Once | ORAL | Status: DC | PRN

## 2023-09-23 MED ORDER — OXYCODONE HCL 5 MG PO TABS
ORAL_TABLET | ORAL | Status: AC
  Filled 2023-09-23: qty 1

## 2023-09-23 MED ORDER — ONDANSETRON HCL 4 MG/2ML IJ SOLN
INTRAMUSCULAR | Status: AC
  Filled 2023-09-23: qty 2

## 2023-09-23 MED ORDER — ACETAMINOPHEN 325 MG PO TABS: 325.0000 mg | ORAL_TABLET | Freq: Four times a day (QID) | ORAL | Status: DC | PRN

## 2023-09-23 MED ORDER — DIPHENHYDRAMINE HCL 12.5 MG/5ML PO ELIX
12.5000 mg | ORAL_SOLUTION | ORAL | Status: DC | PRN
  Administered 2023-09-23 – 2023-09-24 (×3): 25 mg via ORAL
  Filled 2023-09-23 (×3): qty 10

## 2023-09-23 MED ORDER — PHENOL 1.4 % MT LIQD: 1.0000 | OROMUCOSAL | Status: DC | PRN

## 2023-09-23 MED ORDER — MENTHOL 3 MG MT LOZG: 1.0000 | LOZENGE | OROMUCOSAL | Status: DC | PRN

## 2023-09-23 MED ORDER — LIDOCAINE HCL (PF) 2 % IJ SOLN
INTRAMUSCULAR | Status: AC
  Filled 2023-09-23: qty 5

## 2023-09-23 MED ORDER — METHOCARBAMOL 750 MG PO TABS: 750.0000 mg | ORAL_TABLET | Freq: Three times a day (TID) | ORAL | 0 refills | Status: DC | PRN

## 2023-09-23 MED ORDER — HYDROXYZINE PAMOATE 25 MG PO CAPS
25.0000 mg | ORAL_CAPSULE | Freq: Every evening | ORAL | Status: DC
  Filled 2023-09-23 (×2): qty 1

## 2023-09-23 MED ORDER — FENTANYL CITRATE (PF) 100 MCG/2ML IJ SOLN
INTRAMUSCULAR | Status: DC | PRN
  Administered 2023-09-23: 50 ug via INTRAVENOUS
  Administered 2023-09-23 (×6): 25 ug via INTRAVENOUS

## 2023-09-23 MED ORDER — TRANEXAMIC ACID-NACL 1000-0.7 MG/100ML-% IV SOLN
1000.0000 mg | INTRAVENOUS | Status: AC
  Administered 2023-09-23: 1000 mg via INTRAVENOUS
  Filled 2023-09-23: qty 100

## 2023-09-23 MED ORDER — IRBESARTAN 150 MG PO TABS
150.0000 mg | ORAL_TABLET | Freq: Every day | ORAL | Status: DC
  Administered 2023-09-24: 150 mg via ORAL
  Filled 2023-09-23: qty 1

## 2023-09-23 MED ORDER — PROPOFOL 500 MG/50ML IV EMUL
INTRAVENOUS | Status: DC | PRN
  Administered 2023-09-23: 70 ug/kg/min via INTRAVENOUS
  Administered 2023-09-23: 50 mg via INTRAVENOUS
  Administered 2023-09-23: 200 mg via INTRAVENOUS

## 2023-09-23 MED ORDER — FENTANYL CITRATE PF 50 MCG/ML IJ SOSY
25.0000 ug | PREFILLED_SYRINGE | INTRAMUSCULAR | Status: DC | PRN
  Administered 2023-09-23 (×3): 50 ug via INTRAVENOUS

## 2023-09-23 MED ORDER — TRAZODONE HCL 50 MG PO TABS
50.0000 mg | ORAL_TABLET | Freq: Every day | ORAL | Status: DC
  Filled 2023-09-23: qty 1

## 2023-09-23 MED ORDER — BUPIVACAINE LIPOSOME 1.3 % IJ SUSP: 10.0000 mL | Freq: Once | INTRAMUSCULAR | Status: DC

## 2023-09-23 MED ORDER — POLYETHYLENE GLYCOL 3350 17 G PO PACK: 17.0000 g | PACK | Freq: Every day | ORAL | Status: DC | PRN

## 2023-09-23 MED ORDER — PROPOFOL 1000 MG/100ML IV EMUL
INTRAVENOUS | Status: AC
Start: 2023-09-23 — End: ?
  Filled 2023-09-23: qty 100

## 2023-09-23 MED ORDER — METOPROLOL SUCCINATE ER 50 MG PO TB24
50.0000 mg | ORAL_TABLET | Freq: Every day | ORAL | Status: DC
  Administered 2023-09-24: 50 mg via ORAL
  Filled 2023-09-23: qty 1

## 2023-09-23 MED ORDER — HYDROCHLOROTHIAZIDE 12.5 MG PO TABS
12.5000 mg | ORAL_TABLET | Freq: Every day | ORAL | Status: DC
  Administered 2023-09-24: 12.5 mg via ORAL
  Filled 2023-09-23: qty 1

## 2023-09-23 MED ORDER — ACETAMINOPHEN 500 MG PO TABS: 1000.0000 mg | ORAL_TABLET | Freq: Once | ORAL | Status: DC

## 2023-09-23 MED ORDER — DEXAMETHASONE SODIUM PHOSPHATE 10 MG/ML IJ SOLN
10.0000 mg | Freq: Once | INTRAMUSCULAR | Status: AC
  Administered 2023-09-23: 10 mg via INTRAVENOUS
  Filled 2023-09-23: qty 1

## 2023-09-23 MED ORDER — OXYCODONE HCL 5 MG/5ML PO SOLN: 5.0000 mg | Freq: Once | ORAL | Status: AC | PRN

## 2023-09-23 MED ORDER — SODIUM CHLORIDE (PF) 0.9 % IJ SOLN
INTRAMUSCULAR | Status: AC
  Filled 2023-09-23: qty 30

## 2023-09-23 MED ORDER — LACTATED RINGERS IV BOLUS: 250.0000 mL | Freq: Once | INTRAVENOUS | Status: DC

## 2023-09-23 MED ORDER — ROCURONIUM BROMIDE 10 MG/ML (PF) SYRINGE
PREFILLED_SYRINGE | INTRAVENOUS | Status: DC | PRN
Start: 2023-09-23 — End: 2023-09-23
  Administered 2023-09-23: 40 mg via INTRAVENOUS
  Administered 2023-09-23: 10 mg via INTRAVENOUS

## 2023-09-23 MED ORDER — METHOCARBAMOL 500 MG PO TABS
500.0000 mg | ORAL_TABLET | Freq: Four times a day (QID) | ORAL | Status: DC | PRN
  Administered 2023-09-23 – 2023-09-24 (×2): 500 mg via ORAL
  Filled 2023-09-23 (×2): qty 1

## 2023-09-23 MED ORDER — DEXAMETHASONE SODIUM PHOSPHATE 10 MG/ML IJ SOLN: 8.0000 mg | Freq: Once | INTRAMUSCULAR | Status: DC

## 2023-09-23 MED ORDER — ROSUVASTATIN CALCIUM 10 MG PO TABS
10.0000 mg | ORAL_TABLET | Freq: Every day | ORAL | Status: DC
  Administered 2023-09-24: 10 mg via ORAL
  Filled 2023-09-23: qty 1

## 2023-09-23 MED ORDER — SUCCINYLCHOLINE CHLORIDE 200 MG/10ML IV SOSY
PREFILLED_SYRINGE | INTRAVENOUS | Status: DC | PRN
  Administered 2023-09-23: 140 mg via INTRAVENOUS

## 2023-09-23 MED ORDER — DROPERIDOL 2.5 MG/ML IJ SOLN: 0.6250 mg | Freq: Once | INTRAMUSCULAR | Status: DC | PRN

## 2023-09-23 MED ORDER — METHOCARBAMOL 1000 MG/10ML IJ SOLN: 500.0000 mg | Freq: Four times a day (QID) | INTRAMUSCULAR | Status: DC | PRN

## 2023-09-23 SURGICAL SUPPLY — 35 items
BAG COUNTER SPONGE SURGICOUNT (BAG) IMPLANT
BIT DRILL TRIDENT 4X25 SU (BIT) IMPLANT
BLADE SAG 18X100X1.27 (BLADE) ×1 IMPLANT
CHLORAPREP W/TINT 26 (MISCELLANEOUS) ×1 IMPLANT
CLSR STERI-STRIP ANTIMIC 1/2X4 (GAUZE/BANDAGES/DRESSINGS) ×1 IMPLANT
COVER PERINEAL POST (MISCELLANEOUS) ×1 IMPLANT
COVER SURGICAL LIGHT HANDLE (MISCELLANEOUS) ×1 IMPLANT
DRAPE IMP U-DRAPE 54X76 (DRAPES) ×1 IMPLANT
DRAPE STERI IOBAN 125X83 (DRAPES) ×1 IMPLANT
DRAPE U-SHAPE 47X51 STRL (DRAPES) ×2 IMPLANT
DRSG MEPILEX POST OP 4X8 (GAUZE/BANDAGES/DRESSINGS) ×1 IMPLANT
ELECT REM PT RETURN 15FT ADLT (MISCELLANEOUS) ×1 IMPLANT
GLOVE BIO SURGEON STRL SZ7.5 (GLOVE) ×1 IMPLANT
GLOVE BIOGEL PI IND STRL 7.5 (GLOVE) ×1 IMPLANT
GLOVE BIOGEL PI IND STRL 8 (GLOVE) ×1 IMPLANT
GLOVE SURG SYN 7.0 (GLOVE) ×1 IMPLANT
GLOVE SURG SYN 7.0 PF PI (GLOVE) ×1 IMPLANT
GOWN STRL REUS W/ TWL LRG LVL3 (GOWN DISPOSABLE) ×1 IMPLANT
GOWN STRL REUS W/ TWL XL LVL3 (GOWN DISPOSABLE) ×1 IMPLANT
HEAD FEM -4XOFST TPR 32X (Joint) IMPLANT
INSERT ANG TRIDENT C 32 0D (Insert) IMPLANT
KIT TURNOVER KIT A (KITS) IMPLANT
MANIFOLD NEPTUNE II (INSTRUMENTS) ×1 IMPLANT
NS IRRIG 1000ML POUR BTL (IV SOLUTION) ×1 IMPLANT
PACK ANTERIOR HIP CUSTOM (KITS) ×1 IMPLANT
PROTECTOR NERVE ULNAR (MISCELLANEOUS) ×1 IMPLANT
SCREW HEX LP 6.5X20 (Screw) IMPLANT
SHELL ACETAB TRI HIP C 46 (Shell) IMPLANT
STEM STD OFFSET SZ 1 30.5 (Stem) IMPLANT
SUT MNCRL AB 3-0 PS2 18 (SUTURE) ×1 IMPLANT
SUT VIC AB 0 CT1 36 (SUTURE) ×1 IMPLANT
SUT VIC AB 1 CT1 36 (SUTURE) ×1 IMPLANT
SUT VIC AB 2-0 CT1 TAPERPNT 27 (SUTURE) ×1 IMPLANT
TUBE SUCTION HIGH CAP CLEAR NV (SUCTIONS) ×1 IMPLANT
WATER STERILE IRR 1000ML POUR (IV SOLUTION) ×2 IMPLANT

## 2023-09-23 NOTE — Transfer of Care (Signed)
 Immediate Anesthesia Transfer of Care Note  Patient: Candice Watkins  Procedure(s) Performed: ARTHROPLASTY, HIP, TOTAL, ANTERIOR APPROACH (Left: Hip)  Patient Location: PACU  Anesthesia Type:General  Level of Consciousness: awake, alert , oriented, and patient cooperative  Airway & Oxygen Therapy: Patient Spontanous Breathing and Patient connected to face mask oxygen  Post-op Assessment: Report given to RN and Post -op Vital signs reviewed and stable  Post vital signs: Reviewed and stable  Last Vitals:  Vitals Value Taken Time  BP 127/81 09/23/23 0934  Temp 36.7 C 09/23/23 0934  Pulse 70 09/23/23 0939  Resp 12 09/23/23 0939  SpO2 99 % 09/23/23 0939  Vitals shown include unfiled device data.  Last Pain:  Vitals:   09/23/23 0535  TempSrc: Oral  PainSc:          Complications: No notable events documented.

## 2023-09-23 NOTE — Therapy (Signed)
 OUTPATIENT PHYSICAL THERAPY LOWER EXTREMITY EVALUATION   Patient Name: Candice Watkins MRN: [id number] DOB:09-Aug-1970, 53 y.o., female Today's Date: 09/26/2023  END OF SESSION:  PT End of Session - 09/26/23 1218     Visit Number 1    Number of Visits 12    Date for PT Re-Evaluation 11/26/23    Authorization Type [employer] M[employer]    PT Start Time 1215    PT Stop Time 1300    PT Time Calculation (min) 45 min    Activity Tolerance Patient tolerated treatment well    Behavior During Therapy WFL for tasks assessed/performed             Past Medical History:  Diagnosis Date   Anxiety    Arthritis    "knees, ankles" (10/11/2015)   Asthma    Depression    Diabetes mellitus without complication (HCC)    Heartburn    HLD (hyperlipidemia)    Hypertension    Hypothyroidism    Kidney stones    Migraine    "monthly" (10/11/2015)   PONV (postoperative nausea and vomiting) X 1   Pre-diabetes    Primary hyperparathyroidism (HCC)    Seasonal allergies    Sleep apnea    Stomach ulcer    Past Surgical History:  Procedure Laterality Date   BREAST BIOPSY Bilateral    neg- core   CESAREAN SECTION  2000   COLONOSCOPY WITH PROPOFOL  N/A 05/02/2022   Procedure: COLONOSCOPY WITH PROPOFOL ;  Surgeon: Selena Daily, MD;  Location: [facility] ENDOSCOPY;  Service: Gastroenterology;  Laterality: N/A;   COLONOSCOPY WITH PROPOFOL  N/A 07/22/2022   Procedure: COLONOSCOPY WITH PROPOFOL ;  Surgeon: Selena Daily, MD;  Location[facility] ENDOSCOPY;  Service: Gastroenterology;  Laterality: N/A;   ESOPHAGOGASTRODUODENOSCOPY (EGD) WITH PROPOFOL  N/A 05/02/2022   Procedure: ESOPHAGOGASTRODUODENOSCOPY (EGD) WITH PROPOFOL ;  Surgeon: Selena Daily, MD;  Location: [facility] ENDOSCOPY;  Service: Gastroenterology;  Laterality: N/A;   JOINT REPLACEMENT     KNEE ARTHROSCOPY Right    THYROID  SURGERY     "removed tumor"   TOTAL HIP ARTHROPLASTY Left 09/23/2023   Procedure: ARTHROPLASTY, HIP, TOTAL, ANTERIOR  APPROACH;  Surgeon: [name], MD;  Location: [facility] ORS;  Service: Orthopedics;  Laterality: Left;   TOTAL KNEE ARTHROPLASTY Right 10/11/2015   TOTAL KNEE ARTHROPLASTY Right 10/11/2015   Procedure: TOTAL KNEE ARTHROPLASTY;  Surgeon: Ferd Householder, MD;  Location: [facility] OR;  Service: Orthopedics;  Laterality: Right;   TUBAL LIGATION     VAGINAL HYSTERECTOMY     Patient Active Problem List   Diagnosis Date Noted   Status post left hip replacement 09/23/2023   Obesity, morbid (HCC) 06/18/2023   Prediabetes 12/11/2022   Mixed hyperlipidemia 12/11/2022   Essential hypertension, benign 12/11/2022   Vitamin D  deficiency, unspecified 12/11/2022   B12 deficiency due to diet 12/11/2022   History of colonic polyps 07/22/2022   Dyspepsia 05/02/2022   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   Screening for colon cancer 05/02/2022   Polyp of colon 05/02/2022   MDD (major depressive disorder), recurrent severe, without psychosis (HCC) 01/28/2017   S/P total knee replacement 10/11/2015   Osteoarthritis of knee 09/27/2013   Patellofemoral dysfunction 09/27/2013   Hypothyroidism (acquired) 09/14/2007   Allergic rhinitis 03/04/2006    PCP: Trenda Frisk, FNP   REFERRING PROVIDER: [name], MD  REFERRING DIAG: S/P L ant total hip replacement PT SHOULD START 09/26/23  THERAPY DIAG:  Pain in left hip - Plan: PT plan of care cert/re-cert  Muscle weakness (generalized) - Plan: PT plan of care cert/re-cert  Other abnormalities of gait and mobility - Plan: PT plan of care cert/re-cert  Rationale for Evaluation and Treatment: Rehabilitation  ONSET DATE: 09/23/23  SUBJECTIVE:   SUBJECTIVE STATEMENT: L THA anterior approach 09/23/23.  Reports mainly soreness vs pain.  Able to negotiate room to room as well as 2 steps w/o rail using RW for support.  PERTINENT HISTORY: Candice Watkins is a 53 y.o. 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 The patient has a history of pain and  functional disability in the left hip due to arthritis and has failed non-surgical conservative treatments for greater than 12 weeks to include NSAID's and/or analgesics, corticosteriod injections, supervised PT with diminished ADL's post treatment, use of assistive devices, weight reduction as appropriate, and activity modification.  Onset of symptoms was gradual, starting 1 year ago with rapidlly worsening course since that time. The patient noted no past surgery on the left hip.  Patient currently rates pain at 10 out of 10 with activity. Patient has night pain, worsening of pain with activity and weight bearing, and pain that interferes with activities of daily living.  Patient has evidence of subchondral cysts, subchondral sclerosis, periarticular osteophytes, and joint space narrowing by imaging studies.  There is no active infection.  PAIN:  Are you having pain? Yes: NPRS scale: 6/10 Pain location: L hip Pain description: sore Aggravating factors: activity Relieving factors: ice, meds, rest  PRECAUTIONS: Anterior hip  RED FLAGS: None   WEIGHT BEARING RESTRICTIONS: No  FALLS:  Has patient fallen in last 6 months? No  LIVING ENVIRONMENT: Lives with: lives with their family Lives in: House/apartment Stairs: 2  Has following equipment at home: Environmental consultant - 2 wheeled  OCCUPATION: not working  PLOF: Independent  PATIENT GOALS: To regain function in my hip  NEXT MD VISIT: 10/08/23  OBJECTIVE:  Note: Objective measures were completed at Evaluation unless otherwise noted.  DIAGNOSTIC FINDINGS: n/a  PATIENT SURVEYS:  LEFS 14/80  MUSCLE LENGTH: Not tested  POSTURE: No Significant postural limitations  PALPATION: Deferred post-op  LOWER EXTREMITY ROM:  A/PROM Right eval Left eval  Hip flexion  70/90d  Hip extension    Hip abduction    Hip adduction    Hip internal rotation    Hip external rotation    Knee flexion    Knee extension    Ankle dorsiflexion    Ankle  plantarflexion    Ankle inversion    Ankle eversion     (Blank rows = not tested)  LOWER EXTREMITY MMT:  MMT Right eval Left eval  Hip flexion  3  Hip extension    Hip abduction  3  Hip adduction    Hip internal rotation    Hip external rotation    Knee flexion    Knee extension    Ankle dorsiflexion    Ankle plantarflexion    Ankle inversion    Ankle eversion     (Blank rows = not tested)  LOWER EXTREMITY SPECIAL TESTS:  Deferred post-op  FUNCTIONAL TESTS:  30 seconds chair stand test 4 arms crossed  GAIT: Distance walked: 104ftx2 Assistive device utilized: Environmental consultant - 2 wheeled Level of assistance: Complete Independence Comments: slow cadence step through pattern  09/26/23 2 MWT 190 ft with RW  TREATMENT DATE:  [facility] Adult PT Treatment:                                                DATE: 09/26/23 Eval and HEP Self Care: Additional minutes spent for educating on updated Therapeutic Home Exercise Program as well as comparing current status to condition at start of symptoms. This included exercises focusing on stretching, strengthening, with focus on eccentric aspects. Long term goals include an improvement in range of motion, strength, endurance as well as avoiding reinjury. Patient's frequency would include in 1-2 times a day, 3-5 times a week for a duration of 6-12 weeks. Proper technique shown and discussed handout in great detail. All questions were discussed and addressed.     PATIENT EDUCATION:  Education details: Discussed eval findings, rehab rationale and POC and patient is in agreement  Person educated: Patient Education method: Explanation Education comprehension: verbalized understanding and needs further education  HOME EXERCISE PROGRAM: Access Code: [email or web address] URL: [email or web address] Date: 09/26/2023 Prepared by:  Gretta Leavens  Exercises - Supine Heel Slide with Strap  - 2-3 x daily - 5 x weekly - 1 sets - 10 reps - Supine Quad Set  - 2-3 x daily - 5 x weekly - 1 sets - 10 reps - 3s hold - Standing Heel Raise with Support  - 2-3 x daily - 5 x weekly - 1 sets - 10 reps - Bent Knee Fallouts  - 2-3 x daily - 5 x weekly - 1 sets - 10 reps  ASSESSMENT:  CLINICAL IMPRESSION: Patient is a 53 y.o. female who was seen today for physical therapy evaluation and treatment s/p THA. Patient presents with good gait pattern, minimal pain levels and compliance with HEP.  ROM and strength deficits identified.  2 MWT established for baseline.  30s chair stand test tolerated w/o UE support needed.  LEFS identifies functional deficits.  OBJECTIVE IMPAIRMENTS: Abnormal gait, decreased activity tolerance, decreased balance, decreased endurance, decreased knowledge of use of DME, difficulty walking, decreased ROM, decreased strength, improper body mechanics, obesity, and pain.   ACTIVITY LIMITATIONS: carrying, lifting, standing, squatting, stairs, transfers, bed mobility, and bathing  PERSONAL FACTORS: Fitness, Past/current experiences, and 1 comorbidity: DM are also affecting patient's functional outcome.   REHAB POTENTIAL: Good  CLINICAL DECISION MAKING: Stable/uncomplicated  EVALUATION COMPLEXITY: Low   GOALS: Goals reviewed with patient? No  SHORT TERM GOALS: Target date: 10/16/2023   Patient to demonstrate independence in HEP  Baseline: [id number] Goal status: INITIAL  2.  Increase distance on 2 MWT with LRAD Baseline: 183ft w/RW Goal status: INITIAL    LONG TERM GOALS: Target date: 11/06/2023  Patient will increase 30s chair stand reps from 4 to 7 without arms to demonstrate and improved functional ability with less pain/difficulty as well as reduce fall risk.  Baseline: 4 Goal status: INITIAL  2.  Patient will acknowledge 4/10 pain at least once during episode of care   Baseline: 6/10 Goal  status: INITIAL  3.  Patient will score at least 40/80 on LEFS to signify clinically meaningful improvement in functional abilities.   Baseline: 14/80 Goal status: INITIAL  4.  Patient to negotiate 16 steps with most appropriate pattern. Baseline: TBD Goal status: INITIAL  5.  110d AROM L hip flexion Baseline: 70d Goal status: INITIAL  6.  4/5 L hip strength  Baseline: 3/5 Goal  status: INITIAL   PLAN:  PT FREQUENCY: 2x/week  PT DURATION: 6 weeks  PLANNED INTERVENTIONS: 97164- PT Re-evaluation, 97110-Therapeutic exercises, 97530- Therapeutic activity, 97112- Neuromuscular re-education, 97535- Self Care, [id number]- Manual therapy, [phone]- Gait training, Patient/Family education, Balance training, and Stair training  PLAN FOR NEXT SESSION: HEP review and update, manual techniques as appropriate, aerobic tasks, ROM and flexibility activities, strengthening and PREs, TPDN, gait and balance training as needed     Eldon Greenland, PT 09/26/2023, 1:18 PM

## 2023-09-23 NOTE — Anesthesia Postprocedure Evaluation (Signed)
 Anesthesia Post Note  Patient: Candice Watkins  Procedure(s) Performed: ARTHROPLASTY, HIP, TOTAL, ANTERIOR APPROACH (Left: Hip)     Patient location during evaluation: PACU Anesthesia Type: General Level of consciousness: awake and alert Pain management: pain level controlled Vital Signs Assessment: post-procedure vital signs reviewed and stable Respiratory status: spontaneous breathing, nonlabored ventilation and respiratory function stable Cardiovascular status: blood pressure returned to baseline Postop Assessment: no apparent nausea or vomiting Anesthetic complications: no   No notable events documented.          Rayfield Cairo

## 2023-09-23 NOTE — Op Note (Signed)
 09/23/2023  9:04 AM  PATIENT:  Candice Watkins   MRN: [id number]  PRE-OPERATIVE DIAGNOSIS:  OA LEFT HIP  POST-OPERATIVE DIAGNOSIS:  OA LEFT HIP  PROCEDURE:  Procedure(s): ARTHROPLASTY, HIP, TOTAL, ANTERIOR APPROACH  PREOPERATIVE INDICATIONS:    Candice Watkins is an 53 y.o. female who has a diagnosis of <principal problem not specified> and elected for surgical management after failing conservative treatment.  The risks benefits and alternatives were discussed with the patient including but not limited to the risks of nonoperative treatment, versus surgical intervention including infection, bleeding, nerve injury, periprosthetic fracture, the need for revision surgery, dislocation, leg length discrepancy, blood clots, cardiopulmonary complications, morbidity, mortality, among others, and they were willing to proceed.     OPERATIVE REPORT     SURGEON:   [name], MD    ASSISTANT:  Marzella Solan, PA-C, she was present and scrubbed throughout the case, critical for completion in a timely fashion, and for retraction, instrumentation, and closure.     ANESTHESIA:  General    COMPLICATIONS:  None.     COMPONENTS:  [employer] insignia femur size 1 with a 34 mm -4 head ball and an acetabular shell size 46 with a  polyethylene liner    PROCEDURE IN DETAIL:   The patient was met in the holding area and  identified.  The appropriate hip was identified and marked at the operative site.  The patient was then transported to the OR  and  placed under anesthesia per that record.  At that point, the patient was  placed in the supine position and  secured to the operating room table and all bony prominences padded. He received pre-operative antibiotics    The operative lower extremity was prepped from the iliac crest to the distal leg.  Sterile draping was performed.  Time out was performed prior to incision.      Skin incision was made just 2 cm lateral to the ASIS  extending in line  with the tensor fascia lata. Electrocautery was used to control all bleeders. I dissected down sharply to the fascia of the tensor fascia lata was confirmed that the muscle fibers beneath were running posteriorly. I then incised the fascia over the superficial tensor fascia lata in line with the incision. The fascia was elevated off the anterior aspect of the muscle the muscle was retracted posteriorly and protected throughout the case. I then used electrocautery to incise the tensor fascia lata fascia control and all bleeders. Immediately visible was the fat over top of the anterior neck and capsule.  I removed the anterior fat from the capsule and elevated the rectus muscle off of the anterior capsule. I then removed a large time of capsule. The retractors were then placed over the anterior acetabulum as well as around the superior and inferior neck.  I then made a femoral neck cut. Then used the power corkscrew to remove the femoral head from the acetabulum and thoroughly irrigated the acetabulum. I sized the femoral head.    I then exposed the deep acetabulum, cleared out any tissue including the ligamentum teres.   After adequate visualization, I excised the labrum, and then sequentially reamed.  I then impacted the acetabular implant into place using fluoroscopy for guidance.  Appropriate version and inclination was confirmed clinically matching their bony anatomy, and with fluoroscopy.  I placed a 20 mm screw in the posterior/superio position with an excellent bite.    I then placed the polyethylene liner in place  I then adducted the leg and released the external rotators from the posterior femur allowing it to be easily delivered up lateral and anterior to the acetabulum for preparation of the femoral canal.    I then prepared the proximal femur using the cookie-cutter and then sequentially reamed and broached.  A trial broach, neck, and head was utilized, and I reduced the hip and used  floroscopy to assess the neck length and femoral implant.  I then impacted the femoral prosthesis into place into the appropriate version. The hip was then reduced and fluoroscopy confirmed appropriate position. Leg lengths were restored.  I then irrigated the hip copiously again with, and repaired the fascia with Vicryl, followed by monocryl for the subcutaneous tissue, Monocryl for the skin, Steri-Strips and sterile gauze. The patient was then awakened and returned to PACU in stable and satisfactory condition. There were no complications.  POST OPERATIVE PLAN: WBAT, DVT px: SCD's/TED, ambulation and chemical dvt px  Randal Bury, MD Orthopedic Surgeon [phone]

## 2023-09-23 NOTE — Anesthesia Procedure Notes (Signed)
 Procedure Name: Intubation Date/Time: 09/23/2023 7:48 AM  Performed by: Mervyn Ace, CRNAPre-anesthesia Checklist: Patient identified, Emergency Drugs available, Suction available, Patient being monitored and Timeout performed Patient Re-evaluated:Patient Re-evaluated prior to induction Oxygen Delivery Method: Circle system utilized Preoxygenation: Pre-oxygenation with 100% oxygen Induction Type: IV induction Ventilation: Mask ventilation without difficulty Laryngoscope Size: Mac and 3 Grade View: Grade I Tube type: Oral Tube size: 7.0 mm Number of attempts: 1 Airway Equipment and Method: Stylet Placement Confirmation: ETT inserted through vocal cords under direct vision, positive ETCO2, CO2 detector and breath sounds checked- equal and bilateral Secured at: 22 cm Tube secured with: Tape (pink Hy-tape utilized) Dental Injury: Teeth and Oropharynx as per pre-operative assessment

## 2023-09-23 NOTE — Interval H&P Note (Signed)
 History and Physical Interval Note:  09/23/2023 6:43 AM  Candice Watkins  has presented today for surgery, with the diagnosis of OA LEFT HIP.  The various methods of treatment have been discussed with the patient and family. After consideration of risks, benefits and other options for treatment, the patient has consented to  Procedure(s): ARTHROPLASTY, HIP, TOTAL, ANTERIOR APPROACH (Left) as a surgical intervention.  The patient's history has been reviewed, patient examined, no change in status, stable for surgery.  I have reviewed the patient's chart and labs.  Questions were answered to the patient's satisfaction.     [name]

## 2023-09-23 NOTE — Evaluation (Signed)
 Physical Therapy Evaluation Patient Details Name: Candice Watkins MRN: [id number] DOB: October 24, 1970 Today's Date: 09/23/2023  History of Present Illness  Pt is a 53 year olf demale presents s/p L THA 09/23/23  Clinical Impression  Pt is s/p THA resulting in the deficits listed below (see PT Problem List). Pt is able to complete bed mobility with HOB elevated t aup A, uses gait belt to assist with LLE mobility, cues provided. Pt able to stand at SBA, uses walker to stabilize upon standing. Initiates steps, planning to attempt 1+1 steps along the path to her apt (approx 73ft), reports that she feels "very tired" and appears to have inc lethargy. Pt sits in recliner, VSS. Pt requests to use the restroom and was able to complete process including hand washing.Returns to bed. Pt will benefit from acute skilled PT to increase their independence and safety with mobility to facilitate discharge.          If plan is discharge home, recommend the following: A little help with walking and/or transfers;A little help with bathing/dressing/bathroom;Assistance with cooking/housework;Assist for transportation;Help with stairs or ramp for entrance   Can travel by private vehicle        Equipment Recommendations Rolling walker (2 wheels)  Recommendations for Other Services       Functional Status Assessment Patient has had a recent decline in their functional status and demonstrates the ability to make significant improvements in function in a reasonable and predictable amount of time.     Precautions / Restrictions Precautions Precautions: Fall Restrictions Weight Bearing Restrictions Per Provider Order: Yes LLE Weight Bearing Per Provider Order: Weight bearing as tolerated      Mobility  Bed Mobility Overal bed mobility: Needs Assistance Bed Mobility: Supine to Sit, Sit to Supine     Supine to sit: Supervision Sit to supine: Supervision   General bed mobility comments: uses gait belt to  assist with LLE    Transfers Overall transfer level: Needs assistance Equipment used: Rolling walker (2 wheels) Transfers: Sit to/from Stand Sit to Stand: Supervision                Ambulation/Gait Ambulation/Gait assistance: Contact guard assist Gait Distance (Feet): 15 Feet Assistive device: Rolling walker (2 wheels) Gait Pattern/deviations: Step-to pattern, Decreased stride length, Antalgic, Wide base of support Gait velocity: dec     General Gait Details: pt uses step to pattern with walker, initial attempt to amb in hallway dc when pt appeared to become lethargic and reported feeling tired, VSS. Pt states that she would like to use the restroom and was able to amb 2x15 ft with CGA  Stairs            Wheelchair Mobility     Tilt Bed    Modified Rankin (Stroke Patients Only)       Balance Overall balance assessment: Needs assistance Sitting-balance support: Feet supported, No upper extremity supported Sitting balance-Leahy Scale: Good     Standing balance support: No upper extremity supported Standing balance-Leahy Scale: Fair                               Pertinent Vitals/Pain Pain Assessment Pain Assessment: 0-10 Pain Score: 4  Pain Location: L hip Pain Descriptors / Indicators: Aching, Cramping, Operative site guarding Pain Intervention(s): Limited activity within patient's tolerance, Monitored during session, Premedicated before session    Home Living Family/patient expects to be discharged to:: Private residence Living  Arrangements: Alone Available Help at Discharge: Available 24 hours/day (x2 weeks at dc) Type of Home: Apartment Home Access: Level entry       Home Layout: One level Home Equipment: Cane - single point Additional Comments: Pt mom and dtr will be coming to help her at dc x2 weeks    Prior Function Prior Level of Function : Independent/Modified Independent                     Extremity/Trunk  Assessment   Upper Extremity Assessment Upper Extremity Assessment: Overall WFL for tasks assessed    Lower Extremity Assessment Lower Extremity Assessment: Overall WFL for tasks assessed    Cervical / Trunk Assessment Cervical / Trunk Assessment: Kyphotic  Communication   Communication Communication: No apparent difficulties    Cognition Arousal: Lethargic Behavior During Therapy: WFL for tasks assessed/performed   PT - Cognitive impairments: No apparent impairments                         Following commands: Intact       Cueing Cueing Techniques: Verbal cues, Tactile cues     General Comments      Exercises     Assessment/Plan    PT Assessment Patient needs continued PT services  PT Problem List Decreased strength;Decreased activity tolerance;Decreased mobility;Decreased range of motion;Decreased balance       PT Treatment Interventions DME instruction;Functional mobility training;Balance training;Patient/family education;Gait training;Therapeutic activities;Stair training;Therapeutic exercise    PT Goals (Current goals can be found in the Care Plan section)  Acute Rehab PT Goals Patient Stated Goal: improve L leg movements and strength PT Goal Formulation: With patient Time For Goal Achievement: 10/07/23 Potential to Achieve Goals: Good    Frequency 7X/week     Co-evaluation               AM-PAC PT "6 Clicks" Mobility  Outcome Measure Help needed turning from your back to your side while in a flat bed without using bedrails?: A Little Help needed moving from lying on your back to sitting on the side of a flat bed without using bedrails?: A Little Help needed moving to and from a bed to a chair (including a wheelchair)?: A Little Help needed standing up from a chair using your arms (e.g., wheelchair or bedside chair)?: A Little Help needed to walk in hospital room?: A Little Help needed climbing 3-5 steps with a railing? : A Lot 6  Click Score: 17    End of Session Equipment Utilized During Treatment: Gait belt Activity Tolerance: Patient tolerated treatment well;Patient limited by lethargy Patient left: in bed;with call bell/phone within reach;with bed alarm set;with family/visitor present Nurse Communication: Mobility status PT Visit Diagnosis: Difficulty in walking, not elsewhere classified (R26.2)    Time: 1610-9604 PT Time Calculation (min) (ACUTE ONLY): 39 min   Charges:   PT Evaluation $PT Eval Low Complexity: 1 Low PT Treatments $Gait Training: 8-22 mins $Therapeutic Activity: 8-22 mins PT General Charges $$ ACUTE PT VISIT: 1 Visit         Darien Eden, PT Acute Rehabilitation Services Office: [phone] 09/23/2023   Serafin Dames 09/23/2023, 4:22 PM

## 2023-09-24 ENCOUNTER — Encounter (HOSPITAL_COMMUNITY): Payer: Self-pay | Admitting: Orthopedic Surgery

## 2023-09-24 DIAGNOSIS — M1612 Unilateral primary osteoarthritis, left hip: Secondary | ICD-10-CM | POA: Diagnosis not present

## 2023-09-24 MED ORDER — HYDROXYZINE HCL 25 MG PO TABS: 25.0000 mg | ORAL_TABLET | Freq: Every evening | ORAL | Status: DC

## 2023-09-24 NOTE — Discharge Summary (Signed)
 Physician Discharge Summary  Patient ID: Candice Watkins MRN: [id number] DOB/AGE: 20-Sep-1970 53 y.o.  Admit date: 09/23/2023 Discharge date: 09/24/2023  Admission Diagnoses:  Status post left hip replacement  Discharge Diagnoses:  Principal Problem:   Status post left hip replacement   Past Medical History:  Diagnosis Date   Anxiety    Arthritis    "knees, ankles" (10/11/2015)   Asthma    Depression    Diabetes mellitus without complication (HCC)    Heartburn    HLD (hyperlipidemia)    Hypertension    Hypothyroidism    Kidney stones    Migraine    "monthly" (10/11/2015)   PONV (postoperative nausea and vomiting) X 1   Pre-diabetes    Primary hyperparathyroidism (HCC)    Seasonal allergies    Sleep apnea    Stomach ulcer     Surgeries: Procedure(s): ARTHROPLASTY, HIP, TOTAL, ANTERIOR APPROACH on 09/23/2023   Consultants (if any):   Discharged Condition: Improved  Hospital Course: [place] Candice Watkins is an 53 y.o. female who was admitted 09/23/2023 with a diagnosis of Status post left hip replacement and went to the operating room on 09/23/2023 and underwent the above named procedures.  Patient with notable pain post-operatively.  Admitted overnight for pain management which was managed appropriately and improved.  She was given perioperative antibiotics:  Anti-infectives (From admission, onward)    Start     Dose/Rate Route Frequency Ordered Stop   09/23/23 0600  ceFAZolin  (ANCEF ) IVPB 2g/100 mL premix        2 g 200 mL/hr over 30 Minutes Intravenous On call to O.R. 09/23/23 0522 09/23/23 0803     .  She was given sequential compression devices, early ambulation, and Aspirin for DVT prophylaxis.  She benefited maximally from the hospital stay and there were no complications.    Recent vital signs:  Vitals:   09/24/23 0110 09/24/23 0600  BP: (!) 142/81 134/72  Pulse: 94 81  Resp: 17 15  Temp: 99.2 F (37.3 C) 98.4 F (36.9 C)  SpO2: 99% 95%    Recent  laboratory studies:  Lab Results  Component Value Date   HGB 11.9 (L) 09/10/2023   HGB 12.0 06/25/2023   HGB 12.0 03/25/2023   Lab Results  Component Value Date   WBC 5.3 09/10/2023   PLT 298 09/10/2023   Lab Results  Component Value Date   INR 1.05 11/23/2015   Lab Results  Component Value Date   NA 138 09/10/2023   K 4.0 09/10/2023   CL 104 09/10/2023   CO2 26 09/10/2023   BUN 10 09/10/2023   CREATININE 1.01 (H) 09/10/2023   GLUCOSE 82 09/10/2023    Discharge Medications:   Allergies as of 09/24/2023       Reactions   Ibuprofen  Other (See Comments)   History of Stomach Ulcers   Metronidazole Swelling, Other (See Comments)   EDEMA   Phenyltoloxamine-acetaminophen  Hives   Other Nausea And Vomiting   Cogesic   Septra [sulfamethoxazole-trimethoprim] Nausea And Vomiting        Medication List     STOP taking these medications    aspirin-acetaminophen -caffeine 250-250-65 MG tablet Commonly known as: EXCEDRIN MIGRAINE   ibuprofen  800 MG tablet Commonly known as: ADVIL        TAKE these medications    acetaminophen  500 MG tablet Commonly known as: TYLENOL  Take 2 tablets (1,000 mg total) by mouth every 8 (eight) hours for 14 days.   aspirin 81 MG chewable tablet  Commonly known as: Aspirin Childrens Chew 1 tablet (81 mg total) by mouth 2 (two) times daily. For DVT prophylaxis after surgery   Azelastine HCl 137 MCG/SPRAY Soln SPRAY 1 SPRAY INTO EACH NOSTRIL TWICE A DAY What changed: See the new instructions.   budesonide-formoterol 80-4.5 MCG/ACT inhaler Commonly known as: SYMBICORT TAKE 2 PUFFS BY MOUTH TWICE A DAY   cetirizine 10 MG tablet Commonly known as: ZYRTEC Take 10 mg by mouth daily as needed for allergies.   docusate sodium  100 MG capsule Commonly known as: Colace Take 1 capsule (100 mg total) by mouth 2 (two) times daily. To prevent constipation while taking pain medication.   hydrOXYzine  25 MG capsule Commonly known as:  VISTARIL  ONCE A DAY AS NEEDED FOR ANXIETY What changed: See the new instructions.   lactulose  10 GM/15ML solution Commonly known as: CHRONULAC  TAKE 30 MLS (20 G TOTAL) BY MOUTH DAILY AS NEEDED FOR MODERATE CONSTIPATION OR SEVERE CONSTIPATION.   levothyroxine  112 MCG tablet Commonly known as: SYNTHROID  TAKE 1 TABLET BY MOUTH EVERY MORNING AT LEAST 30 MINUTES BEFORE FOOD OR OTHER MEDS.   meloxicam 15 MG tablet Commonly known as: MOBIC Take 1 tablet (15 mg total) by mouth daily. For 2 weeks for pain and inflammation. Then take as needed   methocarbamol  750 MG tablet Commonly known as: ROBAXIN  Take 1 tablet (750 mg total) by mouth every 8 (eight) hours as needed for muscle spasms.   metoprolol  succinate 50 MG 24 hr tablet Commonly known as: TOPROL -XL TAKE 1 TABLET BY MOUTH EVERY DAY What changed: when to take this   Mounjaro  7.5 MG/0.5ML Pen Generic drug: tirzepatide  INJECT 7.5 MG SUBCUTANEOUSLY WEEKLY What changed: See the new instructions.   ondansetron  4 MG disintegrating tablet Commonly known as: ZOFRAN -ODT TAKE 1 TABLET BY MOUTH EVERY 8 HOURS AS NEEDED FOR NAUSEA   ondansetron  4 MG tablet Commonly known as: Zofran  Take 1 tablet (4 mg total) by mouth every 8 (eight) hours as needed for up to 7 days for nausea or vomiting.   oxyCODONE  5 MG immediate release tablet Commonly known as: Oxy IR/ROXICODONE  Take 1-2 pills every 6 hrs as needed for severe pain, no more than 6 per day   rosuvastatin 10 MG tablet Commonly known as: CRESTOR TAKE 1 TABLET BY MOUTH EVERY DAY What changed: when to take this   traZODone  50 MG tablet Commonly known as: DESYREL  TAKE 1 TABLET BY MOUTH EVERYDAY AT BEDTIME   Trintellix  20 MG Tabs tablet Generic drug: vortioxetine  HBr Take 20 mg by mouth in the morning.   valsartan -hydrochlorothiazide  160-12.5 MG tablet Commonly known as: Diovan  HCT Take 1 tablet by mouth daily.        Diagnostic Studies: DG HIP UNILAT WITH PELVIS 1V  LEFT Result Date: 09/23/2023 CLINICAL DATA:  Elective surgery. EXAM: DG HIP (WITH OR WITHOUT PELVIS) 1V*L* COMPARISON:  None Available. FINDINGS: Two fluoroscopic spot views of the pelvis and left hip obtained in the operating room. Images during hip arthroplasty. Fluoroscopy time 16 seconds. Dose 2.7090 mGy. IMPRESSION: Intraoperative fluoroscopy during left hip arthroplasty. Electronically Signed   By: Chadwick Colonel M.D.   On: 09/23/2023 10:27   DG C-Arm 1-60 Min-No Report Result Date: 09/23/2023 Fluoroscopy was utilized by the requesting physician.  No radiographic interpretation.   DG C-Arm 1-60 Min-No Report Result Date: 09/23/2023 Fluoroscopy was utilized by the requesting physician.  No radiographic interpretation.    Disposition: Discharge disposition: 01-Home or Self Care       Discharge Instructions  Call MD / Call 911   Complete by: As directed    If you experience chest pain or shortness of breath, CALL 911 and be transported to the hospital emergency room.  If you develope a fever above 101 F, pus (white drainage) or increased drainage or redness at the wound, or calf pain, call your surgeon's office.   Call MD for:  redness, tenderness, or signs of infection (pain, swelling, redness, odor or green/yellow discharge around incision site)   Complete by: As directed    Call MD for:  severe uncontrolled pain   Complete by: As directed    Call MD for:  temperature >100.4   Complete by: As directed    Constipation Prevention   Complete by: As directed    Drink plenty of fluids.  Prune juice may be helpful.  You may use a stool softener, such as Colace (over the counter) 100 mg twice a day.  Use MiraLax (over the counter) for constipation as needed.   Diet - low sodium heart healthy   Complete by: As directed    Diet - low sodium heart healthy   Complete by: As directed    Driving restrictions   Complete by: As directed    No driving for 2-4 weeks   Follow the hip  precautions as taught in Physical Therapy   Complete by: As directed    Increase activity slowly as tolerated   Complete by: As directed    Post-operative opioid taper instructions:   Complete by: As directed    POST-OPERATIVE OPIOID TAPER INSTRUCTIONS: It is important to wean off of your opioid medication as soon as possible. If you do not need pain medication after your surgery it is ok to stop day one. Opioids include: Codeine, Hydrocodone(Norco, Vicodin), Oxycodone (Percocet, oxycontin ) and hydromorphone  amongst others.  Long term and even short term use of opiods can cause: Increased pain response Dependence Constipation Depression Respiratory depression And more.  Withdrawal symptoms can include Flu like symptoms Nausea, vomiting And more Techniques to manage these symptoms Hydrate well Eat regular healthy meals Stay active Use relaxation techniques(deep breathing, meditating, yoga) Do Not substitute Alcohol to help with tapering If you have been on opioids for less than two weeks and do not have pain than it is ok to stop all together.  Plan to wean off of opioids This plan should start within one week post op of your joint replacement. Maintain the same interval or time between taking each dose and first decrease the dose.  Cut the total daily intake of opioids by one tablet each day Next start to increase the time between doses. The last dose that should be eliminated is the evening dose.      TED hose   Complete by: As directed    Use stockings (TED hose) for 2 weeks on both leg(s).  Then for 2 more weeks on the surgical leg.  You may remove them at night for sleeping.        Follow-up Information     [name], MD. Go on 10/08/2023.   Specialty: Orthopedic Surgery Why: your appointment is scheduled for 4:15. Contact information: [address] [place] [address] [phone]         [facility].    Why: YOur outpatient physical therapy has been scheduled. they will call you with a time Contact information: [phone]  Discharge Instructions      You may bear weight as tolerated. Keep your dressing on and dry until follow up. Take medicine to prevent blood clots as directed. Take pain medicine as needed with the goal of transitioning to over the counter medicines. It is okay to take 2 tablets of your narcotic medicine instead of just 1 tablet in the first 1-2 days after surgery when pain is at the worst. Do not continue to do this for multiple days at a time beyond that though.    INSTRUCTIONS AFTER JOINT REPLACEMENT   Remove items at home which could result in a fall. This includes throw rugs or furniture in walking pathways ICE to the affected joint every three hours while awake for 30 minutes at a time, for at least the first 3-5 days, and then as needed for pain and swelling.  Continue to use ice for pain and swelling. You may notice swelling that will progress down to the foot and ankle.  This is normal after surgery.  Elevate your leg when you are not up walking on it.   Continue to use the breathing machine you got in the hospital (incentive spirometer) which will help keep your temperature down.  It is common for your temperature to cycle up and down following surgery, especially at night when you are not up moving around and exerting yourself.  The breathing machine keeps your lungs expanded and your temperature down.   DIET:  As you were doing prior to hospitalization, we recommend a well-balanced diet.  DRESSING / WOUND CARE / SHOWERING  You may shower 3 days after surgery, but keep the wounds dry during showering.  You may use an occlusive plastic wrap (Press'n Seal for example) with blue painter's tape at edges, NO SOAKING/SUBMERGING IN THE BATHTUB.  If the bandage gets wet, call the office.   ACTIVITY  Increase activity slowly as  tolerated, but follow the weight bearing instructions below.   No driving for 6 weeks or until further direction given by your physician.  You cannot drive while taking narcotics.  No lifting or carrying greater than 10 lbs. until further directed by your surgeon. Avoid periods of inactivity such as sitting longer than an hour when not asleep. This helps prevent blood clots.  You may return to work once you are authorized by your doctor.    WEIGHT BEARING   Weight bearing as tolerated with assist device (walker, cane, etc) as directed, use it as long as suggested by your surgeon or therapist, typically at least 4-6 weeks.   EXERCISES  Results after joint replacement surgery are often greatly improved when you follow the exercise, range of motion and muscle strengthening exercises prescribed by your doctor. Safety measures are also important to protect the joint from further injury. Any time any of these exercises cause you to have increased pain or swelling, decrease what you are doing until you are comfortable again and then slowly increase them. If you have problems or questions, call your caregiver or physical therapist for advice.   Rehabilitation is important following a joint replacement. After just a few days of immobilization, the muscles of the leg can become weakened and shrink (atrophy).  These exercises are designed to build up the tone and strength of the thigh and leg muscles and to improve motion. Often times heat used for twenty to thirty minutes before working out will loosen up your tissues and help with improving the range of motion but do  not use heat for the first two weeks following surgery (sometimes heat can increase post-operative swelling).   These exercises can be done on a training (exercise) mat, on the floor, on a table or on a bed. Use whatever works the best and is most comfortable for you.    Use music or television while you are exercising so that the exercises are  a pleasant break in your day. This will make your life better with the exercises acting as a break in your routine that you can look forward to.   Perform all exercises about fifteen times, three times per day or as directed.  You should exercise both the operative leg and the other leg as well.  Exercises include:   Quad Sets - Tighten up the muscle on the front of the thigh (Quad) and hold for 5-10 seconds.   Straight Leg Raises - With your knee straight (if you were given a brace, keep it on), lift the leg to 60 degrees, hold for 3 seconds, and slowly lower the leg.  Perform this exercise against resistance later as your leg gets stronger.  Leg Slides: Lying on your back, slowly slide your foot toward your buttocks, bending your knee up off the floor (only go as far as is comfortable). Then slowly slide your foot back down until your leg is flat on the floor again.  Angel Wings: Lying on your back spread your legs to the side as far apart as you can without causing discomfort.  Hamstring Strength:  Lying on your back, push your heel against the floor with your leg straight by tightening up the muscles of your buttocks.  Repeat, but this time bend your knee to a comfortable angle, and push your heel against the floor.  You may put a pillow under the heel to make it more comfortable if necessary.   A rehabilitation program following joint replacement surgery can speed recovery and prevent re-injury in the future due to weakened muscles. Contact your doctor or a physical therapist for more information on knee rehabilitation.    CONSTIPATION  Constipation is defined medically as fewer than three stools per week and severe constipation as less than one stool per week.  Even if you have a regular bowel pattern at home, your normal regimen is likely to be disrupted due to multiple reasons following surgery.  Combination of anesthesia, postoperative narcotics, change in appetite and fluid intake all can  affect your bowels.   YOU MUST use at least one of the following options; they are listed in order of increasing strength to get the job done.  They are all available over the counter, and you may need to use some, POSSIBLY even all of these options:    Drink plenty of fluids (prune juice may be helpful) and high fiber foods Colace 100 mg by mouth twice a day  Senokot for constipation as directed and as needed Dulcolax (bisacodyl ), take with full glass of water  Miralax (polyethylene glycol) once or twice a day as needed.  If you have tried all these things and are unable to have a bowel movement in the first 3-4 days after surgery call either your surgeon or your primary doctor.    If you experience loose stools or diarrhea, hold the medications until you stool forms back up.  If your symptoms do not get better within 1 week or if they get worse, check with your doctor.  If you experience "the worst abdominal pain  ever" or develop nausea or vomiting, please contact the office immediately for further recommendations for treatment.   ITCHING:  If you experience itching with your medications, try taking only a single pain pill, or even half a pain pill at a time.  You can also use Benadryl  over the counter for itching or also to help with sleep.   TED HOSE STOCKINGS:  Use stockings on both legs until for at least 2 weeks or as directed by physician office. They may be removed at night for sleeping.  MEDICATIONS:  See your medication summary on the "After Visit Summary" that nursing will review with you.  You may have some home medications which will be placed on hold until you complete the course of blood thinner medication.  It is important for you to complete the blood thinner medication as prescribed.  Take medicines as prescribed.   You have several different medicines that work in different ways. - Tylenol  is for mild to moderate pain. Try to take this medicine before turning to your narcotic  medicines.  - Meloxicam is to reduce pain / inflammation - Robaxin  is for muscle spasms. This medicine can make you drowsy. - Oxycodone  is a narcotic pain medicine.  Take this for severe pain. This medicine can be dehydrating / constipating. - Zofran  is for nausea and vomiting. - Colace Miralax Senokot is for constipation prevention while taking pain medicine.  - Aspirin is to prevent blood clots after surgery. YOU MUST TAKE THIS MEDICINE!!  PRECAUTIONS:  If you experience chest pain or shortness of breath - call 911 immediately for transfer to the hospital emergency department.   If you develop a fever greater that 101 F, purulent drainage from wound, increased redness or drainage from wound, foul odor from the wound/dressing, or calf pain - CONTACT YOUR SURGEON.                                                   FOLLOW-UP APPOINTMENTS:  If you do not already have a post-op appointment, please call the office [phone] for an appointment to be seen by Dr. Abigail Abler in 2 weeks.   OTHER INSTRUCTIONS:   MAKE SURE YOU:  Understand these instructions.  Get help right away if you are not doing well or get worse.    Thank you for letting us  be a part of your medical care team.  It is a privilege we respect greatly.  We hope these instructions will help you stay on track for a fast and full recovery!     Signed: Albertus Alt 09/24/2023, 6:40 AM

## 2023-09-24 NOTE — Progress Notes (Signed)
 Physical Therapy Treatment Patient Details Name: Candice Watkins MRN: [id number] DOB: 10-03-1970 Today's Date: 09/24/2023   History of Present Illness Pt is a 53 year olf demale presents s/p L THA 09/23/23    PT Comments  POD # 1 am session PT AxO x 3 pleasant and eager to D/C to home.  Pt was OOB in recliner.  Assisted with amb in hallway and practiced stairs.  General Gait Details: tolerated a functional distance of 55 feet with < 25% VC's on proper walker use and safety with turns. General stair comments: Pt stated she has 2 sidewalk steps to get from parking lot to her apartment building, then level entry.  Practiced 2 steps with walker (no rails) forward. Then returned to room to perform some TE's following HEP handout.  Instructed on proper tech, freq as well as use of ICE.   Addressed all mobility questions, discussed appropriate activity, educated on use of ICE.  Pt ready for D/C to home.    If plan is discharge home, recommend the following: A little help with walking and/or transfers;A little help with bathing/dressing/bathroom;Assistance with cooking/housework;Assist for transportation;Help with stairs or ramp for entrance   Can travel by private vehicle        Equipment Recommendations  Rolling walker (2 wheels)    Recommendations for Other Services       Precautions / Restrictions Precautions Precautions: Fall Restrictions Weight Bearing Restrictions Per Provider Order: No LLE Weight Bearing Per Provider Order: Weight bearing as tolerated     Mobility  Bed Mobility               General bed mobility comments: OOB in recliner    Transfers   Equipment used: Rolling walker (2 wheels) Transfers: Sit to/from Stand Sit to Stand: Supervision           General transfer comment: <25% VC's on proper hand placement as well as safety with turns.    Ambulation/Gait Ambulation/Gait assistance: Supervision Gait Distance (Feet): 55 Feet Assistive device:  Rolling walker (2 wheels) Gait Pattern/deviations: Step-to pattern, Decreased stride length, Antalgic, Wide base of support Gait velocity: dec     General Gait Details: tolerated a functional distance of 55 feet with < 25% VC's on proper walker use and safety with turns.   Stairs Stairs: Yes Stairs assistance: Contact guard assist, Min assist Stair Management: No rails, Step to pattern, Forwards, With walker Number of Stairs: 2 General stair comments: Pt stated she has 2 sidewalk steps to get from parking lot to her apartment building, then level entry.  Practiced 2 steps with walker (no rails) forward.   Wheelchair Mobility     Tilt Bed    Modified Rankin (Stroke Patients Only)       Balance                                            Communication Communication Communication: No apparent difficulties  Cognition Arousal: Alert Behavior During Therapy: WFL for tasks assessed/performed   PT - Cognitive impairments: No apparent impairments                       PT - Cognition Comments: AxO x 3 pleasant.  "Reall tired" reports poor sleep last night. Following commands: Intact      Cueing Cueing Techniques: Verbal cues  Exercises  Total Hip  Replacement TE's following HEP Handout 10 reps ankle pumps 05 reps knee presses 05 reps heel slides 05 reps SAQ's 05 reps ABD Instructed how to use a belt loop to assist  Followed by ICE     General Comments        Pertinent Vitals/Pain Pain Assessment Pain Assessment: Faces Faces Pain Scale: Hurts a little bit Pain Location: L hip Pain Descriptors / Indicators: Aching, Cramping, Operative site guarding Pain Intervention(s): Monitored during session, Premedicated before session, Repositioned, Ice applied    Home Living                          Prior Function            PT Goals (current goals can now be found in the care plan section) Progress towards PT goals: Progressing  toward goals    Frequency    7X/week      PT Plan      Co-evaluation              AM-PAC PT "6 Clicks" Mobility   Outcome Measure  Help needed turning from your back to your side while in a flat bed without using bedrails?: A Little Help needed moving from lying on your back to sitting on the side of a flat bed without using bedrails?: A Little Help needed moving to and from a bed to a chair (including a wheelchair)?: A Little Help needed standing up from a chair using your arms (e.g., wheelchair or bedside chair)?: A Little Help needed to walk in hospital room?: A Little Help needed climbing 3-5 steps with a railing? : A Lot 6 Click Score: 17    End of Session Equipment Utilized During Treatment: Gait belt Activity Tolerance: Patient tolerated treatment well Patient left: in chair Nurse Communication: Mobility status PT Visit Diagnosis: Difficulty in walking, not elsewhere classified (R26.2)     Time: 1610-9604 PT Time Calculation (min) (ACUTE ONLY): 28 min  Charges:    $Gait Training: 8-22 mins $Therapeutic Exercise: 8-22 mins PT General Charges $$ ACUTE PT VISIT: 1 Visit                     Bess Broody  PTA Acute  Rehabilitation Services Office M-F          [phone]

## 2023-09-24 NOTE — Progress Notes (Signed)
     1 Day Post-Op Procedure(s) (LRB): ARTHROPLASTY, HIP, TOTAL, ANTERIOR APPROACH (Left) Subjective:  Patient reports pain as mild.  Slept okay.  No overnight events.  Walked 15 ft w/ RW with PT yesterday.  Denies chest pain, ShOB, N/V.  Denies numbness or tingling.  No other complaints at this time.  Objective:   VITALS:   Vitals:   09/23/23 1636 09/23/23 2202 09/24/23 0110 09/24/23 0600  BP: 129/65 121/70 (!) 142/81 134/72  Pulse:  83 94 81  Resp: 16 16 17 15   Temp: (!) 97.5 F (36.4 C) 97.7 F (36.5 C) 99.2 F (37.3 C) 98.4 F (36.9 C)  TempSrc: Oral Oral Oral Oral  SpO2: 97% 96% 99% 95%  Weight:      Height:        Resting comfortably, AAOx4, in NAD Neurologically intact Sensation intact distally Intact pulses distally Dorsiflexion/Plantar flexion intact Incision: dressing C/D/I Wiggles toes appropriately   Lab Results  Component Value Date   WBC 5.3 09/10/2023   HGB 11.9 (L) 09/10/2023   HCT 37.4 09/10/2023   MCV 92.8 09/10/2023   PLT 298 09/10/2023   BMET    Component Value Date/Time   NA 138 09/10/2023 1341   NA 139 07/15/2023 1307   K 4.0 09/10/2023 1341   CL 104 09/10/2023 1341   CO2 26 09/10/2023 1341   GLUCOSE 82 09/10/2023 1341   BUN 10 09/10/2023 1341   BUN 11 07/15/2023 1307   CREATININE 1.01 (H) 09/10/2023 1341   CALCIUM 9.6 09/10/2023 1341   EGFR 57 (L) 07/15/2023 1307   GFRNONAA >60 09/10/2023 1341     Xray: stable post-operative imaging  Assessment/Plan: 1 Day Post-Op   Principal Problem:   Status post left hip replacement  Advance diet Up with therapy Incentive Spirometry Elevate and Apply ice   Post op recs: WB: WBAT LLE Abx: ancef  x23 hours post op Imaging: PACU xrays Dressing: keep intact until follow up, change PRN if soiled or saturated. DVT prophylaxis: Aspirin, SCDs, ambulation Follow up: 2 weeks after surgery for a wound check with Dr. Pryor Browning at [facility].  DC: home with OPPT, likely  today Address: [address], [place], [place] [address]  Office Phone: [phone]      Albertus Alt 09/24/2023, 6:35 AM   Contact information:   Weekdays 7am-5pm epic message Dr. Pryor Browning, or call office for patient follow up: [phone] After hours and holidays please check [email or web address] for group call information for [facility]

## 2023-09-24 NOTE — Progress Notes (Signed)
 Chaplains received a consult to assist Candice Watkins with notarizing her advance directives.  Notarization was completed and a copy was placed in her chart and another scanned to [employer] Documents.  The original was returned to her before she was discharged.

## 2023-09-24 NOTE — TOC Transition Note (Signed)
 Transition of Care [facility]) - Discharge Note   Patient Details  Name: Candice Watkins MRN: [id number] Date of Birth: May 20, 1971  Transition of Care [facility]) CM/SW Contact:  Delilah Fend, LCSW Phone Number: 09/24/2023, 10:31 AM   Clinical Narrative:     Met with pt and confirming she has received RW to room via [employer].  OPPT already arranged with [facility].)  Pt requested notary for [facility] POA paperwork and have sent referral for chaplain assistance.  No further TOC needs.  Final next level of care: OP Rehab Barriers to Discharge: No Barriers Identified   Patient Goals and CMS Choice Patient states their goals for this hospitalization and ongoing recovery are:: return home          Discharge Placement                       Discharge Plan and Services Additional resources added to the After Visit Summary for                  DME Arranged: Walker rolling DME Agency: [employer]                  Social Drivers of Health (SDOH) Interventions SDOH Screenings   Food Insecurity: No Food Insecurity (09/23/2023)  Housing: Low Risk  (09/23/2023)  Transportation Needs: No Transportation Needs (09/23/2023)  Utilities: Not At Risk (09/23/2023)  Alcohol Screen: Low Risk  (04/19/2017)  Depression (PHQ2-9): High Risk (09/10/2022)  Tobacco Use: Low Risk  (09/23/2023)     Readmission Risk Interventions     No data to display

## 2023-09-24 NOTE — Progress Notes (Signed)
 Discharge instructions given to patient, questions asked and answered. Sylvia Everts RN

## 2023-09-26 ENCOUNTER — Ambulatory Visit: Attending: Orthopedic Surgery

## 2023-09-26 DIAGNOSIS — M6281 Muscle weakness (generalized): Secondary | ICD-10-CM | POA: Diagnosis present

## 2023-09-26 DIAGNOSIS — M25552 Pain in left hip: Secondary | ICD-10-CM | POA: Insufficient documentation

## 2023-09-26 DIAGNOSIS — R2689 Other abnormalities of gait and mobility: Secondary | ICD-10-CM | POA: Insufficient documentation

## 2023-09-29 NOTE — Therapy (Unsigned)
 OUTPATIENT PHYSICAL THERAPY TREATMENT NOTE   Patient Name: Candice Watkins MRN: [id number] DOB:[birth year], 53 y.o., female Today's Date: 09/30/2023  END OF SESSION:  PT End of Session - 09/30/23 1134     Visit Number 2    Number of Visits 12    Date for PT Re-Evaluation 11/26/23    Authorization Type [employer] MCR    PT Start Time 1130    PT Stop Time 1215    PT Time Calculation (min) 45 min    Activity Tolerance Patient tolerated treatment well    Behavior During Therapy WFL for tasks assessed/performed              Past Medical History:  Diagnosis Date   Anxiety    Arthritis    "knees, ankles" (10/11/2015)   Asthma    Depression    Diabetes mellitus without complication (HCC)    Heartburn    HLD (hyperlipidemia)    Hypertension    Hypothyroidism    Kidney stones    Migraine    "monthly" (10/11/2015)   PONV (postoperative nausea and vomiting) X 1   Pre-diabetes    Primary hyperparathyroidism (HCC)    Seasonal allergies    Sleep apnea    Stomach ulcer    Past Surgical History:  Procedure Laterality Date   BREAST BIOPSY Bilateral    neg- core   CESAREAN SECTION  2000   COLONOSCOPY WITH PROPOFOL  N/A 05/02/2022   Procedure: COLONOSCOPY WITH PROPOFOL ;  Surgeon: Selena Daily, MD;  Location: [facility] ENDOSCOPY;  Service: Gastroenterology;  Laterality: N/A;   COLONOSCOPY WITH PROPOFOL  N/A 07/22/2022   Procedure: COLONOSCOPY WITH PROPOFOL ;  Surgeon: Selena Daily, MD;  Location: [facility] ENDOSCOPY;  Service: Gastroenterology;  Laterality: N/A;   ESOPHAGOGASTRODUODENOSCOPY (EGD) WITH PROPOFOL  N/A 05/02/2022   Procedure: ESOPHAGOGASTRODUODENOSCOPY (EGD) WITH PROPOFOL ;  Surgeon: Selena Daily, MD;  Location: [facility] ENDOSCOPY;  Service: Gastroenterology;  Laterality: N/A;   JOINT REPLACEMENT     KNEE ARTHROSCOPY Right    THYROID  SURGERY     "removed tumor"   TOTAL HIP ARTHROPLASTY Left 09/23/2023   Procedure: ARTHROPLASTY, HIP, TOTAL, ANTERIOR APPROACH;   Surgeon: [name], MD;  Location: [facility] ORS;  Service: Orthopedics;  Laterality: Left;   TOTAL KNEE ARTHROPLASTY Right 10/11/2015   TOTAL KNEE ARTHROPLASTY Right 10/11/2015   Procedure: TOTAL KNEE ARTHROPLASTY;  Surgeon: Ferd Householder, MD;  Location: [facility] OR;  Service: Orthopedics;  Laterality: Right;   TUBAL LIGATION     VAGINAL HYSTERECTOMY     Patient Active Problem List   Diagnosis Date Noted   Status post left hip replacement 09/23/2023   Obesity, morbid (HCC) 06/18/2023   Prediabetes 12/11/2022   Mixed hyperlipidemia 12/11/2022   Essential hypertension, benign 12/11/2022   Vitamin D  deficiency, unspecified 12/11/2022   B12 deficiency due to diet 12/11/2022   History of colonic polyps 07/22/2022   Dyspepsia 05/02/2022   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   Screening for colon cancer 05/02/2022   Polyp of colon 05/02/2022   MDD (major depressive disorder), recurrent severe, without psychosis (HCC) 01/28/2017   S/P total knee replacement 10/11/2015   Osteoarthritis of knee 09/27/2013   Patellofemoral dysfunction 09/27/2013   Hypothyroidism (acquired) 09/14/2007   Allergic rhinitis 03/04/2006    PCP: Trenda Frisk, FNP   REFERRING PROVIDER: [name], MD  REFERRING DIAG: S/P L ant total hip replacement PT SHOULD START 09/26/23  THERAPY DIAG:  Pain in left hip  Muscle weakness (generalized)  Other abnormalities  of gait and mobility  Rationale for Evaluation and Treatment: Rehabilitation  ONSET DATE: 09/23/23  SUBJECTIVE:   SUBJECTIVE STATEMENT: Pivoted wrong on LLE and notes some increased soreness, 7/10 in intensity.  PERTINENT HISTORY: Candice Watkins is a 53 y.o. 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 The patient has a history of pain and functional disability in the left hip due to arthritis and has failed non-surgical conservative treatments for greater than 12 weeks to include NSAID's and/or analgesics, corticosteriod injections,  supervised PT with diminished ADL's post treatment, use of assistive devices, weight reduction as appropriate, and activity modification.  Onset of symptoms was gradual, starting 1 year ago with rapidlly worsening course since that time. The patient noted no past surgery on the left hip.  Patient currently rates pain at 10 out of 10 with activity. Patient has night pain, worsening of pain with activity and weight bearing, and pain that interferes with activities of daily living.  Patient has evidence of subchondral cysts, subchondral sclerosis, periarticular osteophytes, and joint space narrowing by imaging studies.  There is no active infection.  PAIN:  Are you having pain? Yes: NPRS scale: 6/10 Pain location: L hip Pain description: sore Aggravating factors: activity Relieving factors: ice, meds, rest  PRECAUTIONS: Anterior hip  RED FLAGS: None   WEIGHT BEARING RESTRICTIONS: No  FALLS:  Has patient fallen in last 6 months? No  LIVING ENVIRONMENT: Lives with: lives with their family Lives in: House/apartment Stairs: 2  Has following equipment at home: Environmental consultant - 2 wheeled  OCCUPATION: not working  PLOF: Independent  PATIENT GOALS: To regain function in my hip  NEXT MD VISIT: 10/08/23  OBJECTIVE:  Note: Objective measures were completed at Evaluation unless otherwise noted.  DIAGNOSTIC FINDINGS: n/a  PATIENT SURVEYS:  LEFS 14/80  MUSCLE LENGTH: Not tested  POSTURE: No Significant postural limitations  PALPATION: Deferred post-op  LOWER EXTREMITY ROM:  A/PROM Right eval Left eval  Hip flexion  70/90d  Hip extension    Hip abduction    Hip adduction    Hip internal rotation    Hip external rotation    Knee flexion    Knee extension    Ankle dorsiflexion    Ankle plantarflexion    Ankle inversion    Ankle eversion     (Blank rows = not tested)  LOWER EXTREMITY MMT:  MMT Right eval Left eval  Hip flexion  3  Hip extension    Hip abduction  3  Hip  adduction    Hip internal rotation    Hip external rotation    Knee flexion    Knee extension    Ankle dorsiflexion    Ankle plantarflexion    Ankle inversion    Ankle eversion     (Blank rows = not tested)  LOWER EXTREMITY SPECIAL TESTS:  Deferred post-op  FUNCTIONAL TESTS:  30 seconds chair stand test 4 arms crossed  GAIT: Distance walked: 31ftx2 Assistive device utilized: Environmental consultant - 2 wheeled Level of assistance: Complete Independence Comments: slow cadence step through pattern  09/26/23 2 MWT 190 ft with RW  TREATMENT DATE:  [facility] Adult PT Treatment:                                                DATE: 09/30/23 Therapeutic Exercise: Nustep L2 8 min  Neuromuscular re-ed: FAQs with adduction 15x Supine hip fallouts GTB 10x B, 10/10 unilaterally R S/L clams GTB 10x Bridge against GTB 10x Bridge w/ball 10x Tandem stepping and WS 4 in step 10/10 weaning UE support. Therapeutic Activity: Heel slides with strap 10x2 QS's 3x 10x SLR L (unable) SAQ 2# 10x   [facility] Adult PT Treatment:                                                DATE: 09/26/23 Eval and HEP Self Care: Additional minutes spent for educating on updated Therapeutic Home Exercise Program as well as comparing current status to condition at start of symptoms. This included exercises focusing on stretching, strengthening, with focus on eccentric aspects. Long term goals include an improvement in range of motion, strength, endurance as well as avoiding reinjury. Patient's frequency would include in 1-2 times a day, 3-5 times a week for a duration of 6-12 weeks. Proper technique shown and discussed handout in great detail. All questions were discussed and addressed.     PATIENT EDUCATION:  Education details: Discussed eval findings, rehab rationale and POC and patient is in agreement  Person  educated: Patient Education method: Explanation Education comprehension: verbalized understanding and needs further education  HOME EXERCISE PROGRAM: Access Code: [email or web address] URL: [email or web address] Date: 09/26/2023 Prepared by: Gretta Leavens  Exercises - Supine Heel Slide with Strap  - 2-3 x daily - 5 x weekly - 1 sets - 10 reps - Supine Quad Set  - 2-3 x daily - 5 x weekly - 1 sets - 10 reps - 3s hold - Standing Heel Raise with Support  - 2-3 x daily - 5 x weekly - 1 sets - 10 reps - Bent Knee Fallouts  - 2-3 x daily - 5 x weekly - 1 sets - 10 reps  ASSESSMENT:  CLINICAL IMPRESSION: Focus of session was aerobic work f/b stretching and gentle strengthening to accommodate elevated soreness.  Patient able to complete all requested tasks w/o exacerbating symptoms.  Advanced to stepping and WS to challenge proprioception.   Patient is a 53 y.o. female who was seen today for physical therapy evaluation and treatment s/p THA. Patient presents with good gait pattern, minimal pain levels and compliance with HEP.  ROM and strength deficits identified.  2 MWT established for baseline.  30s chair stand test tolerated w/o UE support needed.  LEFS identifies functional deficits.  OBJECTIVE IMPAIRMENTS: Abnormal gait, decreased activity tolerance, decreased balance, decreased endurance, decreased knowledge of use of DME, difficulty walking, decreased ROM, decreased strength, improper body mechanics, obesity, and pain.   ACTIVITY LIMITATIONS: carrying, lifting, standing, squatting, stairs, transfers, bed mobility, and bathing  PERSONAL FACTORS: Fitness, Past/current experiences, and 1 comorbidity: DM are also affecting patient's functional outcome.   REHAB POTENTIAL: Good  CLINICAL DECISION MAKING: Stable/uncomplicated  EVALUATION COMPLEXITY: Low   GOALS: Goals reviewed with patient? No  SHORT TERM GOALS: Target date: 10/16/2023   Patient to demonstrate independence in HEP   Baseline: [id number]  Goal status: INITIAL  2.  Increase distance on 2 MWT with LRAD Baseline: 128ft w/RW Goal status: INITIAL    LONG TERM GOALS: Target date: 11/06/2023  Patient will increase 30s chair stand reps from 4 to 7 without arms to demonstrate and improved functional ability with less pain/difficulty as well as reduce fall risk.  Baseline: 4 Goal status: INITIAL  2.  Patient will acknowledge 4/10 pain at least once during episode of care   Baseline: 6/10 Goal status: INITIAL  3.  Patient will score at least 40/80 on LEFS to signify clinically meaningful improvement in functional abilities.   Baseline: 14/80 Goal status: INITIAL  4.  Patient to negotiate 16 steps with most appropriate pattern. Baseline: TBD Goal status: INITIAL  5.  110d AROM L hip flexion Baseline: 70d Goal status: INITIAL  6.  4/5 L hip strength  Baseline: 3/5 Goal status: INITIAL   PLAN:  PT FREQUENCY: 2x/week  PT DURATION: 6 weeks  PLANNED INTERVENTIONS: 97164- PT Re-evaluation, 97110-Therapeutic exercises, 97530- Therapeutic activity, 97112- Neuromuscular re-education, 97535- Self Care, 11914- Manual therapy, [phone]- Gait training, Patient/Family education, Balance training, and Stair training  PLAN FOR NEXT SESSION: HEP review and update, manual techniques as appropriate, aerobic tasks, ROM and flexibility activities, strengthening and PREs, TPDN, gait and balance training as needed     Eldon Greenland, PT 09/30/2023, 12:12 PM

## 2023-09-30 ENCOUNTER — Ambulatory Visit

## 2023-09-30 DIAGNOSIS — R2689 Other abnormalities of gait and mobility: Secondary | ICD-10-CM

## 2023-09-30 DIAGNOSIS — M25552 Pain in left hip: Secondary | ICD-10-CM | POA: Diagnosis not present

## 2023-09-30 DIAGNOSIS — M6281 Muscle weakness (generalized): Secondary | ICD-10-CM

## 2023-09-30 NOTE — Therapy (Unsigned)
 OUTPATIENT PHYSICAL THERAPY TREATMENT NOTE   Patient Name: Candice Watkins MRN: [id number] DOB:27-Feb-1971, 53 y.o., female Today's Date: 10/02/2023  END OF SESSION:  PT End of Session - 10/02/23 1134     Visit Number 3    Number of Visits 12    Date for PT Re-Evaluation 11/26/23    Authorization Type [employer] MCR    PT Start Time 1130    PT Stop Time 1215    PT Time Calculation (min) 45 min    Activity Tolerance Patient tolerated treatment well    Behavior During Therapy WFL for tasks assessed/performed               Past Medical History:  Diagnosis Date   Anxiety    Arthritis    "knees, ankles" (10/11/2015)   Asthma    Depression    Diabetes mellitus without complication (HCC)    Heartburn    HLD (hyperlipidemia)    Hypertension    Hypothyroidism    Kidney stones    Migraine    "monthly" (10/11/2015)   PONV (postoperative nausea and vomiting) X 1   Pre-diabetes    Primary hyperparathyroidism (HCC)    Seasonal allergies    Sleep apnea    Stomach ulcer    Past Surgical History:  Procedure Laterality Date   BREAST BIOPSY Bilateral    neg- core   CESAREAN SECTION  2000   COLONOSCOPY WITH PROPOFOL  N/A 05/02/2022   Procedure: COLONOSCOPY WITH PROPOFOL ;  Surgeon: Selena Daily, MD;  Location: [facility] ENDOSCOPY;  Service: Gastroenterology;  Laterality: N/A;   COLONOSCOPY WITH PROPOFOL  N/A 07/22/2022   Procedure: COLONOSCOPY WITH PROPOFOL ;  Surgeon: Selena Daily, MD;  Location: [facility] ENDOSCOPY;  Service: Gastroenterology;  Laterality: N/A;   ESOPHAGOGASTRODUODENOSCOPY (EGD) WITH PROPOFOL  N/A 05/02/2022   Procedure: ESOPHAGOGASTRODUODENOSCOPY (EGD) WITH PROPOFOL ;  Surgeon: Selena Daily, MD;  Location: [facility] ENDOSCOPY;  Service: Gastroenterology;  Laterality: N/A;   JOINT REPLACEMENT     KNEE ARTHROSCOPY Right    THYROID  SURGERY     "removed tumor"   TOTAL HIP ARTHROPLASTY Left 09/23/2023   Procedure: ARTHROPLASTY, HIP, TOTAL, ANTERIOR APPROACH;   Surgeon: [name], MD;  Location: [facility] ORS;  Service: Orthopedics;  Laterality: Left;   TOTAL KNEE ARTHROPLASTY Right 10/11/2015   TOTAL KNEE ARTHROPLASTY Right 10/11/2015   Procedure: TOTAL KNEE ARTHROPLASTY;  Surgeon: Ferd Householder, MD;  Location: [facility] OR;  Service: Orthopedics;  Laterality: Right;   TUBAL LIGATION     VAGINAL HYSTERECTOMY     Patient Active Problem List   Diagnosis Date Noted   Status post left hip replacement 09/23/2023   Obesity, morbid (HCC) 06/18/2023   Prediabetes 12/11/2022   Mixed hyperlipidemia 12/11/2022   Essential hypertension, benign 12/11/2022   Vitamin D  deficiency, unspecified 12/11/2022   B12 deficiency due to diet 12/11/2022   History of colonic polyps 07/22/2022   Dyspepsia 05/02/2022   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   Screening for colon cancer 05/02/2022   Polyp of colon 05/02/2022   MDD (major depressive disorder), recurrent severe, without psychosis (HCC) 01/28/2017   S/P total knee replacement 10/11/2015   Osteoarthritis of knee 09/27/2013   Patellofemoral dysfunction 09/27/2013   Hypothyroidism (acquired) 09/14/2007   Allergic rhinitis 03/04/2006    PCP: Trenda Frisk, FNP   REFERRING PROVIDER: [name], MD  REFERRING DIAG: S/P L ant total hip replacement PT SHOULD START 09/26/23  THERAPY DIAG:  Pain in left hip  Muscle weakness (generalized)  Other  abnormalities of gait and mobility  Rationale for Evaluation and Treatment: Rehabilitation  ONSET DATE: 09/23/23  SUBJECTIVE:   SUBJECTIVE STATEMENT: Did well after first f/u, only mild soreness lasting the day.  PERTINENT HISTORY: Candice Watkins is a 53 y.o. 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 The patient has a history of pain and functional disability in the left hip due to arthritis and has failed non-surgical conservative treatments for greater than 12 weeks to include NSAID's and/or analgesics, corticosteriod injections, supervised PT with  diminished ADL's post treatment, use of assistive devices, weight reduction as appropriate, and activity modification.  Onset of symptoms was gradual, starting 1 year ago with rapidlly worsening course since that time. The patient noted no past surgery on the left hip.  Patient currently rates pain at 10 out of 10 with activity. Patient has night pain, worsening of pain with activity and weight bearing, and pain that interferes with activities of daily living.  Patient has evidence of subchondral cysts, subchondral sclerosis, periarticular osteophytes, and joint space narrowing by imaging studies.  There is no active infection.  PAIN:  Are you having pain? Yes: NPRS scale: 6/10 Pain location: L hip Pain description: sore Aggravating factors: activity Relieving factors: ice, meds, rest  PRECAUTIONS: Anterior hip  RED FLAGS: None   WEIGHT BEARING RESTRICTIONS: No  FALLS:  Has patient fallen in last 6 months? No  LIVING ENVIRONMENT: Lives with: lives with their family Lives in: House/apartment Stairs: 2  Has following equipment at home: Environmental consultant - 2 wheeled  OCCUPATION: not working  PLOF: Independent  PATIENT GOALS: To regain function in my hip  NEXT MD VISIT: 10/08/23  OBJECTIVE:  Note: Objective measures were completed at Evaluation unless otherwise noted.  DIAGNOSTIC FINDINGS: n/a  PATIENT SURVEYS:  LEFS 14/80  MUSCLE LENGTH: Not tested  POSTURE: No Significant postural limitations  PALPATION: Deferred post-op  LOWER EXTREMITY ROM:  A/PROM Right eval Left eval  Hip flexion  70/90d  Hip extension    Hip abduction    Hip adduction    Hip internal rotation    Hip external rotation    Knee flexion    Knee extension    Ankle dorsiflexion    Ankle plantarflexion    Ankle inversion    Ankle eversion     (Blank rows = not tested)  LOWER EXTREMITY MMT:  MMT Right eval Left eval  Hip flexion  3  Hip extension    Hip abduction  3  Hip adduction    Hip  internal rotation    Hip external rotation    Knee flexion    Knee extension    Ankle dorsiflexion    Ankle plantarflexion    Ankle inversion    Ankle eversion     (Blank rows = not tested)  LOWER EXTREMITY SPECIAL TESTS:  Deferred post-op  FUNCTIONAL TESTS:  30 seconds chair stand test 4 arms crossed  GAIT: Distance walked: 51ftx2 Assistive device utilized: Environmental consultant - 2 wheeled Level of assistance: Complete Independence Comments: slow cadence step through pattern  09/26/23 2 MWT 190 ft with RW  TREATMENT DATE:  [facility] Adult PT Treatment:                                                DATE: 10/02/23 Therapeutic Exercise: Nustep L3 8 min L seated hamstring stretch 30s x2 Neuromuscular re-ed: FAQs with adduction 15x2 Supine hip fallouts GTB 15x B, 15/15 unilaterally R S/L clams GTB 15x Bridge against GTB 15x Bridge w/ball 15x Tandem stepping and WS 4 in step 10/10 weaning UE support. Therapeutic Activity: Heel slides with strap 15x QS's 3x 15x SAQ 3# 15x   [facility] Adult PT Treatment:                                                DATE: 09/30/23 Therapeutic Exercise: Nustep L2 8 min  Neuromuscular re-ed: FAQs with adduction 15x Supine hip fallouts GTB 10x B, 10/10 unilaterally R S/L clams GTB 10x Bridge against GTB 10x Bridge w/ball 10x Tandem stepping and WS 4 in step 10/10 weaning UE support. Therapeutic Activity: Heel slides with strap 10x2 QS's 3x 10x SLR L (unable) SAQ 2# 10x   [facility] Adult PT Treatment:                                                DATE: 09/26/23 Eval and HEP Self Care: Additional minutes spent for educating on updated Therapeutic Home Exercise Program as well as comparing current status to condition at start of symptoms. This included exercises focusing on stretching, strengthening, with focus on eccentric aspects. Long  term goals include an improvement in range of motion, strength, endurance as well as avoiding reinjury. Patient's frequency would include in 1-2 times a day, 3-5 times a week for a duration of 6-12 weeks. Proper technique shown and discussed handout in great detail. All questions were discussed and addressed.     PATIENT EDUCATION:  Education details: Discussed eval findings, rehab rationale and POC and patient is in agreement  Person educated: Patient Education method: Explanation Education comprehension: verbalized understanding and needs further education  HOME EXERCISE PROGRAM: Access Code: [email or web address] URL: [email or web address] Date: 09/26/2023 Prepared by: Gretta Leavens  Exercises - Supine Heel Slide with Strap  - 2-3 x daily - 5 x weekly - 1 sets - 10 reps - Supine Quad Set  - 2-3 x daily - 5 x weekly - 1 sets - 10 reps - 3s hold - Standing Heel Raise with Support  - 2-3 x daily - 5 x weekly - 1 sets - 10 reps - Bent Knee Fallouts  - 2-3 x daily - 5 x weekly - 1 sets - 10 reps  ASSESSMENT:  CLINICAL IMPRESSION: fatigued today as she did not sleep well.  Able to complete all requested tasks including additional reps and resistance w/o issue.  Recommended muscle relaxers to assist with sleep.   Patient is a 53 y.o. female who was seen today for physical therapy evaluation and treatment s/p THA. Patient presents with good gait pattern, minimal pain levels and compliance with HEP.  ROM and strength deficits identified.  2 MWT established for baseline.  30s chair stand  test tolerated w/o UE support needed.  LEFS identifies functional deficits.  OBJECTIVE IMPAIRMENTS: Abnormal gait, decreased activity tolerance, decreased balance, decreased endurance, decreased knowledge of use of DME, difficulty walking, decreased ROM, decreased strength, improper body mechanics, obesity, and pain.   ACTIVITY LIMITATIONS: carrying, lifting, standing, squatting, stairs, transfers, bed  mobility, and bathing  PERSONAL FACTORS: Fitness, Past/current experiences, and 1 comorbidity: DM are also affecting patient's functional outcome.   REHAB POTENTIAL: Good  CLINICAL DECISION MAKING: Stable/uncomplicated  EVALUATION COMPLEXITY: Low   GOALS: Goals reviewed with patient? No  SHORT TERM GOALS: Target date: 10/16/2023   Patient to demonstrate independence in HEP  Baseline: [id number] Goal status: INITIAL  2.  Increase distance on 2 MWT with LRAD Baseline: 148ft w/RW Goal status: INITIAL    LONG TERM GOALS: Target date: 11/06/2023  Patient will increase 30s chair stand reps from 4 to 7 without arms to demonstrate and improved functional ability with less pain/difficulty as well as reduce fall risk.  Baseline: 4 Goal status: INITIAL  2.  Patient will acknowledge 4/10 pain at least once during episode of care   Baseline: 6/10 Goal status: INITIAL  3.  Patient will score at least 40/80 on LEFS to signify clinically meaningful improvement in functional abilities.   Baseline: 14/80 Goal status: INITIAL  4.  Patient to negotiate 16 steps with most appropriate pattern. Baseline: TBD Goal status: INITIAL  5.  110d AROM L hip flexion Baseline: 70d Goal status: INITIAL  6.  4/5 L hip strength  Baseline: 3/5 Goal status: INITIAL   PLAN:  PT FREQUENCY: 2x/week  PT DURATION: 6 weeks  PLANNED INTERVENTIONS: 97164- PT Re-evaluation, 97110-Therapeutic exercises, 97530- Therapeutic activity, 97112- Neuromuscular re-education, 97535- Self Care, 16109- Manual therapy, [phone]- Gait training, Patient/Family education, Balance training, and Stair training  PLAN FOR NEXT SESSION: HEP review and update, manual techniques as appropriate, aerobic tasks, ROM and flexibility activities, strengthening and PREs, TPDN, gait and balance training as needed     Eldon Greenland, PT 10/02/2023, 12:13 PM

## 2023-10-02 ENCOUNTER — Ambulatory Visit

## 2023-10-02 DIAGNOSIS — M25552 Pain in left hip: Secondary | ICD-10-CM

## 2023-10-02 DIAGNOSIS — R2689 Other abnormalities of gait and mobility: Secondary | ICD-10-CM

## 2023-10-02 DIAGNOSIS — M6281 Muscle weakness (generalized): Secondary | ICD-10-CM

## 2023-10-06 ENCOUNTER — Other Ambulatory Visit: Payer: Self-pay | Admitting: Family

## 2023-10-06 NOTE — Therapy (Signed)
 OUTPATIENT PHYSICAL THERAPY TREATMENT NOTE   Patient Name: HARUNA ROHLFS MRN: [id number] DOB:12/06/1970, 53 y.o., female Today's Date: 10/07/2023  END OF SESSION:  PT End of Session - 10/07/23 1135     Visit Number 4    Number of Visits 12    Date for PT Re-Evaluation 11/26/23    Authorization Type [employer]et[employer] MCR    PT Start Time 1135    PT Stop Time 1215    PT Time Calculation (min) 40 min    Activity Tolerance Patient tolerated treatment well    Behavior During Therapy [facility] for tasks assessed/performed                Past Medical History:  Diagnosis Date   Anxiety    Arthritis    "knees, ankles" (10/11/2015)   Asthma    Depression    Diabetes mellitus without complication (HCC)    Heartburn    HLD (hyperlipidemia)    Hypertension    Hypothyroidism    Kidney stones    Migraine    "monthly" (10/11/2015)   PONV (postoperative nausea and vomiting) X 1   Pre-diabetes    Primary hyperparathyroidism (HCC)    Seasonal allergies    Sleep apnea    Stomach ulcer    Past Surgical History:  Procedure Laterality Date   BREAST BIOPSY Bilateral    neg- core   CESAREAN SECTION  2000   COLONOSCOPY WITH PROPOFOL  N/A 05/02/2022   Procedure: COLONOSCOPY WITH PROPOFOL ;  Surgeon: Selena Daily, MD;  Location: [facility] ENDOSCOPY;  Service: Gastroenterology;  Laterality: N/A;   COLONOSCOPY WITH PROPOFOL  N/A 07/22/2022   Procedure: COLONOSCOPY WITH PROPOFOL ;  Surgeon: Selena Daily, MD;  Location: [facility] ENDOSCOPY;  Service: Gastroenterology;  Laterality: N/A;   ESOPHAGOGASTRODUODENOSCOPY (EGD) WITH PROPOFOL  N/A 05/02/2022   Procedure: ESOPHAGOGASTRODUODENOSCOPY (EGD) WITH PROPOFOL ;  Surgeon: Selena Daily, MD;  Location: [facility] ENDOSCOPY;  Service: Gastroenterology;  Laterality: N/A;   JOINT REPLACEMENT     KNEE ARTHROSCOPY Right    THYROID  SURGERY     "removed tumor"   TOTAL HIP ARTHROPLASTY Left 09/23/2023   Procedure: ARTHROPLASTY, HIP, TOTAL, ANTERIOR APPROACH;   Surgeon: [name], MD;  Location: [facility] ORS;  Service: Orthopedics;  Laterality: Left;   TOTAL KNEE ARTHROPLASTY Right 10/11/2015   TOTAL KNEE ARTHROPLASTY Right 10/11/2015   Procedure: TOTAL KNEE ARTHROPLASTY;  Surgeon: Ferd Householder, MD;  [facility] OR;  Service: Orthopedics;  Laterality: Right;   TUBAL LIGATION     VAGINAL HYSTERECTOMY     Patient Active Problem List   Diagnosis Date Noted   Status post left hip replacement 09/23/2023   Obesity, morbid (HCC) 06/18/2023   Prediabetes 12/11/2022   Mixed hyperlipidemia 12/11/2022   Essential hypertension, benign 12/11/2022   Vitamin D  deficiency, unspecified 12/11/2022   B12 deficiency due to diet 12/11/2022   History of colonic polyps 07/22/2022   Dyspepsia 05/02/2022   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   Screening for colon cancer 05/02/2022   Polyp of colon 05/02/2022   MDD (major depressive disorder), recurrent severe, without psychosis (HCC) 01/28/2017   S/P total knee replacement 10/11/2015   Osteoarthritis of knee 09/27/2013   Patellofemoral dysfunction 09/27/2013   Hypothyroidism (acquired) 09/14/2007   Allergic rhinitis 03/04/2006    PCP: Trenda Frisk, FNP   REFERRING PROVIDER: [name], MD  REFERRING DIAG: S/P L ant total hip replacement PT SHOULD START 09/26/23  THERAPY DIAG:  Pain in left hip  Muscle weakness (generalized)  Other abnormalities of gait and mobility  Rationale for Evaluation and Treatment: Rehabilitation  ONSET DATE: 09/23/23  SUBJECTIVE:   SUBJECTIVE STATEMENT: Transitioned to cane on her own and feels confident in using it.  Was able to use tub shower but struggled to lift leg over tub wall.     PERTINENT HISTORY: Dannon P Cataldo is a 53 y.o. 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 The patient has a history of pain and functional disability in the left hip due to arthritis and has failed non-surgical conservative treatments for greater than 12 weeks to  include NSAID's and/or analgesics, corticosteriod injections, supervised PT with diminished ADL's post treatment, use of assistive devices, weight reduction as appropriate, and activity modification.  Onset of symptoms was gradual, starting 1 year ago with rapidlly worsening course since that time. The patient noted no past surgery on the left hip.  Patient currently rates pain at 10 out of 10 with activity. Patient has night pain, worsening of pain with activity and weight bearing, and pain that interferes with activities of daily living.  Patient has evidence of subchondral cysts, subchondral sclerosis, periarticular osteophytes, and joint space narrowing by imaging studies.  There is no active infection.  PAIN:  Are you having pain? Yes: NPRS scale: 6/10 Pain location: L hip Pain description: sore Aggravating factors: activity Relieving factors: ice, meds, rest  PRECAUTIONS: Anterior hip  RED FLAGS: None   WEIGHT BEARING RESTRICTIONS: No  FALLS:  Has patient fallen in last 6 months? No  LIVING ENVIRONMENT: Lives with: lives with their family Lives in: House/apartment Stairs: 2  Has following equipment at home: Environmental consultant - 2 wheeled  OCCUPATION: not working  PLOF: Independent  PATIENT GOALS: To regain function in my hip  NEXT MD VISIT: 10/08/23  OBJECTIVE:  Note: Objective measures were completed at Evaluation unless otherwise noted.  DIAGNOSTIC FINDINGS: n/a  PATIENT SURVEYS:  LEFS 14/80  MUSCLE LENGTH: Not tested  POSTURE: No Significant postural limitations  PALPATION: Deferred post-op  LOWER EXTREMITY ROM:  A/PROM Right eval Left eval  Hip flexion  70/90d  Hip extension    Hip abduction    Hip adduction    Hip internal rotation    Hip external rotation    Knee flexion    Knee extension    Ankle dorsiflexion    Ankle plantarflexion    Ankle inversion    Ankle eversion     (Blank rows = not tested)  LOWER EXTREMITY MMT:  MMT Right eval Left eval   Hip flexion  3  Hip extension    Hip abduction  3  Hip adduction    Hip internal rotation    Hip external rotation    Knee flexion    Knee extension    Ankle dorsiflexion    Ankle plantarflexion    Ankle inversion    Ankle eversion     (Blank rows = not tested)  LOWER EXTREMITY SPECIAL TESTS:  Deferred post-op  FUNCTIONAL TESTS:  30 seconds chair stand test 4 arms crossed  GAIT: Distance walked: 23ftx2 Assistive device utilized: Environmental consultant - 2 wheeled Level of assistance: Complete Independence Comments: slow cadence step through pattern  09/26/23 2 MWT 190 ft with RW  TREATMENT DATE:  [facility] Adult PT Treatment:                                                DATE: 10/07/23 Therapeutic Exercise: Seated hamstring stretch 30s x2 Nustep L4 8 min Neuromuscular re-ed: Supine march 15/15 Supine hip fallouts BluTB 10x B, 10/10 unilaterally R S/L clams BluTB 10x Bridge against BluTB 10x Bridge w/ball 15x Therapeutic Activity: FAQs with adduction 15x Stair negotiation 4 steps with cane and rail step to pattern  [facility] Adult PT Treatment:                                                DATE: 10/02/23 Therapeutic Exercise: Nustep L3 8 min L seated hamstring stretch 30s x2 Neuromuscular re-ed: FAQs with adduction 15x2 Supine hip fallouts GTB 15x B, 15/15 unilaterally R S/L clams GTB 15x Bridge against GTB 15x Bridge w/ball 15x Tandem stepping and WS 4 in step 10/10 weaning UE support. Therapeutic Activity: Heel slides with strap 15x QS's 3x 15x SAQ 3# 15x   [facility] Adult PT Treatment:                                                DATE: 09/30/23 Therapeutic Exercise: Nustep L2 8 min  Neuromuscular re-ed: FAQs with adduction 15x Supine hip fallouts GTB 10x B, 10/10 unilaterally R S/L clams GTB 10x Bridge against GTB 10x Bridge w/ball 10x Tandem  stepping and WS 4 in step 10/10 weaning UE support. Therapeutic Activity: Heel slides with strap 10x2 QS's 3x 10x SLR L (unable) SAQ 2# 10x   [facility] Adult PT Treatment:                                                DATE: 09/26/23 Eval and HEP Self Care: Additional minutes spent for educating on updated Therapeutic Home Exercise Program as well as comparing current status to condition at start of symptoms. This included exercises focusing on stretching, strengthening, with focus on eccentric aspects. Long term goals include an improvement in range of motion, strength, endurance as well as avoiding reinjury. Patient's frequency would include in 1-2 times a day, 3-5 times a week for a duration of 6-12 weeks. Proper technique shown and discussed handout in great detail. All questions were discussed and addressed.     PATIENT EDUCATION:  Education details: Discussed eval findings, rehab rationale and POC and patient is in agreement  Person educated: Patient Education method: Explanation Education comprehension: verbalized understanding and needs further education  HOME EXERCISE PROGRAM: Access Code: [email or web address] URL: [email or web address] Date: 09/26/2023 Prepared by: Gretta Leavens  Exercises - Supine Heel Slide with Strap  - 2-3 x daily - 5 x weekly - 1 sets - 10 reps - Supine Quad Set  - 2-3 x daily - 5 x weekly - 1 sets - 10 reps - 3s hold - Standing Heel Raise with Support  - 2-3 x daily - 5 x weekly - 1  sets - 10 reps - Bent Knee Fallouts  - 2-3 x daily - 5 x weekly - 1 sets - 10 reps  ASSESSMENT:  CLINICAL IMPRESSION: Continued with hip strength and stabilization tasks advancing in t-band resistance.  Incorporated hip flexion tasks to allow patient to perform tub transfers with less difficulty.  Assessed ability to negotiate 4 steps using cane and rail.  One episode of balance disturbance reported but no LOB noted.   Patient is a 53 y.o. female who was seen today for  physical therapy evaluation and treatment s/p THA. Patient presents with good gait pattern, minimal pain levels and compliance with HEP.  ROM and strength deficits identified.  2 MWT established for baseline.  30s chair stand test tolerated w/o UE support needed.  LEFS identifies functional deficits.  OBJECTIVE IMPAIRMENTS: Abnormal gait, decreased activity tolerance, decreased balance, decreased endurance, decreased knowledge of use of DME, difficulty walking, decreased ROM, decreased strength, improper body mechanics, obesity, and pain.   ACTIVITY LIMITATIONS: carrying, lifting, standing, squatting, stairs, transfers, bed mobility, and bathing  PERSONAL FACTORS: Fitness, Past/current experiences, and 1 comorbidity: DM are also affecting patient's functional outcome.   REHAB POTENTIAL: Good  CLINICAL DECISION MAKING: Stable/uncomplicated  EVALUATION COMPLEXITY: Low   GOALS: Goals reviewed with patient? No  SHORT TERM GOALS: Target date: 10/16/2023   Patient to demonstrate independence in HEP  Baseline: [id number] Goal status: INITIAL  2.  Increase distance on 2 MWT with LRAD Baseline: 151ft w/RW Goal status: INITIAL    LONG TERM GOALS: Target date: 11/06/2023  Patient will increase 30s chair stand reps from 4 to 7 without arms to demonstrate and improved functional ability with less pain/difficulty as well as reduce fall risk.  Baseline: 4 Goal status: INITIAL  2.  Patient will acknowledge 4/10 pain at least once during episode of care   Baseline: 6/10 Goal status: INITIAL  3.  Patient will score at least 40/80 on LEFS to signify clinically meaningful improvement in functional abilities.   Baseline: 14/80 Goal status: INITIAL  4.  Patient to negotiate 16 steps with most appropriate pattern. Baseline: TBD Goal status: INITIAL  5.  110d AROM L hip flexion Baseline: 70d Goal status: INITIAL  6.  4/5 L hip strength  Baseline: 3/5 Goal status: INITIAL   PLAN:  PT  FREQUENCY: 2x/week  PT DURATION: 6 weeks  PLANNED INTERVENTIONS: 97164- PT Re-evaluation, 97110-Therapeutic exercises, 97530- Therapeutic activity, 97112- Neuromuscular re-education, 97535- Self Care, [id number]- Manual therapy, [phone]- Gait training, Patient/Family education, Balance training, and Stair training  PLAN FOR NEXT SESSION: HEP review and update, manual techniques as appropriate, aerobic tasks, ROM and flexibility activities, strengthening and PREs, TPDN, gait and balance training as needed     Eldon Greenland, PT 10/07/2023, 12:12 PM

## 2023-10-07 ENCOUNTER — Ambulatory Visit

## 2023-10-07 DIAGNOSIS — M25552 Pain in left hip: Secondary | ICD-10-CM | POA: Diagnosis not present

## 2023-10-07 DIAGNOSIS — M6281 Muscle weakness (generalized): Secondary | ICD-10-CM

## 2023-10-07 DIAGNOSIS — R2689 Other abnormalities of gait and mobility: Secondary | ICD-10-CM

## 2023-10-08 DIAGNOSIS — M25562 Pain in left knee: Secondary | ICD-10-CM | POA: Diagnosis not present

## 2023-10-08 DIAGNOSIS — M1612 Unilateral primary osteoarthritis, left hip: Secondary | ICD-10-CM | POA: Diagnosis not present

## 2023-10-09 ENCOUNTER — Ambulatory Visit: Admitting: Physical Therapy

## 2023-10-09 ENCOUNTER — Other Ambulatory Visit: Payer: Self-pay | Admitting: Family

## 2023-10-15 DIAGNOSIS — F332 Major depressive disorder, recurrent severe without psychotic features: Secondary | ICD-10-CM | POA: Diagnosis not present

## 2023-10-15 NOTE — Therapy (Unsigned)
 OUTPATIENT PHYSICAL THERAPY TREATMENT NOTE   Patient Name: Candice Watkins MRN: [id number] DOB:1970/09/10, 53 y.o., female Today's Date: 10/17/2023  END OF SESSION:  PT End of Session - 10/17/23 1222     Visit Number 5    Number of Visits 12    Date for PT Re-Evaluation 11/26/23    Authorization Type [employer] MCR    PT Start Time 1215    PT Stop Time 1300    PT Time Calculation (min) 45 min    Activity Tolerance Patient tolerated treatment well    Behavior During Therapy WFL for tasks assessed/performed                 Past Medical History:  Diagnosis Date   Anxiety    Arthritis    "knees, ankles" (10/11/2015)   Asthma    Depression    Diabetes mellitus without complication (HCC)    Heartburn    HLD (hyperlipidemia)    Hypertension    Hypothyroidism    Kidney stones    Migraine    "monthly" (10/11/2015)   PONV (postoperative nausea and vomiting) X 1   Pre-diabetes    Primary hyperparathyroidism (HCC)    Seasonal allergies    Sleep apnea    Stomach ulcer    Past Surgical History:  Procedure Laterality Date   BREAST BIOPSY Bilateral    neg- core   CESAREAN SECTION  2000   COLONOSCOPY WITH PROPOFOL  N/A 05/02/2022   Procedure: COLONOSCOPY WITH PROPOFOL ;  Surgeon: Selena Daily, MD;  Location: [facility] ENDOSCOPY;  Service: Gastroenterology;  Laterality: N/A;   COLONOSCOPY WITH PROPOFOL  N/A 07/22/2022   Procedure: COLONOSCOPY WITH PROPOFOL ;  Surgeon: Selena Daily, MD;  Location: [facility] ENDOSCOPY;  Service: Gastroenterology;  Laterality: N/A;   ESOPHAGOGASTRODUODENOSCOPY (EGD) WITH PROPOFOL  N/A 05/02/2022   Procedure: ESOPHAGOGASTRODUODENOSCOPY (EGD) WITH PROPOFOL ;  Surgeon: Selena Daily, MD;  Location: [facility] ENDOSCOPY;  Service: Gastroenterology;  Laterality: N/A;   JOINT REPLACEMENT     KNEE ARTHROSCOPY Right    THYROID  SURGERY     "removed tumor"   TOTAL HIP ARTHROPLASTY Left 09/23/2023   Procedure: ARTHROPLASTY, HIP, TOTAL, ANTERIOR APPROACH;   Surgeon: [name], MD;  Location: [facility] ORS;  Service: Orthopedics;  Laterality: Left;   TOTAL KNEE ARTHROPLASTY Right 10/11/2015   TOTAL KNEE ARTHROPLASTY Right 10/11/2015   Procedure: TOTAL KNEE ARTHROPLASTY;  Surgeon: Ferd Householder, MD;  Location: [facility] OR;  Service: Orthopedics;  Laterality: Right;   TUBAL LIGATION     VAGINAL HYSTERECTOMY     Patient Active Problem List   Diagnosis Date Noted   Status post left hip replacement 09/23/2023   Obesity, morbid (HCC) 06/18/2023   Prediabetes 12/11/2022   Mixed hyperlipidemia 12/11/2022   Essential hypertension, benign 12/11/2022   Vitamin D  deficiency, unspecified 12/11/2022   B12 deficiency due to diet 12/11/2022   History of colonic polyps 07/22/2022   Dyspepsia 05/02/2022   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   Screening for colon cancer 05/02/2022   Polyp of colon 05/02/2022   MDD (major depressive disorder), recurrent severe, without psychosis (HCC) 01/28/2017   S/P total knee replacement 10/11/2015   Osteoarthritis of knee 09/27/2013   Patellofemoral dysfunction 09/27/2013   Hypothyroidism (acquired) 09/14/2007   Allergic rhinitis 03/04/2006    PCP: Trenda Frisk, FNP   REFERRING PROVIDER: [name], MD  REFERRING DIAG: S/P L ant total hip replacement PT SHOULD START 09/26/23  THERAPY DIAG:  Pain in left hip  Muscle weakness (generalized)  Other abnormalities of gait and mobility  Rationale for Evaluation and Treatment: Rehabilitation  ONSET DATE: 09/23/23  SUBJECTIVE:   SUBJECTIVE STATEMENT: Minimal L hip pain, just a sensation of pressure/tightness   PERTINENT HISTORY: Candice Watkins is a 53 y.o. 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 The patient has a history of pain and functional disability in the left hip due to arthritis and has failed non-surgical conservative treatments for greater than 12 weeks to include NSAID's and/or analgesics, corticosteriod injections, supervised PT with  diminished ADL's post treatment, use of assistive devices, weight reduction as appropriate, and activity modification.  Onset of symptoms was gradual, starting 1 year ago with rapidlly worsening course since that time. The patient noted no past surgery on the left hip.  Patient currently rates pain at 10 out of 10 with activity. Patient has night pain, worsening of pain with activity and weight bearing, and pain that interferes with activities of daily living.  Patient has evidence of subchondral cysts, subchondral sclerosis, periarticular osteophytes, and joint space narrowing by imaging studies.  There is no active infection.  PAIN:  Are you having pain? Yes: NPRS scale: 6/10 Pain location: L hip Pain description: sore Aggravating factors: activity Relieving factors: ice, meds, rest  PRECAUTIONS: Anterior hip  RED FLAGS: None   WEIGHT BEARING RESTRICTIONS: No  FALLS:  Has patient fallen in last 6 months? No  LIVING ENVIRONMENT: Lives with: lives with their family Lives in: House/apartment Stairs: 2  Has following equipment at home: Environmental consultant - 2 wheeled  OCCUPATION: not working  PLOF: Independent  PATIENT GOALS: To regain function in my hip  NEXT MD VISIT: 10/08/23  OBJECTIVE:  Note: Objective measures were completed at Evaluation unless otherwise noted.  DIAGNOSTIC FINDINGS: n/a  PATIENT SURVEYS:  LEFS 14/80  MUSCLE LENGTH: Not tested  POSTURE: No Significant postural limitations  PALPATION: Deferred post-op  LOWER EXTREMITY ROM:  A/PROM Right eval Left eval  Hip flexion  70/90d  Hip extension    Hip abduction    Hip adduction    Hip internal rotation    Hip external rotation    Knee flexion    Knee extension    Ankle dorsiflexion    Ankle plantarflexion    Ankle inversion    Ankle eversion     (Blank rows = not tested)  LOWER EXTREMITY MMT:  MMT Right eval Left eval  Hip flexion  3  Hip extension    Hip abduction  3  Hip adduction    Hip  internal rotation    Hip external rotation    Knee flexion    Knee extension    Ankle dorsiflexion    Ankle plantarflexion    Ankle inversion    Ankle eversion     (Blank rows = not tested)  LOWER EXTREMITY SPECIAL TESTS:  Deferred post-op  FUNCTIONAL TESTS:  30 seconds chair stand test 4 arms crossed  GAIT: Distance walked: 84ftx2 Assistive device utilized: Environmental consultant - 2 wheeled Level of assistance: Complete Independence Comments: slow cadence step through pattern  09/26/23 2 MWT 190 ft with RW  TREATMENT DATE:  [facility] Adult PT Treatment:                                                DATE: 10/17/23 Therapeutic Exercise: Nustep L5 8 min  Neuromuscular re-ed: STS from airex pad 10x Runners step 4 in 10/10 Sidestep at counter, 2 trips against yellow power band Therapeutic Activity: Step ups L 4 in 10x Lateral steps L 4 in 10x Heel raises 4 in 15x  [facility] Adult PT Treatment:                                                DATE: 10/07/23 Therapeutic Exercise: Seated hamstring stretch 30s x2 Nustep L4 8 min Neuromuscular re-ed: Supine march 15/15 Supine hip fallouts BluTB 10x B, 10/10 unilaterally R S/L clams BluTB 10x Bridge against BluTB 10x Bridge w/ball 15x Therapeutic Activity: FAQs with adduction 15x Stair negotiation 4 steps with cane and rail step to pattern  [facility] Adult PT Treatment:                                                DATE: 10/02/23 Therapeutic Exercise: Nustep L3 8 min L seated hamstring stretch 30s x2 Neuromuscular re-ed: FAQs with adduction 15x2 Supine hip fallouts GTB 15x B, 15/15 unilaterally R S/L clams GTB 15x Bridge against GTB 15x Bridge w/ball 15x Tandem stepping and WS 4 in step 10/10 weaning UE support. Therapeutic Activity: Heel slides with strap 15x QS's 3x 15x SAQ 3# 15x   [facility] Adult PT Treatment:                                                 DATE: 09/30/23 Therapeutic Exercise: Nustep L2 8 min  Neuromuscular re-ed: FAQs with adduction 15x Supine hip fallouts GTB 10x B, 10/10 unilaterally R S/L clams GTB 10x Bridge against GTB 10x Bridge w/ball 10x Tandem stepping and WS 4 in step 10/10 weaning UE support. Therapeutic Activity: Heel slides with strap 10x2 QS's 3x 10x SLR L (unable) SAQ 2# 10x   [facility] Adult PT Treatment:                                                DATE: 09/26/23 Eval and HEP Self Care: Additional minutes spent for educating on updated Therapeutic Home Exercise Program as well as comparing current status to condition at start of symptoms. This included exercises focusing on stretching, strengthening, with focus on eccentric aspects. Long term goals include an improvement in range of motion, strength, endurance as well as avoiding reinjury. Patient's frequency would include in 1-2 times a day, 3-5 times a week for a duration of 6-12 weeks. Proper technique shown and discussed handout in great detail. All questions were discussed and addressed.     PATIENT EDUCATION:  Education details: Discussed eval findings,  rehab rationale and POC and patient is in agreement  Person educated: Patient Education method: Explanation Education comprehension: verbalized understanding and needs further education  HOME EXERCISE PROGRAM: Access Code: [email or web address] URL: [email or web address] Date: 10/17/2023 Prepared by: Gretta Leavens  Exercises - Supine Heel Slide with Strap  - 2-3 x daily - 5 x weekly - 1 sets - 10 reps - Supine Quad Set  - 2-3 x daily - 5 x weekly - 1 sets - 10 reps - 3s hold - Standing Heel Raise with Support  - 2-3 x daily - 5 x weekly - 1 sets - 10 reps - Bent Knee Fallouts  - 2-3 x daily - 5 x weekly - 1 sets - 10 reps - Modified Thomas Stretch  - 2-3 x daily - 5 x weekly - 1 sets - 2 reps - 30s hold  ASSESSMENT:    CLINICAL IMPRESSION: Advanced to  more challenging tasks including runners step and heel raises to address LLE strength and proprioceptive deficits.  Initially apprehensive about leading with LLE but confidence increased with repetition.  Focus on lateral hip strength.   Patient is a 53 y.o. female who was seen today for physical therapy evaluation and treatment s/p THA. Patient presents with good gait pattern, minimal pain levels and compliance with HEP.  ROM and strength deficits identified.  2 MWT established for baseline.  30s chair stand test tolerated w/o UE support needed.  LEFS identifies functional deficits.  OBJECTIVE IMPAIRMENTS: Abnormal gait, decreased activity tolerance, decreased balance, decreased endurance, decreased knowledge of use of DME, difficulty walking, decreased ROM, decreased strength, improper body mechanics, obesity, and pain.   ACTIVITY LIMITATIONS: carrying, lifting, standing, squatting, stairs, transfers, bed mobility, and bathing  PERSONAL FACTORS: Fitness, Past/current experiences, and 1 comorbidity: DM are also affecting patient's functional outcome.   REHAB POTENTIAL: Good  CLINICAL DECISION MAKING: Stable/uncomplicated  EVALUATION COMPLEXITY: Low   GOALS: Goals reviewed with patient? No  SHORT TERM GOALS: Target date: 10/16/2023   Patient to demonstrate independence in HEP  Baseline: [id number] Goal status: INITIAL  2.  Increase distance on 2 MWT with LRAD Baseline: 127ft w/RW Goal status: INITIAL    LONG TERM GOALS: Target date: 11/06/2023  Patient will increase 30s chair stand reps from 4 to 7 without arms to demonstrate and improved functional ability with less pain/difficulty as well as reduce fall risk.  Baseline: 4 Goal status: INITIAL  2.  Patient will acknowledge 4/10 pain at least once during episode of care   Baseline: 6/10 Goal status: INITIAL  3.  Patient will score at least 40/80 on LEFS to signify clinically meaningful improvement in functional abilities.    Baseline: 14/80 Goal status: INITIAL  4.  Patient to negotiate 16 steps with most appropriate pattern. Baseline: TBD Goal status: INITIAL  5.  110d AROM L hip flexion Baseline: 70d Goal status: INITIAL  6.  4/5 L hip strength  Baseline: 3/5 Goal status: INITIAL   PLAN:  PT FREQUENCY: 2x/week  PT DURATION: 6 weeks  PLANNED INTERVENTIONS: 97164- PT Re-evaluation, 97110-Therapeutic exercises, 97530- Therapeutic activity, 97112- Neuromuscular re-education, 97535- Self Care, 11914- Manual therapy, [phone]- Gait training, Patient/Family education, Balance training, and Stair training  PLAN FOR NEXT SESSION: HEP review and update, manual techniques as appropriate, aerobic tasks, ROM and flexibility activities, strengthening and PREs, TPDN, gait and balance training as needed     Eldon Greenland, PT 10/17/2023, 1:03 PM

## 2023-10-16 ENCOUNTER — Ambulatory Visit: Payer: Medicare HMO | Admitting: Family

## 2023-10-17 ENCOUNTER — Ambulatory Visit

## 2023-10-17 DIAGNOSIS — R2689 Other abnormalities of gait and mobility: Secondary | ICD-10-CM

## 2023-10-17 DIAGNOSIS — M25552 Pain in left hip: Secondary | ICD-10-CM

## 2023-10-17 DIAGNOSIS — M6281 Muscle weakness (generalized): Secondary | ICD-10-CM

## 2023-10-22 ENCOUNTER — Ambulatory Visit: Attending: Orthopedic Surgery | Admitting: Physical Therapy

## 2023-10-22 ENCOUNTER — Encounter: Payer: Self-pay | Admitting: Physical Therapy

## 2023-10-22 DIAGNOSIS — M25552 Pain in left hip: Secondary | ICD-10-CM | POA: Diagnosis not present

## 2023-10-22 DIAGNOSIS — M6281 Muscle weakness (generalized): Secondary | ICD-10-CM | POA: Insufficient documentation

## 2023-10-22 DIAGNOSIS — R2689 Other abnormalities of gait and mobility: Secondary | ICD-10-CM | POA: Diagnosis not present

## 2023-10-22 NOTE — Therapy (Addendum)
 PHYSICAL THERAPY UNPLANNED DISCHARGE SUMMARY   Visits from Start of Care: 6  Current functional level related to goals / functional outcomes: Current status unknown   Remaining deficits: Current status unknown   Education / Equipment: Pt has not returned since visit listed below  Patient goals were not assessed. Patient is being discharged due to not returning since the last visit.  (the note below was addended to include the above D/C summary on 05/04/2024)   Patient Name: Candice Watkins MRN: [id number] DOB:1971-05-12, 53 y.o., female Today's Date: 10/22/2023  END OF SESSION:  PT End of Session - 10/22/23 1325     Visit Number 6    Number of Visits 12    Date for PT Re-Evaluation 11/26/23    Authorization Type Aet[employer] MCR    PT Start Time 0130    PT Stop Time 0215    PT Time Calculation (min) 45 min    Activity Tolerance Patient tolerated treatment well    Behavior During Therapy [facility] for tasks assessed/performed                 Past Medical History:  Diagnosis Date   Anxiety    Arthritis    knees, ankles (10/11/2015)   Asthma    Depression    Diabetes mellitus without complication (HCC)    Heartburn    HLD (hyperlipidemia)    Hypertension    Hypothyroidism    Kidney stones    Migraine    monthly (10/11/2015)   PONV (postoperative nausea and vomiting) X 1   Pre-diabetes    Primary hyperparathyroidism (HCC)    Seasonal allergies    Sleep apnea    Stomach ulcer    Past Surgical History:  Procedure Laterality Date   BREAST BIOPSY Bilateral    neg- core   CESAREAN SECTION  2000   COLONOSCOPY WITH PROPOFOL  N/A 05/02/2022   Procedure: COLONOSCOPY WITH PROPOFOL ;  Surgeon: Unk Corinn Skiff, MD;  Location: [facility] ENDOSCOPY;  Service: Gastroenterology;  Laterality: N/A;   COLONOSCOPY WITH PROPOFOL  N/A 07/22/2022   Procedure: COLONOSCOPY WITH PROPOFOL ;  Surgeon: Unk Corinn Skiff, MD;  Location: [facility] ENDOSCOPY;  Service: Gastroenterology;   Laterality: N/A;   ESOPHAGOGASTRODUODENOSCOPY (EGD) WITH PROPOFOL  N/A 05/02/2022   Procedure: ESOPHAGOGASTRODUODENOSCOPY (EGD) WITH PROPOFOL ;  Surgeon: Unk Corinn Skiff, MD;  Location: [facility] ENDOSCOPY;  Service: Gastroenterology;  Laterality: N/A;   JOINT REPLACEMENT     KNEE ARTHROSCOPY Right    THYROID  SURGERY     removed tumor   TOTAL HIP ARTHROPLASTY Left 09/23/2023   Procedure: ARTHROPLASTY, HIP, TOTAL, ANTERIOR APPROACH;  Surgeon: Beverley Evalene BIRCH, MD;  Location: [facility] ORS;  Service: Orthopedics;  Laterality: Left;   TOTAL KNEE ARTHROPLASTY Right 10/11/2015   TOTAL KNEE ARTHROPLASTY Right 10/11/2015   Procedure: TOTAL KNEE ARTHROPLASTY;  Surgeon: Toribio JULIANNA Beverley, MD;  Location: [facility] OR;  Service: Orthopedics;  Laterality: Right;   TUBAL LIGATION     VAGINAL HYSTERECTOMY     Patient Active Problem List   Diagnosis Date Noted   Status post left hip replacement 09/23/2023   Obesity, morbid (HCC) 06/18/2023   Prediabetes 12/11/2022   Mixed hyperlipidemia 12/11/2022   Essential hypertension, benign 12/11/2022   Vitamin D  deficiency, unspecified 12/11/2022   B12 deficiency due to diet 12/11/2022   History of colonic polyps 07/22/2022   Dyspepsia 05/02/2022   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   Screening for colon cancer 05/02/2022   Polyp of colon 05/02/2022   MDD (major depressive disorder), recurrent severe,  without psychosis (HCC) 01/28/2017   S/P total knee replacement 10/11/2015   Osteoarthritis of knee 09/27/2013   Patellofemoral dysfunction 09/27/2013   Hypothyroidism (acquired) 09/14/2007   Allergic rhinitis 03/04/2006    PCP: Orlean Alan HERO, FNP   REFERRING PROVIDER: Beverley Evalene BIRCH, MD  REFERRING DIAG: S/P L ant total hip replacement PT SHOULD START 09/26/23  THERAPY DIAG:  Pain in left hip  Muscle weakness (generalized)  Other abnormalities of gait and mobility  Rationale for Evaluation and Treatment: Rehabilitation  ONSET DATE: 09/23/23  SUBJECTIVE:    SUBJECTIVE STATEMENT: Minimal L hip pain, just a sensation of pressure/tightness   PERTINENT HISTORY: Candice Watkins is a 53 y.o. 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 The patient has a history of pain and functional disability in the left hip due to arthritis and has failed non-surgical conservative treatments for greater than 12 weeks to include NSAID's and/or analgesics, corticosteriod injections, supervised PT with diminished ADL's post treatment, use of assistive devices, weight reduction as appropriate, and activity modification.  Onset of symptoms was gradual, starting 1 year ago with rapidlly worsening course since that time. The patient noted no past surgery on the left hip.  Patient currently rates pain at 10 out of 10 with activity. Patient has night pain, worsening of pain with activity and weight bearing, and pain that interferes with activities of daily living.  Patient has evidence of subchondral cysts, subchondral sclerosis, periarticular osteophytes, and joint space narrowing by imaging studies.  There is no active infection.  PAIN:  Are you having pain? Yes: NPRS scale: 6/10 Pain location: L hip Pain description: sore Aggravating factors: activity Relieving factors: ice, meds, rest  PRECAUTIONS: Anterior hip  RED FLAGS: None   WEIGHT BEARING RESTRICTIONS: No  FALLS:  Has patient fallen in last 6 months? No  LIVING ENVIRONMENT: Lives with: lives with their family Lives in: House/apartment Stairs: 2  Has following equipment at home: Environmental Consultant - 2 wheeled  OCCUPATION: not working  PLOF: Independent  PATIENT GOALS: To regain function in my hip  NEXT MD VISIT: 10/08/23  OBJECTIVE:  Note: Objective measures were completed at Evaluation unless otherwise noted.  DIAGNOSTIC FINDINGS: n/a  PATIENT SURVEYS:  LEFS 14/80  MUSCLE LENGTH: Not tested  POSTURE: No Significant postural limitations  PALPATION: Deferred post-op  LOWER EXTREMITY  ROM:  A/PROM Right eval Left eval  Hip flexion  70/90d  Hip extension    Hip abduction    Hip adduction    Hip internal rotation    Hip external rotation    Knee flexion    Knee extension    Ankle dorsiflexion    Ankle plantarflexion    Ankle inversion    Ankle eversion     (Blank rows = not tested)  LOWER EXTREMITY MMT:  MMT Right eval Left eval L 6/4  Hip flexion  3 3+  Hip extension     Hip abduction  3 3  Hip adduction     Hip internal rotation     Hip external rotation     Knee flexion     Knee extension     Ankle dorsiflexion     Ankle plantarflexion     Ankle inversion     Ankle eversion      (Blank rows = not tested)  LOWER EXTREMITY SPECIAL TESTS:  Deferred post-op  FUNCTIONAL TESTS:  30 seconds chair stand test 4 arms crossed  GAIT: Distance walked: 105ftx2 Assistive device utilized: Environmental Consultant -  2 wheeled Level of assistance: Complete Independence Comments: slow cadence step through pattern  09/26/23 2 MWT 190 ft with RW                                                                                                                        TREATMENT DATE:  [facility] Adult PT Treatment:                                                DATE: 10/22/23 Therapeutic Exercise: Nustep L5 5 min Therapeutic Activity: Step ups L 4 in 10x Runners step 4 in 10/10 Sidestep at counter, 2 trips against red power band Lateral steps L 4 in 10x ea Heel raises 4 in 2x15 Checking goals and reviewing with pt  HOME EXERCISE PROGRAM: Access Code: [email or web address] URL: [email or web address] Date: 10/17/2023 Prepared by: Reyes Kohut  Exercises - Supine Heel Slide with Strap  - 2-3 x daily - 5 x weekly - 1 sets - 10 reps - Supine Quad Set  - 2-3 x daily - 5 x weekly - 1 sets - 10 reps - 3s hold - Standing Heel Raise with Support  - 2-3 x daily - 5 x weekly - 1 sets - 10 reps - Bent Knee Fallouts  - 2-3 x daily - 5 x weekly - 1 sets - 10 reps - Modified Thomas  Stretch  - 2-3 x daily - 5 x weekly - 1 sets - 2 reps - 30s hold  ASSESSMENT:    CLINICAL IMPRESSION:   Upon goal recheck pt has met all goals with exception of hip strength and LEFS score.  We will continue for a few more visits with focus on hip abd strength and balance.   Patient is a 53 y.o. female who was seen today for physical therapy evaluation and treatment s/p THA. Patient presents with good gait pattern, minimal pain levels and compliance with HEP.  ROM and strength deficits identified.  2 MWT established for baseline.  30s chair stand test tolerated w/o UE support needed.  LEFS identifies functional deficits.  OBJECTIVE IMPAIRMENTS: Abnormal gait, decreased activity tolerance, decreased balance, decreased endurance, decreased knowledge of use of DME, difficulty walking, decreased ROM, decreased strength, improper body mechanics, obesity, and pain.   ACTIVITY LIMITATIONS: carrying, lifting, standing, squatting, stairs, transfers, bed mobility, and bathing  PERSONAL FACTORS: Fitness, Past/current experiences, and 1 comorbidity: DM are also affecting patient's functional outcome.   REHAB POTENTIAL: Good  CLINICAL DECISION MAKING: Stable/uncomplicated  EVALUATION COMPLEXITY: Low   GOALS: Goals reviewed with patient? No  SHORT TERM GOALS: Target date: 10/16/2023   Patient to demonstrate independence in HEP  Baseline: [id number] Goal status: MET  2.  Increase distance on 2 MWT with LRAD Baseline: 159ft w/RW 6/4: 321 w/ SPC Goal status: MET    LONG TERM GOALS: Target date: 11/06/2023  Patient will increase 30s chair stand reps from 4 to 7 without arms to demonstrate and improved functional ability with less pain/difficulty as well as reduce fall risk.  Baseline: 4 6/4: 7 Goal status: MET  2.  Patient will acknowledge 4/10 pain at least once during episode of care   Baseline: 6/10 6/4: 2/10 Goal status: MET  3.  Patient will score at least 40/80 on LEFS to signify  clinically meaningful improvement in functional abilities.   Baseline: 14/80 6/4: 35/80 Goal status: Partially MET  4.  Patient to negotiate 16 steps with most appropriate pattern. Baseline: TBD 6/4: MET Goal status: MET  5.  110d AROM L hip flexion Baseline: 70d 6/4: 110d Goal status: MET  6.  4/5 L hip strength  Baseline: 3/5 6/4: Hip flexion 3+/5, hip abd 3/5 Goal status: Partially MET   PLAN:  PT FREQUENCY: 2x/week  PT DURATION: 6 weeks  PLANNED INTERVENTIONS: 97164- PT Re-evaluation, 97110-Therapeutic exercises, 97530- Therapeutic activity, 97112- Neuromuscular re-education, 97535- Self Care, [id number]- Manual therapy, [phone]- Gait training, Patient/Family education, Balance training, and Stair training  PLAN FOR NEXT SESSION: HEP review and update, manual techniques as appropriate, aerobic tasks, ROM and flexibility activities, strengthening and PREs, TPDN, gait and balance training as needed     Helene FORBES Gasmen, PT 10/22/2023, 2:17 PM

## 2023-10-29 DIAGNOSIS — I10 Essential (primary) hypertension: Secondary | ICD-10-CM | POA: Diagnosis not present

## 2023-10-29 DIAGNOSIS — N1831 Chronic kidney disease, stage 3a: Secondary | ICD-10-CM | POA: Diagnosis not present

## 2023-10-29 DIAGNOSIS — E785 Hyperlipidemia, unspecified: Secondary | ICD-10-CM | POA: Diagnosis not present

## 2023-11-03 ENCOUNTER — Ambulatory Visit

## 2023-11-04 ENCOUNTER — Ambulatory Visit: Admitting: Family

## 2023-11-05 DIAGNOSIS — M76822 Posterior tibial tendinitis, left leg: Secondary | ICD-10-CM | POA: Diagnosis not present

## 2023-11-06 ENCOUNTER — Encounter

## 2023-11-10 ENCOUNTER — Encounter: Payer: Self-pay | Admitting: Family

## 2023-11-10 ENCOUNTER — Ambulatory Visit (INDEPENDENT_AMBULATORY_CARE_PROVIDER_SITE_OTHER): Admitting: Family

## 2023-11-10 ENCOUNTER — Encounter

## 2023-11-10 DIAGNOSIS — N1831 Chronic kidney disease, stage 3a: Secondary | ICD-10-CM | POA: Diagnosis not present

## 2023-11-10 DIAGNOSIS — Z0130 Encounter for examination of blood pressure without abnormal findings: Secondary | ICD-10-CM

## 2023-11-10 DIAGNOSIS — R7303 Prediabetes: Secondary | ICD-10-CM | POA: Diagnosis not present

## 2023-11-12 ENCOUNTER — Encounter: Admitting: Physical Therapy

## 2023-11-17 ENCOUNTER — Encounter

## 2023-11-18 DIAGNOSIS — F332 Major depressive disorder, recurrent severe without psychotic features: Secondary | ICD-10-CM | POA: Diagnosis not present

## 2023-11-23 ENCOUNTER — Other Ambulatory Visit: Payer: Self-pay | Admitting: Family

## 2023-12-05 ENCOUNTER — Other Ambulatory Visit: Payer: Self-pay | Admitting: Family

## 2023-12-05 DIAGNOSIS — F332 Major depressive disorder, recurrent severe without psychotic features: Secondary | ICD-10-CM | POA: Diagnosis not present

## 2023-12-06 ENCOUNTER — Other Ambulatory Visit: Payer: Self-pay | Admitting: Family

## 2023-12-09 ENCOUNTER — Other Ambulatory Visit: Payer: Self-pay | Admitting: Family

## 2023-12-16 ENCOUNTER — Other Ambulatory Visit: Payer: Self-pay | Admitting: Family

## 2023-12-16 DIAGNOSIS — R7303 Prediabetes: Secondary | ICD-10-CM

## 2023-12-25 DIAGNOSIS — F332 Major depressive disorder, recurrent severe without psychotic features: Secondary | ICD-10-CM | POA: Diagnosis not present

## 2023-12-26 ENCOUNTER — Other Ambulatory Visit: Payer: Self-pay | Admitting: Family

## 2023-12-29 ENCOUNTER — Encounter: Payer: Self-pay | Admitting: Family

## 2023-12-29 DIAGNOSIS — M7062 Trochanteric bursitis, left hip: Secondary | ICD-10-CM | POA: Diagnosis not present

## 2023-12-29 NOTE — Assessment & Plan Note (Signed)
 A1C Continues to be in prediabetic ranges.  Will reassess at follow up after next lab check.  Patient counseled on dietary choices and verbalized understanding.

## 2023-12-29 NOTE — Assessment & Plan Note (Signed)
Patient is seen by nephrology, who manage this condition.  She is well controlled with current therapy.   Will defer to them for further changes to plan of care.

## 2023-12-29 NOTE — Progress Notes (Signed)
 Established Patient Office Visit  Subjective:  Patient ID: Candice Watkins, female    DOB: 1970/07/29  Age: 53 y.o. MRN: [id number]  Chief Complaint  Patient presents with   Follow-up    4 month follow up    Patient is here today for her 3 months follow up.  She has been feeling fairly well since last appointment.   She does not have additional concerns to discuss today.  Labs are not due today.  She needs refills.   I have reviewed her active problem list, medication list, allergies, health maintenance, notes from last encounter, lab results for her appointment today.      No other concerns at this time.   Past Medical History:  Diagnosis Date   Anxiety    Arthritis    knees, ankles (10/11/2015)   Asthma    Depression    Diabetes mellitus without complication (HCC)    Heartburn    HLD (hyperlipidemia)    Hypertension    Hypothyroidism    Kidney stones    Migraine    monthly (10/11/2015)   PONV (postoperative nausea and vomiting) X 1   Pre-diabetes    Primary hyperparathyroidism (HCC)    Seasonal allergies    Sleep apnea    Stomach ulcer     Past Surgical History:  Procedure Laterality Date   BREAST BIOPSY Bilateral    neg- core   CESAREAN SECTION  2000   COLONOSCOPY WITH PROPOFOL  N/A 05/02/2022   Procedure: COLONOSCOPY WITH PROPOFOL ;  Surgeon: Unk Corinn Skiff, MD;  Location: [facility] ENDOSCOPY;  Service: Gastroenterology;  Laterality: N/A;   COLONOSCOPY WITH PROPOFOL  N/A 07/22/2022   Procedure: COLONOSCOPY WITH PROPOFOL ;  Surgeon: Unk Corinn Skiff, MD;  Location: [facility] ENDOSCOPY;  Service: Gastroenterology;  Laterality: N/A;   ESOPHAGOGASTRODUODENOSCOPY (EGD) WITH PROPOFOL  N/A 05/02/2022   Procedure: ESOPHAGOGASTRODUODENOSCOPY (EGD) WITH PROPOFOL ;  Surgeon: Unk Corinn Skiff, MD;  Location: [facility] ENDOSCOPY;  Service: Gastroenterology;  Laterality: N/A;   JOINT REPLACEMENT     KNEE ARTHROSCOPY Right    THYROID  SURGERY     removed tumor    TOTAL HIP ARTHROPLASTY Left 09/23/2023   Procedure: ARTHROPLASTY, HIP, TOTAL, ANTERIOR APPROACH;  Surgeon: Beverley Evalene BIRCH, MD;  Location: [facility] ORS;  Service: Orthopedics;  Laterality: Left;   TOTAL KNEE ARTHROPLASTY Right 10/11/2015   TOTAL KNEE ARTHROPLASTY Right 10/11/2015   Procedure: TOTAL KNEE ARTHROPLASTY;  Surgeon: Toribio JULIANNA Beverley, MD;  Location: [facility] OR;  Service: Orthopedics;  Laterality: Right;   TUBAL LIGATION     VAGINAL HYSTERECTOMY      Social History   Socioeconomic History   Marital status: Single    Spouse name: Not on file   Number of children: Not on file   Years of education: Not on file   Highest education level: Not on file  Occupational History   Not on file  Tobacco Use   Smoking status: Never   Smokeless tobacco: Never  Vaping Use   Vaping status: Never Used  Substance and Sexual Activity   Alcohol use: Not Currently    Comment: occ   Drug use: No   Sexual activity: Yes    Birth control/protection: None    Comment: hysterectomy  Other Topics Concern   Not on file  Social History Narrative   Not on file   Social Drivers of Health   Financial Resource Strain: Not on file  Food Insecurity: No Food Insecurity (09/23/2023)   Hunger Vital Sign    Worried  About Running Out of Food in the Last Year: Never true    Ran Out of Food in the Last Year: Never true  Transportation Needs: No Transportation Needs (09/23/2023)   PRAPARE - Administrator, Civil Service (Medical): No    Lack of Transportation (Non-Medical): No  Physical Activity: Not on file  Stress: Not on file  Social Connections: Not on file  Intimate Partner Violence: Not At Risk (09/23/2023)   Humiliation, Afraid, Rape, and Kick questionnaire    Fear of Current or Ex-Partner: No    Emotionally Abused: No    Physically Abused: No    Sexually Abused: No    Family History  Problem Relation Age of Onset   Breast cancer Neg Hx    Kidney disease Neg Hx     Allergies  Allergen  Reactions   Ibuprofen  Other (See Comments)    History of Stomach Ulcers   Metronidazole Swelling and Other (See Comments)    EDEMA   Phenyltoloxamine-Acetaminophen  Hives   Other Nausea And Vomiting    Cogesic   Septra [Sulfamethoxazole-Trimethoprim] Nausea And Vomiting    Review of Systems  All other systems reviewed and are negative.      Objective:   BP 118/86   Pulse 84   Ht 5' 4 (1.626 m)   Wt 227 lb (103 kg)   SpO2 98%   BMI 38.96 kg/m   Vitals:   11/10/23 1439  BP: 118/86  Pulse: 84  Height: 5' 4 (1.626 m)  Weight: 227 lb (103 kg)  SpO2: 98%  BMI (Calculated): 38.95    Physical Exam Vitals and nursing note reviewed.  Constitutional:      Appearance: Normal appearance. She is normal weight.  HENT:     Head: Normocephalic.  Eyes:     Extraocular Movements: Extraocular movements intact.     Conjunctiva/sclera: Conjunctivae normal.     Pupils: Pupils are equal, round, and reactive to light.  Cardiovascular:     Rate and Rhythm: Normal rate.  Pulmonary:     Effort: Pulmonary effort is normal.  Neurological:     General: No focal deficit present.     Mental Status: She is alert and oriented to person, place, and time. Mental status is at baseline.  Psychiatric:        Mood and Affect: Mood normal.        Behavior: Behavior normal.        Thought Content: Thought content normal.        Judgment: Judgment normal.      No results found for any visits on 11/10/23.  No results found for this or any previous visit (from the past 2160 hours).     Assessment & Plan Obesity, morbid (HCC) Continue current meds.  Will adjust as needed based on results.  The patient is asked to make an attempt to improve diet and exercise patterns to aid in medical management of this problem. Addressed importance of increasing and maintaining water intake.   Stage 3a chronic kidney disease (HCC) Patient is seen by nephrology, who manage this condition.  She is well  controlled with current therapy.   Will defer to them for further changes to plan of care.  Prediabetes A1C Continues to be in prediabetic ranges.  Will reassess at follow up after next lab check.  Patient counseled on dietary choices and verbalized understanding.      Return in about 4 months (around 03/11/2024).   Total time  spent: 20 minutes  ALAN CHRISTELLA ARRANT, FNP  11/10/2023   This document may have been prepared by [facility] Voice Recognition software and as such may include unintentional dictation errors.

## 2023-12-29 NOTE — Assessment & Plan Note (Signed)
 Continue current meds.  Will adjust as needed based on results.  The patient is asked to make an attempt to improve diet and exercise patterns to aid in medical management of this problem. Addressed importance of increasing and maintaining water  intake.

## 2024-01-07 DIAGNOSIS — M7062 Trochanteric bursitis, left hip: Secondary | ICD-10-CM | POA: Diagnosis not present

## 2024-01-14 ENCOUNTER — Other Ambulatory Visit: Payer: Self-pay | Admitting: Family

## 2024-01-14 DIAGNOSIS — Z1231 Encounter for screening mammogram for malignant neoplasm of breast: Secondary | ICD-10-CM

## 2024-01-29 ENCOUNTER — Other Ambulatory Visit: Payer: Self-pay | Admitting: Family

## 2024-02-02 DIAGNOSIS — M1712 Unilateral primary osteoarthritis, left knee: Secondary | ICD-10-CM | POA: Diagnosis not present

## 2024-02-04 ENCOUNTER — Other Ambulatory Visit: Payer: Self-pay | Admitting: Family

## 2024-02-04 DIAGNOSIS — E785 Hyperlipidemia, unspecified: Secondary | ICD-10-CM | POA: Diagnosis not present

## 2024-02-04 DIAGNOSIS — N1831 Chronic kidney disease, stage 3a: Secondary | ICD-10-CM | POA: Diagnosis not present

## 2024-02-04 DIAGNOSIS — I10 Essential (primary) hypertension: Secondary | ICD-10-CM | POA: Diagnosis not present

## 2024-02-05 DIAGNOSIS — F332 Major depressive disorder, recurrent severe without psychotic features: Secondary | ICD-10-CM | POA: Diagnosis not present

## 2024-02-23 DIAGNOSIS — M1712 Unilateral primary osteoarthritis, left knee: Secondary | ICD-10-CM | POA: Diagnosis not present

## 2024-03-01 ENCOUNTER — Ambulatory Visit

## 2024-03-02 DIAGNOSIS — R262 Difficulty in walking, not elsewhere classified: Secondary | ICD-10-CM | POA: Diagnosis not present

## 2024-03-02 DIAGNOSIS — M1712 Unilateral primary osteoarthritis, left knee: Secondary | ICD-10-CM | POA: Diagnosis not present

## 2024-03-02 NOTE — Progress Notes (Signed)
 COVID Vaccine received:  []  No [x]  Yes Date of any COVID positive Test in last 90 days:  none  PCP - Alan Arrant, FNP  [phone]  [phone] (Fax)  Cardiologist - none Nephrology- Woodward Brought, MD  LOV 02-04-24   Chest x-ray - 01-26-2017  2v  Epic EKG -  09-10-23    Stress Test -  ECHO -  Cardiac Cath -  CT Coronary Calcium  score:   Pacemaker / ICD device [x]  No []  Yes   Spinal Cord Stimulator:[x]  No []  Yes       History of Sleep Apnea? []  No [x]  Yes   CPAP used?- [x]  No []  Yes    Patient has: []  NO Hx DM   [x]  Pre-DM   []  DM1  []   DM2 Does the patient monitor blood sugar?   []  N/A   [x]  No []  Yes  Last A1c was:  4.9 on   09-10-23   MOUNJARO - Injects on Fridays Last dose: 02-27-24  patient is aware  Blood Thinner / Instructions:  None Aspirin  Instructions:  none  Dental hx: []  Dentures:  []  N/A      [x]  Bridge or Partial: on top                  []  Loose or Damaged teeth:   Activity level: Able to walk up 2 flights of stairs without becoming significantly short of breath or having chest pain?  []  No   [x]    Yes  Patient can perform ADLs without assistance. []  No   [x]   Yes  Anesthesia review: OSA- no CPAP, CKD3a, HTN, Pre-DM, MDD, PONV   Patient denies any S&S of respiratory illness or Covid - no shortness of breath, fever, cough or chest pain at PAT appointment.  Patient verbalized understanding and agreement to the Pre-Surgical Instructions that were given to them at this PAT appointment. Patient was also educated of the need to review these PAT instructions again prior to her surgery.I reviewed the appropriate phone numbers to call if they have any and questions or concerns.

## 2024-03-02 NOTE — Patient Instructions (Signed)
 SURGICAL WAITING ROOM VISITATION Patients having surgery or a procedure may have no more than 2 support people in the waiting area - these visitors may rotate in the visitor waiting room.   If the patient needs to stay at the hospital during part of their recovery, the visitor guidelines for inpatient rooms apply.  PRE-OP VISITATION  Pre-op nurse will coordinate an appropriate time for 1 support person to accompany the patient in pre-op.  This support person may not rotate.  This visitor will be contacted when the time is appropriate for the visitor to come back in the pre-op area.  Please refer to the [facility] website for the visitor guidelines for Inpatients (after your surgery is over and you are in a regular room).  You are not required to quarantine at this time prior to your surgery. However, you must do this: Hand Hygiene often Do NOT share personal items Notify your provider if you are in close contact with someone who has COVID or you develop fever 100.4 or greater, new onset of sneezing, cough, sore throat, shortness of breath or body aches.  If you test positive for Covid or have been in contact with anyone that has tested positive in the last 10 days please notify you surgeon.    Your procedure is scheduled on:  TUESDAY  March 09, 2024  [facility] Main Entrance: [address] entrance where the [employer] is available.   Report to admitting at:   05:15  AM  Call this number if you have any questions or problems the morning of surgery [phone]  Do not eat food after Midnight the night prior to your surgery/procedure.  After Midnight you may have the following liquids until   04:30  AM  DAY OF SURGERY  Clear Liquid Diet Water Black Coffee (sugar ok, NO MILK/CREAM OR CREAMERS)  Tea (sugar ok, NO MILK/CREAM OR CREAMERS) regular and decaf                             Plain Jell-O  with no fruit (NO RED)                                           Fruit  ices (not with fruit pulp, NO RED)                                     Popsicles (NO RED)                                                                  Juice: NO CITRUS JUICES: only apple, WHITE grape, WHITE cranberry Sports drinks like Gatorade or Powerade (NO RED)             FOLLOW ANY ADDITIONAL PRE OP INSTRUCTIONS YOU RECEIVED FROM YOUR SURGEON'S OFFICE!!!   Oral Hygiene is also important to reduce your risk of infection.        Remember - BRUSH YOUR TEETH THE MORNING OF SURGERY WITH YOUR REGULAR TOOTHPASTE  Do NOT  smoke after Midnight the night before surgery.  STOP TAKING all Vitamins, Herbs and supplements 1 week before your surgery.   MOUNJARO - Last injection will be 7-10 days before your surgery. Last injection will be on Friday 02-27-24  Take ONLY these medicines the morning of surgery with A SIP OF WATER: levothyroxine , and Clonazepam  if needed, You may use your Azelastine nasal spray and Symbicort inhaler if needed.    DO NOT TAKE VALSARTAN -HCTZ the morning of your surgery.                    You may not have any metal on your body including hair pins, jewelry, and body piercing  Do not wear make-up, lotions, powders, perfumes or deodorant  Do not wear nail polish including gel and S&S, artificial / acrylic nails, or any other type of covering on natural nails including finger and toenails. If you have artificial nails, gel coating, etc., that needs to be removed by a nail salon, Please have this removed prior to surgery. Not doing so may mean that your surgery could be cancelled or delayed if the Surgeon or anesthesia staff feels like they are unable to monitor you safely.   Do not shave 48 hours prior to surgery to avoid nicks in your skin which may contribute to postoperative infections.    Contacts, Hearing Aids, dentures or bridgework may not be worn into surgery. DENTURES WILL BE REMOVED PRIOR TO SURGERY PLEASE DO NOT APPLY Poly grip OR ADHESIVES!!!  You may  bring a small overnight bag with you on the day of surgery, only pack items that are not valuable. [facility] IS NOT RESPONSIBLE   FOR VALUABLES THAT ARE LOST OR STOLEN.   Do not bring your home medications to the hospital. The Pharmacy will dispense medications listed on your medication list to you during your admission in the Hospital.  Please read over the following fact sheets you were given: IF YOU HAVE QUESTIONS ABOUT YOUR PRE-OP INSTRUCTIONS, PLEASE CALL [phone].      Pre-operative 4 CHG Bath Instructions   You can play a key role in reducing the risk of infection after surgery. Your skin needs to be as free of germs as possible. You can reduce the number of germs on your skin by washing with CHG (chlorhexidine  gluconate) soap before surgery. CHG is an antiseptic soap that kills germs and continues to kill germs even after washing.   DO NOT use if you have an allergy to chlorhexidine /CHG or antibacterial soaps. If your skin becomes reddened or irritated, stop using the CHG and notify one of our RNs at   Please shower with the CHG soap starting 4 days before surgery using the following schedule: FRIDAY  March 05, 2024    Please keep in mind the following:  DO NOT shave, including legs and underarms, starting the day of your first shower.   You may shave your face at any point before/day of surgery.  Place clean sheets on your bed the day you start using CHG soap. Use a clean washcloth (not used since being washed) for each shower. DO NOT sleep with pets once you start using the CHG.  CHG Shower Instructions:  If you choose to wash your hair and private area, wash first with your normal shampoo/soap.  After you use shampoo/soap, rinse your hair and body thoroughly to remove shampoo/soap residue.  Turn the water OFF and apply about 3 tablespoons (45 ml) of CHG soap to a  CLEAN washcloth.  Apply CHG soap ONLY FROM YOUR NECK DOWN TO YOUR TOES (washing for 3-5 minutes)  DO NOT  use CHG soap on face, private areas, open wounds, or sores.  Pay special attention to the area where your surgery is being performed.  If you are having back surgery, having someone wash your back for you may be helpful. Wait 2 minutes after CHG soap is applied, then you may rinse off the CHG soap.  Pat dry with a clean towel  Put on clean clothes/pajamas   If you choose to wear lotion, please use ONLY the CHG-compatible lotions on the back of this paper.     Additional instructions for the day of surgery: DO NOT APPLY any CHG Soap,  lotions, deodorants, cologne, or perfumes on the day of surgery  Put on clean/comfortable clothes.  Brush your teeth.  Ask your nurse before applying any prescription medications to the skin.   CHG Compatible Lotions   Aveeno Moisturizing lotion  Cetaphil Moisturizing Cream  Cetaphil Moisturizing Lotion  Clairol Herbal Essence Moisturizing Lotion, Dry Skin  Clairol Herbal Essence Moisturizing Lotion, Extra Dry Skin  Clairol Herbal Essence Moisturizing Lotion, Normal Skin  Curel Age Defying Therapeutic Moisturizing Lotion with Alpha Hydroxy  Curel Extreme Care Body Lotion  Curel Soothing Hands Moisturizing Hand Lotion  Curel Therapeutic Moisturizing Cream, Fragrance-Free  Curel Therapeutic Moisturizing Lotion, Fragrance-Free  Curel Therapeutic Moisturizing Lotion, Original Formula  Eucerin Daily Replenishing Lotion  Eucerin Dry Skin Therapy Plus Alpha Hydroxy Crme  Eucerin Dry Skin Therapy Plus Alpha Hydroxy Lotion  Eucerin Original Crme  Eucerin Original Lotion  Eucerin Plus Crme Eucerin Plus Lotion  Eucerin TriLipid Replenishing Lotion  Keri Anti-Bacterial Hand Lotion  Keri Deep Conditioning Original Lotion Dry Skin Formula Softly Scented  Keri Deep Conditioning Original Lotion, Fragrance Free Sensitive Skin Formula  Keri Lotion Fast Absorbing Fragrance Free Sensitive Skin Formula  Keri Lotion Fast Absorbing Softly Scented Dry Skin Formula   Keri Original Lotion  Keri Skin Renewal Lotion Keri Silky Smooth Lotion  Keri Silky Smooth Sensitive Skin Lotion  Nivea Body Creamy Conditioning Oil  Nivea Body Extra Enriched Lotion  Nivea Body Original Lotion  Nivea Body Sheer Moisturizing Lotion Nivea Crme  Nivea Skin Firming Lotion  NutraDerm 30 Skin Lotion  NutraDerm Skin Lotion  NutraDerm Therapeutic Skin Cream  NutraDerm Therapeutic Skin Lotion  ProShield Protective Hand Cream  Provon moisturizing lotion   FAILURE TO FOLLOW THESE INSTRUCTIONS MAY RESULT IN THE CANCELLATION OF YOUR SURGERY  PATIENT SIGNATURE_________________________________  NURSE SIGNATURE__________________________________  ________________________________________________________________________        Candice Watkins    An incentive spirometer is a tool that can help keep your lungs clear and active. This tool measures how well you are filling your lungs with each breath. Taking long deep breaths may help reverse or decrease the chance of developing breathing (pulmonary) problems (especially infection) following: A long period of time when you are unable to move or be active. BEFORE THE PROCEDURE  If the spirometer includes an indicator to show your best effort, your nurse or respiratory therapist will set it to a desired goal. If possible, sit up straight or lean slightly forward. Try not to slouch. Hold the incentive spirometer in an upright position. INSTRUCTIONS FOR USE  Sit on the edge of your bed if possible, or sit up as far as you can in bed or on a chair. Hold the incentive spirometer in an upright position. Breathe out normally. Place the mouthpiece in  your mouth and seal your lips tightly around it. Breathe in slowly and as deeply as possible, raising the piston or the ball toward the top of the column. Hold your breath for 3-5 seconds or for as long as possible. Allow the piston or ball to fall to the bottom of the  column. Remove the mouthpiece from your mouth and breathe out normally. Rest for a few seconds and repeat Steps 1 through 7 at least 10 times every 1-2 hours when you are awake. Take your time and take a few normal breaths between deep breaths. The spirometer may include an indicator to show your best effort. Use the indicator as a goal to work toward during each repetition. After each set of 10 deep breaths, practice coughing to be sure your lungs are clear. If you have an incision (the cut made at the time of surgery), support your incision when coughing by placing a pillow or rolled up towels firmly against it. Once you are able to get out of bed, walk around indoors and cough well. You may stop using the incentive spirometer when instructed by your caregiver.  RISKS AND COMPLICATIONS Take your time so you do not get dizzy or light-headed. If you are in pain, you may need to take or ask for pain medication before doing incentive spirometry. It is harder to take a deep breath if you are having pain. AFTER USE Rest and breathe slowly and easily. It can be helpful to keep track of a log of your progress. Your caregiver can provide you with a simple table to help with this. If you are using the spirometer at home, follow these instructions: SEEK MEDICAL CARE IF:  You are having difficultly using the spirometer. You have trouble using the spirometer as often as instructed. Your pain medication is not giving enough relief while using the spirometer. You develop fever of 100.5 F (38.1 C) or higher.                                                                                                    SEEK IMMEDIATE MEDICAL CARE IF:  You cough up bloody sputum that had not been present before. You develop fever of 102 F (38.9 C) or greater. You develop worsening pain at or near the incision site. MAKE SURE YOU:  Understand these instructions. Will watch your condition. Will get help right away if  you are not doing well or get worse. Document Released: 09/16/2006 Document Revised: 07/29/2011 Document Reviewed: 11/17/2006 [facility] Patient Information 2014 [place], [place].          If you would like to see a video about joint replacement:   [email or web address]

## 2024-03-02 NOTE — H&P (Signed)
 KNEE ARTHROPLASTY ADMISSION H&P  Patient ID: SISSY GOETZKE MRN: [id number] DOB/AGE: [birth year] 52 y.o.  Chief Complaint: left knee pain.  Planned Procedure Date: 03/09/24 Medical and Cardiac Clearance by Alan Arrant NP     HPI: Eleanna CAROLYNN TULEY is a 53 y.o. female who presents for evaluation of OA LEFT KNEE. The patient has a history of pain and functional disability in the left knee due to arthritis and has failed non-surgical conservative treatments for greater than 12 weeks to include NSAID's and/or analgesics, corticosteriod injections, viscosupplementation injections, use of assistive devices, and activity modification.  Onset of symptoms was gradual, starting 8 years ago with gradually worsening course since that time. The patient noted no past surgery on the left knee.  Patient currently rates pain at 10 out of 10 with activity. Patient has night pain, worsening of pain with activity and weight bearing, and pain that interferes with activities of daily living.  Patient has evidence of subchondral sclerosis, periarticular osteophytes, and joint space narrowing by imaging studies.  There is no active infection.  Past Medical History:  Diagnosis Date   Anxiety    Arthritis    knees, ankles (10/11/2015)   Asthma    Depression    Diabetes mellitus without complication (HCC)    Heartburn    HLD (hyperlipidemia)    Hypertension    Hypothyroidism    Kidney stones    Migraine    monthly (10/11/2015)   PONV (postoperative nausea and vomiting) X 1   Pre-diabetes    Primary hyperparathyroidism    Seasonal allergies    Sleep apnea    Stomach ulcer    Past Surgical History:  Procedure Laterality Date   BREAST BIOPSY Bilateral    neg- core   CESAREAN SECTION  2000   COLONOSCOPY WITH PROPOFOL  N/A 05/02/2022   Procedure: COLONOSCOPY WITH PROPOFOL ;  Surgeon: Unk Corinn Skiff, MD;  Location: [facility] ENDOSCOPY;  Service: Gastroenterology;  Laterality: N/A;   COLONOSCOPY  WITH PROPOFOL  N/A 07/22/2022   Procedure: COLONOSCOPY WITH PROPOFOL ;  Surgeon: Unk Corinn Skiff, MD;  Location: [facility] ENDOSCOPY;  Service: Gastroenterology;  Laterality: N/A;   ESOPHAGOGASTRODUODENOSCOPY (EGD) WITH PROPOFOL  N/A 05/02/2022   Procedure: ESOPHAGOGASTRODUODENOSCOPY (EGD) WITH PROPOFOL ;  Surgeon: Unk Corinn Skiff, MD;  Location: [facility] ENDOSCOPY;  Service: Gastroenterology;  Laterality: N/A;   JOINT REPLACEMENT     KNEE ARTHROSCOPY Right    THYROID  SURGERY     removed tumor   TOTAL HIP ARTHROPLASTY Left 09/23/2023   Procedure: ARTHROPLASTY, HIP, TOTAL, ANTERIOR APPROACH;  Surgeon: Beverley Evalene BIRCH, MD;  Location: [facility] ORS;  Service: Orthopedics;  Laterality: Left;   TOTAL KNEE ARTHROPLASTY Right 10/11/2015   TOTAL KNEE ARTHROPLASTY Right 10/11/2015   Procedure: TOTAL KNEE ARTHROPLASTY;  Surgeon: Toribio JULIANNA Beverley, MD;  Location: [facility] OR;  Service: Orthopedics;  Laterality: Right;   TUBAL LIGATION     VAGINAL HYSTERECTOMY     Allergies  Allergen Reactions   Ibuprofen  Other (See Comments)    History of Stomach Ulcers   Metronidazole Swelling and Other (See Comments)    EDEMA   Phenyltoloxamine-Acetaminophen  Hives   Other Nausea And Vomiting    Cogesic   Septra [Sulfamethoxazole-Trimethoprim] Nausea And Vomiting   Prior to Admission medications   Medication Sig Start Date End Date Taking? Authorizing Provider  amoxicillin  (AMOXIL ) 500 MG capsule Take 2,000 mg by mouth See admin instructions. Take 4 capsules (2000 mg) by mouth 1 hour prior to dental appointments. 02/09/24  Yes [provider]  Azelastine HCl 137 MCG/SPRAY SOLN USE 1 SPRAY INTO EACH NOSTRIL TWICE A DAY Patient taking differently: Place 1 spray into both nostrils 2 (two) times daily as needed (allergies). 12/29/23  Yes Orlean Alan HERO, FNP  budesonide-formoterol (SYMBICORT) 80-4.5 MCG/ACT inhaler TAKE 2 PUFFS BY MOUTH TWICE A DAY 10/09/23  Yes Orlean Alan HERO, FNP  cetirizine (ZYRTEC) 10 MG tablet Take 10  mg by mouth daily in the afternoon.   Yes [provider]  clonazePAM  (KLONOPIN ) 0.5 MG tablet TAKE 1/2 - 1 TABLET (0.25 - 0.5 MG TOTAL) BY MOUTH TWICE A DAY AS NEEDED FOR ANXIETY 12/09/23  Yes Orlean Alan HERO, FNP  Collagen-Vitamin C-Biotin (COLLAGEN PO) Take 2 each by mouth in the morning. W/biotin (Gummiest)   Yes [provider]  levothyroxine  (SYNTHROID ) 112 MCG tablet TAKE 1 TABLET BY MOUTH EVERY MORNING AT LEAST 30 MINUTES BEFORE FOOD OR OTHER MEDS. 02/05/24  Yes Orlean Alan HERO, FNP  metoprolol  succinate (TOPROL -XL) 50 MG 24 hr tablet TAKE 1 TABLET BY MOUTH EVERY DAY Patient taking differently: Take 50 mg by mouth at bedtime. 12/09/23  Yes Orlean Alan HERO, FNP  Multiple Vitamin (MULTIVITAMIN WITH MINERALS) TABS tablet Take 1 tablet by mouth daily after breakfast.   Yes [provider]  OVER THE COUNTER MEDICATION Take 2 capsules by mouth daily after breakfast. Sea Moss Black Seed Oil Ashwagandha Ginger Capsules   Yes [provider]  rosuvastatin  (CRESTOR ) 10 MG tablet TAKE 1 TABLET BY MOUTH EVERY DAY Patient taking differently: Take 10 mg by mouth at bedtime. 01/29/24  Yes Orlean Alan HERO, FNP  tirzepatide  (MOUNJARO ) 10 MG/0.5ML Pen Inject 10 mg into the skin once a week. Patient taking differently: Inject 10 mg into the skin every Friday. 12/16/23  Yes Orlean Alan HERO, FNP  traZODone  (DESYREL ) 50 MG tablet TAKE 1 TABLET BY MOUTH EVERYDAY AT BEDTIME 02/05/24  Yes Orlean Alan HERO, FNP  TRINTELLIX  20 MG TABS tablet Take 20 mg by mouth in the morning. 11/30/21  Yes [provider]  valsartan -hydrochlorothiazide  (DIOVAN -HCT) 160-12.5 MG tablet TAKE 1 TABLET BY MOUTH EVERY DAY 12/05/23  Yes Orlean Alan HERO, FNP  meloxicam  (MOBIC ) 15 MG tablet Take 1 tablet (15 mg total) by mouth daily. For 2 weeks for pain and inflammation. Then take as needed 09/23/23   McBane, Aleck SAILOR, PA-C   Social History   Socioeconomic History   Marital status: Single     Spouse name: Not on file   Number of children: Not on file   Years of education: Not on file   Highest education level: Not on file  Occupational History   Not on file  Tobacco Use   Smoking status: Never   Smokeless tobacco: Never  Vaping Use   Vaping status: Never Used  Substance and Sexual Activity   Alcohol use: Not Currently    Comment: occ   Drug use: No   Sexual activity: Yes    Birth control/protection: None    Comment: hysterectomy  Other Topics Concern   Not on file  Social History Narrative   Not on file   Social Drivers of Health   Financial Resource Strain: Not on file  Food Insecurity: No Food Insecurity (09/23/2023)   Hunger Vital Sign    Worried About Running Out of Food in the Last Year: Never true    Ran Out of Food in the Last Year: Never true  Transportation Needs: No Transportation Needs (09/23/2023)   PRAPARE - Transportation  Lack of Transportation (Medical): No    Lack of Transportation (Non-Medical): No  Physical Activity: Not on file  Stress: Not on file  Social Connections: Not on file   Family History  Problem Relation Age of Onset   Breast cancer Neg Hx    Kidney disease Neg Hx     ROS: Currently denies lightheadedness, dizziness, Fever, chills, CP, SOB.   No personal history of DVT, PE, MI, or CVA. No loose teeth. Partial dentures are present All other systems have been reviewed and were otherwise currently negative with the exception of those mentioned in the HPI and as above.  Objective: Vitals: Ht: 5'2 Wt: 215 lbs Temp: 98.2 BP: 133/84 Pulse: 74 O2 97% on room air.   Physical Exam: General: Alert, NAD.  Antalgic Gait  HEENT: EOMI, Good Neck Extension  Pulm: No increased work of breathing.  Clear B/L A/P w/o crackle or wheeze. CV: RRR, No m/g/r appreciated  GI: soft, NT, ND. BS x 4 quadrants Neuro: CN II-XII grossly intact without focal deficit.  Sensation intact distally Skin: No lesions in the area of chief  complaint MSK/Surgical Site:  + JLT. ROM 0-110 degrees.  4/5 strength in extension and flexion.  +EHL/FHL.  NVI.  Instability with varus and valgus stress but no pain.    Imaging Review Plain radiographs demonstrate severe degenerative joint disease of the left knee.   The overall alignment ismild varus. The bone quality appears to be fair for age and reported activity level.  Preoperative templating of the joint replacement has been completed, documented, and submitted to the Operating Room personnel in order to optimize intra-operative equipment management.  Assessment: OA LEFT KNEE Active Problems:   * No active hospital problems. *   Plan: Plan for Procedure(s): ARTHROPLASTY, KNEE, TOTAL  The patient history, physical exam, clinical judgement of the provider and imaging are consistent with end stage degenerative joint disease and total joint arthroplasty is deemed medically necessary. The treatment options including medical management, injection therapy, and arthroplasty were discussed at length. The risks and benefits of Procedure(s): ARTHROPLASTY, KNEE, TOTAL were presented and reviewed.  The risks of nonoperative treatment, versus surgical intervention including but not limited to continued pain, aseptic loosening, stiffness, dislocation/subluxation, infection, bleeding, nerve injury, blood clots, cardiopulmonary complications, morbidity, mortality, among others were discussed. The patient verbalizes understanding and wishes to proceed with the plan.  Patient is being admitted for inpatient treatment for surgery, pain control, PT, prophylactic antibiotics, VTE prophylaxis, progressive ambulation, ADL's and discharge planning. She wants to spend the night in observation.  Dental prophylaxis discussed and recommended for 2 years postoperatively.  The patient does meet the criteria for TXA which will be used perioperatively.   ASA 81 mg BID will be used postoperatively for DVT  prophylaxis in addition to SCDs, and early ambulation. Plan for Tylenol , Mobic , oxycodone  for pain.   Robaxin  for muscle spasms.   Zofran  for nausea and vomiting. Senokot is for constipation prevention. Pharmacy- [facility] [address] in [place] The patient is planning to be discharged home with OPPT and into the care of her mom Raoul Sayres who can be reached at [phone] Follow up appt 03/24/24 at 4:15pm    Gerard CHRISTELLA Ted DEVONNA Office [phone] 03/02/2024 11:32 AM

## 2024-03-03 ENCOUNTER — Other Ambulatory Visit: Payer: Self-pay

## 2024-03-03 ENCOUNTER — Encounter (HOSPITAL_COMMUNITY)
Admission: RE | Admit: 2024-03-03 | Discharge: 2024-03-03 | Disposition: A | Discharge status: Home / Self Care | Source: Ambulatory Visit | Attending: Orthopedic Surgery | Admitting: Orthopedic Surgery

## 2024-03-03 ENCOUNTER — Encounter (HOSPITAL_COMMUNITY): Payer: Self-pay

## 2024-03-03 VITALS — BP 122/84 | HR 72 | Temp 98.6°F | Resp 20 | Ht 64.0 in | Wt 214.0 lb

## 2024-03-03 DIAGNOSIS — I10 Essential (primary) hypertension: Secondary | ICD-10-CM | POA: Diagnosis not present

## 2024-03-03 DIAGNOSIS — M1712 Unilateral primary osteoarthritis, left knee: Secondary | ICD-10-CM | POA: Insufficient documentation

## 2024-03-03 DIAGNOSIS — N289 Disorder of kidney and ureter, unspecified: Secondary | ICD-10-CM | POA: Insufficient documentation

## 2024-03-03 DIAGNOSIS — Z01818 Encounter for other preprocedural examination: Secondary | ICD-10-CM

## 2024-03-03 DIAGNOSIS — Z01812 Encounter for preprocedural laboratory examination: Secondary | ICD-10-CM | POA: Diagnosis present

## 2024-03-03 DIAGNOSIS — R7303 Prediabetes: Secondary | ICD-10-CM

## 2024-03-03 HISTORY — DX: Gastro-esophageal reflux disease without esophagitis: K21.9

## 2024-03-03 HISTORY — DX: Personal history of urinary calculi: Z87.442

## 2024-03-03 LAB — CBC
HCT: 36.4 % (ref 36.0–46.0)
Hemoglobin: 11.4 g/dL — ABNORMAL LOW (ref 12.0–15.0)
MCH: 28 pg (ref 26.0–34.0)
MCHC: 31.3 g/dL (ref 30.0–36.0)
MCV: 89.4 fL (ref 80.0–100.0)
Platelets: 267 K/uL (ref 150–400)
RBC: 4.07 MIL/uL (ref 3.87–5.11)
RDW: 13.6 % (ref 11.5–15.5)
WBC: 5.6 K/uL (ref 4.0–10.5)
nRBC: 0 % (ref 0.0–0.2)

## 2024-03-03 LAB — BASIC METABOLIC PANEL WITH GFR
Anion gap: 10 (ref 5–15)
BUN: 6 mg/dL (ref 6–20)
CO2: 27 mmol/L (ref 22–32)
Calcium: 9.8 mg/dL (ref 8.9–10.3)
Chloride: 104 mmol/L (ref 98–111)
Creatinine, Ser: 0.89 mg/dL (ref 0.44–1.00)
GFR, Estimated: >60 mL/min (ref >60)
Glucose, Bld: 66 mg/dL — ABNORMAL LOW (ref 70–99)
Potassium: 3.4 mmol/L — ABNORMAL LOW (ref 3.5–5.1)
Sodium: 140 mmol/L (ref 135–145)

## 2024-03-03 LAB — SURGICAL PCR SCREEN
MRSA, PCR: NEGATIVE
Staphylococcus aureus: NEGATIVE

## 2024-03-04 ENCOUNTER — Ambulatory Visit: Admitting: Physical Therapy

## 2024-03-04 NOTE — Care Plan (Signed)
 Ortho Bundle Case Management Note  Patient Details  Name: Candice Watkins MRN: [id number] Date of Birth: 12-Mar-1971  met with patient in the office for H&P. will discharge to home with family to assist. CPM ordered for home use. OPPT set up with [facility] discharge instructions discussed and questions answered. Patient and MD in agreement with plan. Choice offered.                    DME Arranged:  CPM DME Agency:  [employer]  HH Arranged:    HH Agency:     Additional Comments: Please contact me with any questions of if this plan should need to change.  Charlies Pitch,  RN,BSN,MHA,CCM  [facility]opaedic Specialist  [phone] 03/04/2024, 1:40 PM

## 2024-03-08 ENCOUNTER — Ambulatory Visit: Admitting: Family

## 2024-03-08 NOTE — Anesthesia Preprocedure Evaluation (Signed)
 Anesthesia Evaluation  Patient identified by MRN, date of birth, ID band Patient awake    Reviewed: Allergy & Precautions, NPO status , Patient's Chart, lab work & pertinent test results  History of Anesthesia Complications (+) PONV and history of anesthetic complications  Airway Mallampati: II  TM Distance: >3 FB     Dental  (+) Dental Advisory Given, Caps, Partial Upper   Pulmonary asthma , sleep apnea and Continuous Positive Airway Pressure Ventilation    Pulmonary exam normal breath sounds clear to auscultation       Cardiovascular hypertension, Pt. on medications Normal cardiovascular exam Rhythm:Regular Rate:Normal     Neuro/Psych  Headaches PSYCHIATRIC DISORDERS Anxiety Depression       GI/Hepatic Neg liver ROS, PUD,GERD  Medicated,,  Endo/Other  Hypothyroidism  Obesity HLD Pre diabetes GLP-1 RA therapy- last dose  Renal/GU Renal disease  negative genitourinary   Musculoskeletal  (+) Arthritis , Osteoarthritis,  OA left knee   Abdominal  (+) + obese  Peds  Hematology  (+) Blood dyscrasia, anemia   Anesthesia Other Findings   Reproductive/Obstetrics negative OB ROS                              Anesthesia Physical Anesthesia Plan  ASA: 3  Anesthesia Plan: Spinal   Post-op Pain Management: Regional block* and Minimal or no pain anticipated   Induction: Intravenous  PONV Risk Score and Plan: 4 or greater and Treatment may vary due to age or medical condition and Propofol  infusion  Airway Management Planned: Natural Airway, Nasal Cannula and Simple Face Mask  Additional Equipment: None  Intra-op Plan:   Post-operative Plan:   Informed Consent: I have reviewed the patients History and Physical, chart, labs and discussed the procedure including the risks, benefits and alternatives for the proposed anesthesia with the patient or authorized representative who has indicated  his/her understanding and acceptance.     Dental advisory given  Plan Discussed with: CRNA and Anesthesiologist  Anesthesia Plan Comments:          Anesthesia Quick Evaluation

## 2024-03-09 ENCOUNTER — Ambulatory Visit (HOSPITAL_COMMUNITY): Payer: Self-pay | Admitting: Physician Assistant

## 2024-03-09 ENCOUNTER — Encounter (HOSPITAL_COMMUNITY): Payer: Self-pay | Admitting: Orthopedic Surgery

## 2024-03-09 ENCOUNTER — Ambulatory Visit (HOSPITAL_BASED_OUTPATIENT_CLINIC_OR_DEPARTMENT_OTHER): Payer: Self-pay | Admitting: Anesthesiology

## 2024-03-09 ENCOUNTER — Observation Stay (HOSPITAL_COMMUNITY)

## 2024-03-09 ENCOUNTER — Observation Stay (HOSPITAL_COMMUNITY)
Admission: RE | Admit: 2024-03-09 | Discharge: 2024-03-11 | Disposition: A | Discharge status: Home / Self Care | Source: Ambulatory Visit | Attending: Orthopedic Surgery | Admitting: Orthopedic Surgery

## 2024-03-09 ENCOUNTER — Encounter (HOSPITAL_COMMUNITY)
Admission: RE | Disposition: A | Discharge status: Home / Self Care | Payer: Self-pay | Source: Ambulatory Visit | Attending: Orthopedic Surgery

## 2024-03-09 ENCOUNTER — Other Ambulatory Visit: Payer: Self-pay

## 2024-03-09 DIAGNOSIS — Z79899 Other long term (current) drug therapy: Secondary | ICD-10-CM | POA: Diagnosis not present

## 2024-03-09 DIAGNOSIS — R609 Edema, unspecified: Secondary | ICD-10-CM | POA: Diagnosis not present

## 2024-03-09 DIAGNOSIS — N1831 Chronic kidney disease, stage 3a: Secondary | ICD-10-CM | POA: Diagnosis not present

## 2024-03-09 DIAGNOSIS — I10 Essential (primary) hypertension: Secondary | ICD-10-CM | POA: Diagnosis not present

## 2024-03-09 DIAGNOSIS — I129 Hypertensive chronic kidney disease with stage 1 through stage 4 chronic kidney disease, or unspecified chronic kidney disease: Secondary | ICD-10-CM | POA: Diagnosis not present

## 2024-03-09 DIAGNOSIS — E785 Hyperlipidemia, unspecified: Secondary | ICD-10-CM | POA: Diagnosis not present

## 2024-03-09 DIAGNOSIS — E039 Hypothyroidism, unspecified: Secondary | ICD-10-CM | POA: Diagnosis not present

## 2024-03-09 DIAGNOSIS — M1712 Unilateral primary osteoarthritis, left knee: Secondary | ICD-10-CM

## 2024-03-09 DIAGNOSIS — Z7982 Long term (current) use of aspirin: Secondary | ICD-10-CM | POA: Insufficient documentation

## 2024-03-09 DIAGNOSIS — Z96651 Presence of right artificial knee joint: Secondary | ICD-10-CM | POA: Diagnosis not present

## 2024-03-09 DIAGNOSIS — M25562 Pain in left knee: Secondary | ICD-10-CM | POA: Diagnosis present

## 2024-03-09 DIAGNOSIS — J45909 Unspecified asthma, uncomplicated: Secondary | ICD-10-CM | POA: Diagnosis not present

## 2024-03-09 DIAGNOSIS — Z96652 Presence of left artificial knee joint: Secondary | ICD-10-CM | POA: Diagnosis not present

## 2024-03-09 DIAGNOSIS — E119 Type 2 diabetes mellitus without complications: Secondary | ICD-10-CM | POA: Diagnosis not present

## 2024-03-09 DIAGNOSIS — Z471 Aftercare following joint replacement surgery: Secondary | ICD-10-CM | POA: Diagnosis not present

## 2024-03-09 DIAGNOSIS — G8918 Other acute postprocedural pain: Secondary | ICD-10-CM | POA: Diagnosis not present

## 2024-03-09 HISTORY — PX: TOTAL KNEE ARTHROPLASTY: SHX125

## 2024-03-09 SURGERY — ARTHROPLASTY, KNEE, TOTAL
Anesthesia: Spinal | Site: Knee | Laterality: Left

## 2024-03-09 MED ORDER — ONDANSETRON HCL 4 MG/2ML IJ SOLN: 4.0000 mg | Freq: Four times a day (QID) | INTRAMUSCULAR | Status: DC | PRN

## 2024-03-09 MED ORDER — PROPOFOL 500 MG/50ML IV EMUL
INTRAVENOUS | Status: DC | PRN
  Administered 2024-03-09: 10 mg via INTRAVENOUS
  Administered 2024-03-09: 30 mg via INTRAVENOUS
  Administered 2024-03-09: 100 ug/kg/min via INTRAVENOUS
  Administered 2024-03-09 (×3): 20 mg via INTRAVENOUS

## 2024-03-09 MED ORDER — FENTANYL CITRATE (PF) 100 MCG/2ML IJ SOLN
INTRAMUSCULAR | Status: DC | PRN
  Administered 2024-03-09 (×2): 25 ug via INTRAVENOUS
  Administered 2024-03-09: 50 ug via INTRAVENOUS

## 2024-03-09 MED ORDER — PHENYLEPHRINE HCL (PRESSORS) 10 MG/ML IV SOLN
INTRAVENOUS | Status: AC
  Filled 2024-03-09: qty 1

## 2024-03-09 MED ORDER — POVIDONE-IODINE 10 % EX SWAB: 2.0000 | Freq: Once | CUTANEOUS | Status: DC

## 2024-03-09 MED ORDER — IRBESARTAN 150 MG PO TABS
150.0000 mg | ORAL_TABLET | Freq: Every day | ORAL | Status: DC
Start: 2024-03-09 — End: 2024-03-11
  Administered 2024-03-10 – 2024-03-11 (×2): 150 mg via ORAL
  Filled 2024-03-09 (×2): qty 1

## 2024-03-09 MED ORDER — OXYCODONE HCL 5 MG PO TABS
10.0000 mg | ORAL_TABLET | ORAL | Status: DC | PRN
  Administered 2024-03-09 – 2024-03-11 (×8): 10 mg via ORAL
  Filled 2024-03-09 (×8): qty 2

## 2024-03-09 MED ORDER — OXYCODONE HCL 5 MG PO TABS
5.0000 mg | ORAL_TABLET | ORAL | Status: DC | PRN
  Administered 2024-03-09 – 2024-03-11 (×3): 5 mg via ORAL
  Filled 2024-03-09 (×3): qty 1

## 2024-03-09 MED ORDER — SODIUM CHLORIDE (PF) 0.9 % IJ SOLN
INTRAMUSCULAR | Status: AC
  Filled 2024-03-09: qty 10

## 2024-03-09 MED ORDER — BUPIVACAINE LIPOSOME 1.3 % IJ SUSP
INTRAMUSCULAR | Status: DC | PRN
  Administered 2024-03-09: 20 mL

## 2024-03-09 MED ORDER — METOCLOPRAMIDE HCL 5 MG/ML IJ SOLN: 5.0000 mg | Freq: Three times a day (TID) | INTRAMUSCULAR | Status: DC | PRN

## 2024-03-09 MED ORDER — LORATADINE 10 MG PO TABS
10.0000 mg | ORAL_TABLET | Freq: Every day | ORAL | Status: DC
  Administered 2024-03-09 – 2024-03-11 (×3): 10 mg via ORAL
  Filled 2024-03-09 (×3): qty 1

## 2024-03-09 MED ORDER — LIDOCAINE HCL (PF) 2 % IJ SOLN
INTRAMUSCULAR | Status: AC
  Filled 2024-03-09: qty 5

## 2024-03-09 MED ORDER — DOCUSATE SODIUM 100 MG PO CAPS
100.0000 mg | ORAL_CAPSULE | Freq: Two times a day (BID) | ORAL | Status: DC
  Administered 2024-03-09 – 2024-03-11 (×4): 100 mg via ORAL
  Filled 2024-03-09 (×4): qty 1

## 2024-03-09 MED ORDER — OXYCODONE HCL 5 MG/5ML PO SOLN: 5.0000 mg | Freq: Once | ORAL | Status: DC | PRN

## 2024-03-09 MED ORDER — PROPOFOL 1000 MG/100ML IV EMUL
INTRAVENOUS | Status: AC
  Filled 2024-03-09: qty 100

## 2024-03-09 MED ORDER — MIDAZOLAM HCL 2 MG/2ML IJ SOLN
INTRAMUSCULAR | Status: AC
  Filled 2024-03-09: qty 2

## 2024-03-09 MED ORDER — OXYCODONE HCL 5 MG PO TABS: 5.0000 mg | ORAL_TABLET | Freq: Once | ORAL | Status: DC | PRN

## 2024-03-09 MED ORDER — TRANEXAMIC ACID-NACL 1000-0.7 MG/100ML-% IV SOLN
1000.0000 mg | INTRAVENOUS | Status: AC
Start: 2024-03-09 — End: 2024-03-09
  Administered 2024-03-09: 1000 mg via INTRAVENOUS
  Filled 2024-03-09: qty 100

## 2024-03-09 MED ORDER — CEFAZOLIN SODIUM-DEXTROSE 2-4 GM/100ML-% IV SOLN
2.0000 g | INTRAVENOUS | Status: AC
  Administered 2024-03-09: 2 g via INTRAVENOUS
  Filled 2024-03-09: qty 100

## 2024-03-09 MED ORDER — DEXAMETHASONE SOD PHOSPHATE PF 10 MG/ML IJ SOLN
10.0000 mg | Freq: Once | INTRAMUSCULAR | Status: AC
  Administered 2024-03-10: 10 mg via INTRAVENOUS

## 2024-03-09 MED ORDER — METOPROLOL SUCCINATE ER 50 MG PO TB24
50.0000 mg | ORAL_TABLET | Freq: Every day | ORAL | Status: DC
  Administered 2024-03-09 – 2024-03-10 (×2): 50 mg via ORAL
  Filled 2024-03-09 (×2): qty 1

## 2024-03-09 MED ORDER — BISACODYL 10 MG RE SUPP: 10.0000 mg | Freq: Every day | RECTAL | Status: DC | PRN

## 2024-03-09 MED ORDER — ORAL CARE MOUTH RINSE: 15.0000 mL | Freq: Once | OROMUCOSAL | Status: AC

## 2024-03-09 MED ORDER — ONDANSETRON HCL 4 MG PO TABS: 4.0000 mg | ORAL_TABLET | Freq: Four times a day (QID) | ORAL | Status: DC | PRN

## 2024-03-09 MED ORDER — ACETAMINOPHEN 500 MG PO TABS
1000.0000 mg | ORAL_TABLET | Freq: Four times a day (QID) | ORAL | Status: AC
  Administered 2024-03-09 – 2024-03-10 (×4): 1000 mg via ORAL
  Filled 2024-03-09 (×4): qty 2

## 2024-03-09 MED ORDER — METHOCARBAMOL 1000 MG/10ML IJ SOLN: 500.0000 mg | Freq: Four times a day (QID) | INTRAMUSCULAR | Status: DC | PRN

## 2024-03-09 MED ORDER — TRAZODONE HCL 50 MG PO TABS
50.0000 mg | ORAL_TABLET | Freq: Every day | ORAL | Status: DC
  Administered 2024-03-09 – 2024-03-10 (×2): 50 mg via ORAL
  Filled 2024-03-09 (×2): qty 1

## 2024-03-09 MED ORDER — METOCLOPRAMIDE HCL 5 MG PO TABS: 5.0000 mg | ORAL_TABLET | Freq: Three times a day (TID) | ORAL | Status: DC | PRN

## 2024-03-09 MED ORDER — ONDANSETRON HCL 4 MG/2ML IJ SOLN
INTRAMUSCULAR | Status: AC
  Filled 2024-03-09: qty 2

## 2024-03-09 MED ORDER — FENTANYL CITRATE (PF) 100 MCG/2ML IJ SOLN
INTRAMUSCULAR | Status: AC
  Filled 2024-03-09: qty 2

## 2024-03-09 MED ORDER — DEXAMETHASONE SOD PHOSPHATE PF 10 MG/ML IJ SOLN
8.0000 mg | Freq: Once | INTRAMUSCULAR | Status: AC
  Administered 2024-03-09: 8 mg via INTRAVENOUS

## 2024-03-09 MED ORDER — LACTATED RINGERS IV SOLN: INTRAVENOUS | Status: DC

## 2024-03-09 MED ORDER — 0.9 % SODIUM CHLORIDE (POUR BTL) OPTIME
TOPICAL | Status: DC | PRN
  Administered 2024-03-09: 1000 mL

## 2024-03-09 MED ORDER — BUPIVACAINE IN DEXTROSE 0.75-8.25 % IT SOLN
INTRATHECAL | Status: DC | PRN
  Administered 2024-03-09: 1.7 mL via INTRATHECAL

## 2024-03-09 MED ORDER — BUPIVACAINE-EPINEPHRINE 0.25% -1:200000 IJ SOLN
INTRAMUSCULAR | Status: DC | PRN
  Administered 2024-03-09: 30 mL

## 2024-03-09 MED ORDER — PHENYLEPHRINE 80 MCG/ML (10ML) SYRINGE FOR IV PUSH (FOR BLOOD PRESSURE SUPPORT)
PREFILLED_SYRINGE | INTRAVENOUS | Status: DC | PRN
Start: 2024-03-09 — End: 2024-03-09
  Administered 2024-03-09 (×4): 160 ug via INTRAVENOUS

## 2024-03-09 MED ORDER — BUPIVACAINE LIPOSOME 1.3 % IJ SUSP
INTRAMUSCULAR | Status: AC
  Filled 2024-03-09: qty 20

## 2024-03-09 MED ORDER — VALSARTAN-HYDROCHLOROTHIAZIDE 160-12.5 MG PO TABS: 1.0000 | ORAL_TABLET | Freq: Every day | ORAL | Status: DC

## 2024-03-09 MED ORDER — MIDAZOLAM HCL 5 MG/5ML IJ SOLN
INTRAMUSCULAR | Status: DC | PRN
  Administered 2024-03-09: 2 mg via INTRAVENOUS

## 2024-03-09 MED ORDER — ASPIRIN 81 MG PO CHEW
81.0000 mg | CHEWABLE_TABLET | Freq: Two times a day (BID) | ORAL | Status: DC
  Administered 2024-03-09 – 2024-03-11 (×4): 81 mg via ORAL
  Filled 2024-03-09 (×4): qty 1

## 2024-03-09 MED ORDER — SODIUM CHLORIDE 0.9% FLUSH
INTRAVENOUS | Status: DC | PRN
  Administered 2024-03-09: 30 mL

## 2024-03-09 MED ORDER — INFLUENZA VIRUS VACC SPLIT PF (FLUZONE) 0.5 ML IM SUSY: 0.5000 mL | PREFILLED_SYRINGE | INTRAMUSCULAR | Status: DC

## 2024-03-09 MED ORDER — PHENOL 1.4 % MT LIQD: 1.0000 | OROMUCOSAL | Status: DC | PRN

## 2024-03-09 MED ORDER — FLUTICASONE FUROATE-VILANTEROL 100-25 MCG/ACT IN AEPB
1.0000 | INHALATION_SPRAY | Freq: Every day | RESPIRATORY_TRACT | Status: DC
  Administered 2024-03-09 – 2024-03-11 (×3): 1 via RESPIRATORY_TRACT
  Filled 2024-03-09: qty 28

## 2024-03-09 MED ORDER — ONDANSETRON HCL 4 MG/2ML IJ SOLN
INTRAMUSCULAR | Status: DC | PRN
  Administered 2024-03-09: 4 mg via INTRAVENOUS

## 2024-03-09 MED ORDER — ALUM & MAG HYDROXIDE-SIMETH 200-200-20 MG/5ML PO SUSP: 30.0000 mL | ORAL | Status: DC | PRN

## 2024-03-09 MED ORDER — CEFAZOLIN SODIUM-DEXTROSE 2-4 GM/100ML-% IV SOLN
2.0000 g | Freq: Four times a day (QID) | INTRAVENOUS | Status: AC
  Administered 2024-03-09 (×2): 2 g via INTRAVENOUS
  Filled 2024-03-09 (×2): qty 100

## 2024-03-09 MED ORDER — HYDROCHLOROTHIAZIDE 12.5 MG PO TABS
12.5000 mg | ORAL_TABLET | Freq: Every day | ORAL | Status: DC
  Administered 2024-03-10 – 2024-03-11 (×2): 12.5 mg via ORAL
  Filled 2024-03-09 (×2): qty 1

## 2024-03-09 MED ORDER — LIDOCAINE HCL (PF) 2 % IJ SOLN
INTRAMUSCULAR | Status: DC | PRN
  Administered 2024-03-09 (×2): 25 mg via INTRADERMAL

## 2024-03-09 MED ORDER — MAGNESIUM CITRATE PO SOLN: 1.0000 | Freq: Once | ORAL | Status: DC | PRN

## 2024-03-09 MED ORDER — BUPIVACAINE-EPINEPHRINE (PF) 0.25% -1:200000 IJ SOLN
INTRAMUSCULAR | Status: AC
  Filled 2024-03-09: qty 30

## 2024-03-09 MED ORDER — ACETAMINOPHEN 500 MG PO TABS
1000.0000 mg | ORAL_TABLET | Freq: Once | ORAL | Status: AC
  Administered 2024-03-09: 1000 mg via ORAL
  Filled 2024-03-09: qty 2

## 2024-03-09 MED ORDER — WATER FOR IRRIGATION, STERILE IR SOLN
Status: DC | PRN
  Administered 2024-03-09: 1000 mL

## 2024-03-09 MED ORDER — DIPHENHYDRAMINE HCL 12.5 MG/5ML PO ELIX
12.5000 mg | ORAL_SOLUTION | ORAL | Status: DC | PRN
  Administered 2024-03-10: 25 mg via ORAL
  Filled 2024-03-09: qty 10

## 2024-03-09 MED ORDER — CHLORHEXIDINE GLUCONATE 0.12 % MT SOLN
15.0000 mL | Freq: Once | OROMUCOSAL | Status: AC
  Administered 2024-03-09: 15 mL via OROMUCOSAL

## 2024-03-09 MED ORDER — ONDANSETRON HCL 4 MG/2ML IJ SOLN: 4.0000 mg | Freq: Once | INTRAMUSCULAR | Status: DC | PRN

## 2024-03-09 MED ORDER — ADULT MULTIVITAMIN W/MINERALS CH
1.0000 | ORAL_TABLET | Freq: Every day | ORAL | Status: DC
  Administered 2024-03-10 – 2024-03-11 (×2): 1 via ORAL
  Filled 2024-03-09 (×2): qty 1

## 2024-03-09 MED ORDER — ACETAMINOPHEN 325 MG PO TABS
325.0000 mg | ORAL_TABLET | Freq: Four times a day (QID) | ORAL | Status: DC | PRN
  Administered 2024-03-10: 650 mg via ORAL
  Filled 2024-03-09: qty 2

## 2024-03-09 MED ORDER — METHOCARBAMOL 500 MG PO TABS
500.0000 mg | ORAL_TABLET | Freq: Four times a day (QID) | ORAL | Status: DC | PRN
  Administered 2024-03-09 – 2024-03-11 (×5): 500 mg via ORAL
  Filled 2024-03-09 (×5): qty 1

## 2024-03-09 MED ORDER — POLYETHYLENE GLYCOL 3350 17 G PO PACK: 17.0000 g | PACK | Freq: Every day | ORAL | Status: DC | PRN

## 2024-03-09 MED ORDER — LEVOTHYROXINE SODIUM 112 MCG PO TABS
112.0000 ug | ORAL_TABLET | Freq: Every day | ORAL | Status: DC
  Administered 2024-03-10 – 2024-03-11 (×2): 112 ug via ORAL
  Filled 2024-03-09 (×2): qty 1

## 2024-03-09 MED ORDER — PHENYLEPHRINE HCL-NACL 20-0.9 MG/250ML-% IV SOLN
INTRAVENOUS | Status: DC | PRN
  Administered 2024-03-09: 40 ug/min via INTRAVENOUS

## 2024-03-09 MED ORDER — BUPIVACAINE LIPOSOME 1.3 % IJ SUSP: 20.0000 mL | Freq: Once | INTRAMUSCULAR | Status: DC

## 2024-03-09 MED ORDER — SODIUM CHLORIDE 0.9 % IR SOLN
Status: DC | PRN
  Administered 2024-03-09: 1000 mL

## 2024-03-09 MED ORDER — VORTIOXETINE HBR 5 MG PO TABS
20.0000 mg | ORAL_TABLET | Freq: Every day | ORAL | Status: DC
  Administered 2024-03-10 – 2024-03-11 (×2): 20 mg via ORAL
  Filled 2024-03-09 (×2): qty 4

## 2024-03-09 MED ORDER — TRANEXAMIC ACID-NACL 1000-0.7 MG/100ML-% IV SOLN
1000.0000 mg | Freq: Once | INTRAVENOUS | Status: AC
  Administered 2024-03-09: 1000 mg via INTRAVENOUS
  Filled 2024-03-09: qty 100

## 2024-03-09 MED ORDER — HYDROMORPHONE HCL 1 MG/ML IJ SOLN: 0.2500 mg | INTRAMUSCULAR | Status: DC | PRN

## 2024-03-09 MED ORDER — HYDROMORPHONE HCL 1 MG/ML IJ SOLN
0.5000 mg | INTRAMUSCULAR | Status: DC | PRN
  Administered 2024-03-09 – 2024-03-10 (×2): 1 mg via INTRAVENOUS
  Filled 2024-03-09 (×3): qty 1

## 2024-03-09 MED ORDER — ROPIVACAINE HCL 5 MG/ML IJ SOLN
INTRAMUSCULAR | Status: DC | PRN
  Administered 2024-03-09: 25 mL via PERINEURAL

## 2024-03-09 MED ORDER — PANTOPRAZOLE SODIUM 40 MG PO TBEC
40.0000 mg | DELAYED_RELEASE_TABLET | Freq: Every day | ORAL | Status: DC
  Administered 2024-03-09 – 2024-03-11 (×3): 40 mg via ORAL
  Filled 2024-03-09 (×3): qty 1

## 2024-03-09 MED ORDER — GLYCOPYRROLATE 0.2 MG/ML IJ SOLN
INTRAMUSCULAR | Status: DC | PRN
  Administered 2024-03-09: .1 mg via INTRAVENOUS

## 2024-03-09 MED ORDER — MENTHOL 3 MG MT LOZG
1.0000 | LOZENGE | OROMUCOSAL | Status: DC | PRN
  Administered 2024-03-09: 3 mg via ORAL
  Filled 2024-03-09: qty 9

## 2024-03-09 MED ORDER — ROSUVASTATIN CALCIUM 10 MG PO TABS
10.0000 mg | ORAL_TABLET | Freq: Every day | ORAL | Status: DC
  Administered 2024-03-09 – 2024-03-10 (×2): 10 mg via ORAL
  Filled 2024-03-09 (×2): qty 1

## 2024-03-09 SURGICAL SUPPLY — 43 items
BAG COUNTER SPONGE SURGICOUNT (BAG) IMPLANT
BLADE SAG 18X100X1.27 (BLADE) ×1 IMPLANT
BLADE SAGITTAL 25.0X1.37X90 (BLADE) ×1 IMPLANT
BLADE SURG 15 STRL LF DISP TIS (BLADE) ×1 IMPLANT
BNDG ELASTIC 6X10 VLCR STRL LF (GAUZE/BANDAGES/DRESSINGS) ×1 IMPLANT
BOWL SMART MIX CTS (DISPOSABLE) IMPLANT
CLSR STERI-STRIP ANTIMIC 1/2X4 (GAUZE/BANDAGES/DRESSINGS) ×2 IMPLANT
COMPONENT FEMRL CMNTLS SZ2 TRI (Joint) IMPLANT
COVER SURGICAL LIGHT HANDLE (MISCELLANEOUS) ×1 IMPLANT
CUFF TRNQT CYL 34X4.125X (TOURNIQUET CUFF) ×1 IMPLANT
DRAPE U-SHAPE 47X51 STRL (DRAPES) ×1 IMPLANT
DRSG MEPILEX POST OP 4X12 (GAUZE/BANDAGES/DRESSINGS) ×1 IMPLANT
DURAPREP 26ML APPLICATOR (WOUND CARE) ×2 IMPLANT
ELECT PENCIL ROCKER SW 15FT (MISCELLANEOUS) ×1 IMPLANT
ELECT REM PT RETURN 15FT ADLT (MISCELLANEOUS) ×1 IMPLANT
GLOVE BIO SURGEON STRL SZ7.5 (GLOVE) ×1 IMPLANT
GLOVE BIOGEL PI IND STRL 7.5 (GLOVE) ×1 IMPLANT
GLOVE BIOGEL PI IND STRL 8 (GLOVE) ×1 IMPLANT
GLOVE SURG SYN 7.0 PF PI (GLOVE) ×1 IMPLANT
GOWN STRL REUS W/ TWL LRG LVL3 (GOWN DISPOSABLE) ×1 IMPLANT
GOWN STRL REUS W/ TWL XL LVL3 (GOWN DISPOSABLE) ×1 IMPLANT
HOLDER FOLEY CATH W/STRAP (MISCELLANEOUS) IMPLANT
IMMOBILIZER KNEE 20 THIGH 36 (SOFTGOODS) IMPLANT
IMMOBILIZER KNEE 22 UNIV (SOFTGOODS) IMPLANT
INSERT TIB CS TRIATH X3 9 (Insert) IMPLANT
KIT TURNOVER KIT A (KITS) ×1 IMPLANT
KNEE PATELLA ASYMMETRIC 10X32 (Knees) IMPLANT
KNEE TIBIAL COMPONENT SZ3 (Knees) IMPLANT
MANIFOLD NEPTUNE II (INSTRUMENTS) ×1 IMPLANT
NS IRRIG 1000ML POUR BTL (IV SOLUTION) ×1 IMPLANT
PACK TOTAL KNEE CUSTOM (KITS) ×1 IMPLANT
PIN FLUTED HEDLESS FIX 3.5X1/8 (PIN) IMPLANT
PROTECTOR NERVE ULNAR (MISCELLANEOUS) ×1 IMPLANT
SET HNDPC FAN SPRY TIP SCT (DISPOSABLE) ×1 IMPLANT
SPIKE FLUID TRANSFER (MISCELLANEOUS) ×1 IMPLANT
SUT MNCRL AB 3-0 PS2 18 (SUTURE) ×1 IMPLANT
SUT VIC AB 0 CT1 36 (SUTURE) ×2 IMPLANT
SUT VIC AB 1 CT1 36 (SUTURE) ×1 IMPLANT
SUT VIC AB 2-0 CT1 TAPERPNT 27 (SUTURE) ×1 IMPLANT
TOWEL GREEN STERILE FF (TOWEL DISPOSABLE) ×1 IMPLANT
TRAY FOLEY MTR SLVR 16FR STAT (SET/KITS/TRAYS/PACK) IMPLANT
TUBE SUCTION HIGH CAP CLEAR NV (SUCTIONS) ×1 IMPLANT
WRAP KNEE MAXI GEL POST OP (GAUZE/BANDAGES/DRESSINGS) ×1 IMPLANT

## 2024-03-09 NOTE — Interval H&P Note (Signed)
 History and Physical Interval Note:  03/09/2024 7:39 AM  Candice Watkins  has presented today for surgery, with the diagnosis of OA LEFT KNEE.  The various methods of treatment have been discussed with the patient and family. After consideration of risks, benefits and other options for treatment, the patient has consented to  Procedure(s): ARTHROPLASTY, KNEE, TOTAL (Left) as a surgical intervention.  The patient's history has been reviewed, patient examined, no change in status, stable for surgery.  I have reviewed the patient's chart and labs.  Questions were answered to the patient's satisfaction.     [name]

## 2024-03-09 NOTE — Anesthesia Procedure Notes (Signed)
 Anesthesia Regional Block: Adductor canal block   Pre-Anesthetic Checklist: , timeout performed,  Correct Patient, Correct Site, Correct Laterality,  Correct Procedure, Correct Position, site marked,  Risks and benefits discussed,  Surgical consent,  Pre-op evaluation,  At surgeon's request and post-op pain management  Laterality: Left  Prep: chloraprep       Needles:  Injection technique: Single-shot  Needle Type: Echogenic Stimulator Needle     Needle Length: 10cm  Needle Gauge: 21   Needle insertion depth: 8 cm   Additional Needles:   Procedures:,,,, ultrasound used (permanent image in chart),,    Narrative:  Start time: 03/09/2024 7:18 AM End time: 03/09/2024 7:23 AM Injection made incrementally with aspirations every 5 mL.  Performed by: Personally  Anesthesiologist: Jerrye Sharper, MD  Additional Notes: Timeout performed. Patient sedated. Relevant anatomy ID'd using US . Incremental 2-5ml injection of LA with frequent aspiration. Patient tolerated procedure well.

## 2024-03-09 NOTE — Anesthesia Procedure Notes (Signed)
 Procedure Name: MAC Date/Time: 03/09/2024 8:09 AM  Performed by: Erick Fitz, CRNAPre-anesthesia Checklist: Patient identified, Emergency Drugs available, Suction available, Patient being monitored and Timeout performed Patient Re-evaluated:Patient Re-evaluated prior to induction Oxygen Delivery Method: Simple face mask Preoxygenation: Pre-oxygenation with 100% oxygen Induction Type: IV induction Placement Confirmation: positive ETCO2 and CO2 detector Dental Injury: Teeth and Oropharynx as per pre-operative assessment

## 2024-03-09 NOTE — Evaluation (Signed)
 Physical Therapy Evaluation Patient Details Name: Candice Watkins MRN: [id number] DOB: 06/11/1970 Today's Date: 03/09/2024  History of Present Illness  Pt is 53 yo female s/p L TKA on 03/09/24.  Pt with hx including but not limited to arthritis, anxiety, DM, HTN, PONV, R TKA 2017, L THA 5/25  Clinical Impression  Pt is s/p TKA resulting in the deficits listed below (see PT Problem List). At baseline, pt is independent.  She will have support at d/c and has DME.  Today, pt with fair pain control and good quad activation.  She was able to ambulate 30' with RW and CGA.  Pt expected to progress well.  Pt will benefit from acute skilled PT to increase their independence and safety with mobility to allow discharge.          If plan is discharge home, recommend the following: A little help with walking and/or transfers;A little help with bathing/dressing/bathroom;Assistance with cooking/housework;Help with stairs or ramp for entrance   Can travel by private vehicle        Equipment Recommendations None recommended by PT  Recommendations for Other Services       Functional Status Assessment Patient has had a recent decline in their functional status and demonstrates the ability to make significant improvements in function in a reasonable and predictable amount of time.     Precautions / Restrictions Precautions Precautions: Fall;Knee Restrictions Weight Bearing Restrictions Per Provider Order: Yes LLE Weight Bearing Per Provider Order: Weight bearing as tolerated      Mobility  Bed Mobility Overal bed mobility: Needs Assistance Bed Mobility: Supine to Sit     Supine to sit: Min assist          Transfers Overall transfer level: Needs assistance Equipment used: Rolling walker (2 wheels) Transfers: Sit to/from Stand Sit to Stand: Contact guard assist           General transfer comment: cues for hand placement    Ambulation/Gait Ambulation/Gait assistance:  Contact guard assist Gait Distance (Feet): 30 Feet Assistive device: Rolling walker (2 wheels) Gait Pattern/deviations: Step-to pattern, Decreased stride length, Antalgic, Decreased weight shift to left Gait velocity: decreased     General Gait Details: cues for sequencign and small steps for pain control  Stairs            Wheelchair Mobility     Tilt Bed    Modified Rankin (Stroke Patients Only)       Balance Overall balance assessment: Needs assistance Sitting-balance support: No upper extremity supported Sitting balance-Leahy Scale: Good     Standing balance support: Bilateral upper extremity supported, Reliant on assistive device for balance Standing balance-Leahy Scale: Poor                               Pertinent Vitals/Pain Pain Assessment Pain Assessment: 0-10 Pain Score: 5  Pain Location: L knee Pain Descriptors / Indicators: Discomfort Pain Intervention(s): Limited activity within patient's tolerance, Monitored during session, Premedicated before session, Repositioned    Home Living Family/patient expects to be discharged to:: Private residence Living Arrangements: Alone Available Help at Discharge: Available 24 hours/day;Family (staying with family at d/c) Type of Home: House Home Access: Stairs to enter Entrance Stairs-Rails: Left Entrance Stairs-Number of Steps: 6   Home Layout: One level Home Equipment: Agricultural consultant (2 wheels);Cane - single point      Prior Function Prior Level of Function : Independent/Modified Independent;Driving  Mobility Comments: Could ambulate in communtiy ;occasionally using cane due to knee pain and buckling ADLs Comments: independent adls and iadls     Extremity/Trunk Assessment   Upper Extremity Assessment Upper Extremity Assessment: Overall WFL for tasks assessed    Lower Extremity Assessment Lower Extremity Assessment: LLE deficits/detail;RLE deficits/detail RLE Deficits /  Details: ROM WFL; MMT 5/5 RLE Sensation: WNL LLE Deficits / Details: Expected post op changes; ROM 0 to 50 degrees; MMT ankle 5/5, knee and hip 3/5 not further tested LLE Sensation: WNL    Cervical / Trunk Assessment Cervical / Trunk Assessment: Normal  Communication        Cognition Arousal: Alert Behavior During Therapy: WFL for tasks assessed/performed   PT - Cognitive impairments: No apparent impairments                                 Cueing       General Comments General comments (skin integrity, edema, etc.): Pt wtih c/o wraps too tight - ACE wrap did not appear too tight but does have TED hose under - notified nursing of complaint    Exercises     Assessment/Plan    PT Assessment Patient needs continued PT services  PT Problem List Decreased strength;Decreased mobility;Decreased range of motion;Decreased activity tolerance;Decreased balance;Decreased knowledge of use of DME;Pain       PT Treatment Interventions DME instruction;Therapeutic exercise;Gait training;Stair training;Functional mobility training;Therapeutic activities;Patient/family education;Modalities;Balance training    PT Goals (Current goals can be found in the Care Plan section)  Acute Rehab PT Goals Patient Stated Goal: return home PT Goal Formulation: With patient/family Time For Goal Achievement: 03/23/24 Potential to Achieve Goals: Good    Frequency 7X/week     Co-evaluation               AM-PAC PT 6 Clicks Mobility  Outcome Measure Help needed turning from your back to your side while in a flat bed without using bedrails?: A Little Help needed moving from lying on your back to sitting on the side of a flat bed without using bedrails?: A Little Help needed moving to and from a bed to a chair (including a wheelchair)?: A Little Help needed standing up from a chair using your arms (e.g., wheelchair or bedside chair)?: A Little Help needed to walk in hospital room?: A  Little Help needed climbing 3-5 steps with a railing? : A Little 6 Click Score: 18    End of Session Equipment Utilized During Treatment: Gait belt Activity Tolerance: Patient tolerated treatment well Patient left: with chair alarm set;in chair;with call bell/phone within reach;with family/visitor present Nurse Communication: Mobility status PT Visit Diagnosis: Other abnormalities of gait and mobility (R26.89);Muscle weakness (generalized) (M62.81)    Time: 8388-8361 PT Time Calculation (min) (ACUTE ONLY): 27 min   Charges:   PT Evaluation $PT Eval Low Complexity: 1 Low PT Treatments $Gait Training: 8-22 mins PT General Charges $$ ACUTE PT VISIT: 1 Visit         Benjiman, PT Acute Rehab [facility] [phone]   Benjiman VEAR Mulberry 03/09/2024, 5:18 PM

## 2024-03-09 NOTE — Plan of Care (Signed)

## 2024-03-09 NOTE — Progress Notes (Signed)
 Orthopedic Tech Progress Note Patient Details:  Candice Watkins Jul 03, 1970 [id number]  CPM Left Knee CPM Left Knee: On Left Knee Flexion (Degrees): 90 Left Knee Extension (Degrees): 0  Post Interventions Patient Tolerated: Well Instructions Provided: Adjustment of device, Care of device  Waylan Thom Loving 03/09/2024, 10:36 AM

## 2024-03-09 NOTE — Discharge Instructions (Addendum)

## 2024-03-09 NOTE — Anesthesia Postprocedure Evaluation (Signed)
 Anesthesia Post Note  Patient: Candice Watkins  Procedure(s) Performed: ARTHROPLASTY, KNEE, TOTAL (Left: Knee)     Patient location during evaluation: PACU Anesthesia Type: Spinal Level of consciousness: oriented and awake and alert Pain management: pain level controlled Vital Signs Assessment: post-procedure vital signs reviewed and stable Respiratory status: spontaneous breathing, respiratory function stable and nonlabored ventilation Cardiovascular status: blood pressure returned to baseline and stable Postop Assessment: no headache, no backache, no apparent nausea or vomiting, spinal receding and patient able to bend at knees Anesthetic complications: no   There were no known notable events for this encounter.  Last Vitals:  Vitals:   03/09/24 1015 03/09/24 1030  BP: 119/81 120/75  Pulse: (!) 59 60  Resp: 20 (!) 22  Temp:    SpO2: 98% 100%    Last Pain:  Vitals:   03/09/24 1030  TempSrc:   PainSc: 0-No pain      LLE Sensation: Decreased (03/09/24 1030)          Leone Mobley A.

## 2024-03-09 NOTE — Transfer of Care (Signed)
 Immediate Anesthesia Transfer of Care Note  Patient: Candice Watkins  Procedure(s) Performed: ARTHROPLASTY, KNEE, TOTAL (Left: Knee)  Patient Location: PACU  Anesthesia Type:MAC and Spinal  Level of Consciousness: awake, alert , oriented, and patient cooperative  Airway & Oxygen Therapy: Patient Spontanous Breathing and Patient connected to face mask oxygen  Post-op Assessment: Report given to RN and Post -op Vital signs reviewed and stable  Post vital signs: Reviewed and stable  Last Vitals:  Vitals Value Taken Time  BP 124/50 03/09/24 09:46  Temp    Pulse 76 03/09/24 09:49  Resp 13 03/09/24 09:49  SpO2 99 % 03/09/24 09:49  Vitals shown include unfiled device data.  Last Pain:  Vitals:   03/09/24 0546  TempSrc:   PainSc: 0-No pain         Complications: There were no known notable events for this encounter.

## 2024-03-09 NOTE — Progress Notes (Signed)
 Orthopedic Tech Progress Note Patient Details:  Candice Watkins Aug 26, 1970 [id number]  Ortho Devices Type of Ortho Device: Bone foam zero knee, CPM padding Ortho Device/Splint Interventions: Ordered, Application, Adjustment   Post Interventions Patient Tolerated: Well Instructions Provided: Adjustment of device, Care of device  Waylan Thom Loving 03/09/2024, 10:36 AM

## 2024-03-09 NOTE — Progress Notes (Signed)
 Orthopedic Tech Progress Note Patient Details:  Candice Watkins [birth year] [id number]  CPM Left Knee CPM Left Knee: Off Left Knee Flexion (Degrees): 90 Left Knee Extension (Degrees): 0  Post Interventions Patient Tolerated: Well Instructions Provided: Adjustment of device, Care of device  Waylan Thom Loving 03/09/2024, 2:34 PM

## 2024-03-09 NOTE — Op Note (Signed)
 DATE OF SURGERY:  03/09/2024 TIME: 8:54 AM  PATIENT NAME:  Candice Watkins   AGE: 53 y.o.    PRE-OPERATIVE DIAGNOSIS:  OA LEFT KNEE  POST-OPERATIVE DIAGNOSIS:  Same  PROCEDURE:  Procedure(s): ARTHROPLASTY, KNEE, TOTAL   SURGEON:  [name], MD   ASSISTANT:  Gerard Large, PA-C, she was present and scrubbed throughout the case, critical for completion in a timely fashion, and for retraction, instrumentation, and closure.    OPERATIVE IMPLANTS: [employer] Triathlon CR. Press fit knee  Femur size 2, Tibia size 3, Patella size 32 3-peg oval button, with a 9 mm polyethylene insert.   PREOPERATIVE INDICATIONS:  [name] is a 53 y.o. year old female with end stage bone on bone degenerative arthritis of the knee who failed conservative treatment, including injections, antiinflammatories, activity modification, and assistive devices, and had significant impairment of their activities of daily living, and elected for Total Knee Arthroplasty.   The risks, benefits, and alternatives were discussed at length including but not limited to the risks of infection, bleeding, nerve injury, stiffness, blood clots, the need for revision surgery, cardiopulmonary complications, among others, and they were willing to proceed.   OPERATIVE DESCRIPTION:  The patient was brought to the operative room and placed in a supine position.  General anesthesia was administered.  IV antibiotics were given.  The lower extremity was prepped and draped in the usual sterile fashion.  Time out was performed.  The leg was elevated and exsanguinated and the tourniquet was inflated.  Anterior approach was performed.  The patella was everted and osteophytes were removed.  The anterior horn of the medial and lateral meniscus was removed.   The distal femur was opened with the drill and the intramedullary distal femoral cutting jig was utilized, set at 5 degrees resecting 8 mm off the distal femur.  Care  was taken to protect the collateral ligaments.  The distal femoral sizing jig was applied, taking care to avoid notching.  Then the 4-in-1 cutting jig was applied and the anterior and posterior femur was cut, along with the chamfer cuts.  All posterior osteophytes were removed.  The flexion gap was then measured and was symmetric with the extension gap.  Then the extramedullary tibial cutting jig was utilized making the appropriate cut using the anterior tibial crest as a reference building in appropriate posterior slope.  Care was taken during the cut to protect the medial and collateral ligaments.  The proximal tibia was removed along with the posterior horns of the menisci.    I completed the distal femoral preparation using the appropriate jig to prepare the box.  The patella was then measured, and cut with the saw.    The proximal tibia sized and prepared accordingly with the reamer and the punch, and then all components were trialed with the above sized poly insert.  The knee was found to have excellent balance and full motion.    The above named components were then impacted into place and Poly tibial piece and patella were inserted.  I was very happy with his stability and ROM  I performed a periarticular injection with Exparel   The knee was easily taken through a range of motion and the patella tracked well and the knee irrigated copiously and the parapatellar and subcutaneous tissue closed with vicryl, and monocryl with steri strips for the skin.  The incision was dressed with sterile gauze and the tourniquet released and the patient was awakened and returned to the  PACU in stable and satisfactory condition.  There were no complications.  Total tourniquet time was roughly 60 minutes.   POSTOPERATIVE PLAN: post op Abx, DVT px: SCD's, TED's, Early ambulation and chemical px

## 2024-03-09 NOTE — Anesthesia Procedure Notes (Signed)
 Spinal  Patient location during procedure: OR Start time: 03/09/2024 7:50 AM End time: 03/09/2024 7:57 AM Reason for block: surgical anesthesia Staffing Performed: anesthesiologist  Anesthesiologist: Jerrye Sharper, MD Performed by: Jerrye Sharper, MD Authorized by: Jerrye Sharper, MD   Preanesthetic Checklist Completed: patient identified, IV checked, site marked, risks and benefits discussed, surgical consent, monitors and equipment checked, pre-op evaluation and timeout performed Spinal Block Patient position: sitting Prep: DuraPrep and site prepped and draped Patient monitoring: heart rate, cardiac monitor, continuous pulse ox and blood pressure Approach: midline Location: L3-4 Injection technique: single-shot Needle Needle type: Spinocan and Tuohy  Needle gauge: 24 G Needle length: 9 cm Needle insertion depth: 8 cm Assessment Sensory level: T6 Events: CSF return Additional Notes Patient tolerated procedure well. Technically difficult due to poor positioning and MO. Multiple attempts with Spinal needle, success with Touhy and spinal through the Touhy. Adequate sensory level.

## 2024-03-10 ENCOUNTER — Encounter (HOSPITAL_COMMUNITY): Payer: Self-pay | Admitting: Orthopedic Surgery

## 2024-03-10 DIAGNOSIS — M1712 Unilateral primary osteoarthritis, left knee: Secondary | ICD-10-CM | POA: Diagnosis not present

## 2024-03-10 NOTE — TOC Transition Note (Signed)
 Transition of Care [facility]) - Discharge Note   Patient Details  Name: Candice Watkins MRN: [id number] Date of Birth: 07-08-1970  Transition of Care [facility]) CM/SW Contact:  Alfonse JONELLE Rex, RN Phone Number: 03/10/2024, 9:30 AM   Clinical Narrative:   Met with patient at bedside to review dc therapy and home equipment needs, patient confirmed OPPT-[facility] on [address] has RW, no home DME needs. MOON completed. No TOC needs     Final next level of care: OP Rehab Barriers to Discharge: No Barriers Identified   Patient Goals and CMS Choice Patient states their goals for this hospitalization and ongoing recovery are:: return home          Discharge Placement                       Discharge Plan and Services Additional resources added to the After Visit Summary for                  DME Arranged: CPM DME Agency: [employer]                  Social Drivers of Health (SDOH) Interventions SDOH Screenings   Food Insecurity: No Food Insecurity (03/09/2024)  Housing: Low Risk  (03/09/2024)  Transportation Needs: No Transportation Needs (03/09/2024)  Utilities: Not At Risk (03/09/2024)  Alcohol Screen: Low Risk  (04/19/2017)  Depression (PHQ2-9): High Risk (09/10/2022)  Tobacco Use: Low Risk  (03/09/2024)     Readmission Risk Interventions     No data to display

## 2024-03-10 NOTE — Progress Notes (Signed)
 Physical Therapy Treatment Patient Details Name: Candice Watkins MRN: [id number] DOB: Nov 26, 1970 Today's Date: 03/10/2024   History of Present Illness Pt is 53 yo female s/p L TKA on 03/09/24.  Pt with hx including but not limited to arthritis, anxiety, DM, HTN, PONV, R TKA 2017, L THA 5/25    PT Comments  Pt rating pain at 8-10/10, was premedicated and agreeable to try PT.  Performed limited exercises due to pain and focused on mobility.  Pt is able to ambulate 14' with RW and supervision.  She does have some signs of pain but not of severe pain.  Will benefit from further therapy. Plan to f/u in afternoon.     If plan is discharge home, recommend the following: A little help with walking and/or transfers;A little help with bathing/dressing/bathroom;Assistance with cooking/housework;Help with stairs or ramp for entrance   Can travel by private vehicle        Equipment Recommendations  None recommended by PT    Recommendations for Other Services       Precautions / Restrictions Precautions Precautions: Fall;Knee Restrictions LLE Weight Bearing Per Provider Order: Weight bearing as tolerated     Mobility  Bed Mobility Overal bed mobility: Needs Assistance Bed Mobility: Supine to Sit, Sit to Supine     Supine to sit: Min assist Sit to supine: Min assist   General bed mobility comments: light min A for L LE    Transfers Overall transfer level: Needs assistance Equipment used: Rolling walker (2 wheels) Transfers: Sit to/from Stand Sit to Stand: Contact guard assist           General transfer comment: STS from bed and toilet    Ambulation/Gait Ambulation/Gait assistance: Contact guard assist Gait Distance (Feet): 50 Feet Assistive device: Rolling walker (2 wheels) Gait Pattern/deviations: Step-to pattern, Decreased stride length, Antalgic, Decreased weight shift to left Gait velocity: decreased         Stairs             Wheelchair Mobility      Tilt Bed    Modified Rankin (Stroke Patients Only)       Balance Overall balance assessment: Needs assistance Sitting-balance support: No upper extremity supported Sitting balance-Leahy Scale: Good     Standing balance support: Bilateral upper extremity supported, No upper extremity supported Standing balance-Leahy Scale: Fair Standing balance comment: RW to ambulate but could stand for ADLs wtihout support                            Communication    Cognition Arousal: Alert Behavior During Therapy: WFL for tasks assessed/performed   PT - Cognitive impairments: No apparent impairments                                Cueing    Exercises Total Joint Exercises Ankle Circles/Pumps: AROM, Both, 10 reps, Supine Quad Sets: AROM, Both, 10 reps, Supine Knee Flexion: AAROM, Right, 10 reps, Seated, Limitations Knee Flexion Limitations: gentle knee flexion for ROM prior to walking Goniometric ROM: L knee 0 to 45 degrees    General Comments        Pertinent Vitals/Pain Pain Assessment Pain Assessment: 0-10 Pain Score: 8  (8/10 rest ; 10/10 walking) Pain Location: L knee Pain Descriptors / Indicators: Discomfort Pain Intervention(s): Limited activity within patient's tolerance, Monitored during session, Premedicated before session, Repositioned, Ice applied (no  signs of severe pain)    Home Living                          Prior Function            PT Goals (current goals can now be found in the care plan section) Progress towards PT goals: Progressing toward goals    Frequency    7X/week      PT Plan      Co-evaluation              AM-PAC PT 6 Clicks Mobility   Outcome Measure  Help needed turning from your back to your side while in a flat bed without using bedrails?: A Little Help needed moving from lying on your back to sitting on the side of a flat bed without using bedrails?: A Little Help needed moving to  and from a bed to a chair (including a wheelchair)?: A Little Help needed standing up from a chair using your arms (e.g., wheelchair or bedside chair)?: A Little Help needed to walk in hospital room?: A Little Help needed climbing 3-5 steps with a railing? : A Little 6 Click Score: 18    End of Session Equipment Utilized During Treatment: Gait belt Activity Tolerance: Patient limited by pain Patient left: in bed;with call bell/phone within reach;with bed alarm set Nurse Communication: Mobility status PT Visit Diagnosis: Other abnormalities of gait and mobility (R26.89);Muscle weakness (generalized) (M62.81)     Time: 8943-8883 PT Time Calculation (min) (ACUTE ONLY): 20 min  Charges:    $Gait Training: 8-22 mins PT General Charges $$ ACUTE PT VISIT: 1 Visit                     Benjiman, PT Acute Rehab [facility] [phone]    Benjiman VEAR Mulberry 03/10/2024, 12:15 PM

## 2024-03-10 NOTE — Progress Notes (Signed)
 Orthopedic Tech Progress Note Patient Details:  Joanna HILDUR BAYER 1970/06/23 [id number]  CPM Left Knee CPM Left Knee: Off Left Knee Flexion (Degrees): 60 Left Knee Extension (Degrees): 0 Additional Comments: Noted CPM set on 0-60 with orders for up to 90 degrees; with increase pain left at 60 degrees as pt able to tolerate  Post Interventions Patient Tolerated: Well Instructions Provided: Adjustment of device, Care of device  Waylan Thom Loving 03/10/2024, 5:31 PM

## 2024-03-10 NOTE — Progress Notes (Signed)
 Physical Therapy Treatment Patient Details Name: Candice Watkins MRN: [id number] DOB: 05-14-1971 Today's Date: 03/10/2024   History of Present Illness Pt is 53 yo female s/p L TKA on 03/09/24.  Pt with hx including but not limited to arthritis, anxiety, DM, HTN, PONV, R TKA 2017, L THA 5/25    PT Comments  Pt putting forth good effort and verbalizing understanding that she knows she has to mobilize despite the pain.  Continues to rate pain at 8-10/10 with rest and activity and does not feel that pain is controlled enough to return home or progress to stairs.  She was able to ambulate 13' with RW and CGA for safety.  Performed light exercises for pain control, pt request CPM at end of session which was applied and she was tolerating well.  Will continue to progress as able while hospitalized.    If plan is discharge home, recommend the following: A little help with walking and/or transfers;A little help with bathing/dressing/bathroom;Assistance with cooking/housework;Help with stairs or ramp for entrance   Can travel by private vehicle        Equipment Recommendations  None recommended by PT    Recommendations for Other Services       Precautions / Restrictions Precautions Precautions: Fall;Knee Restrictions LLE Weight Bearing Per Provider Order: Weight bearing as tolerated     Mobility  Bed Mobility Overal bed mobility: Needs Assistance Bed Mobility: Supine to Sit, Sit to Supine     Supine to sit: Min assist Sit to supine: Min assist   General bed mobility comments: light min A for L LE    Transfers Overall transfer level: Needs assistance Equipment used: Rolling walker (2 wheels) Transfers: Sit to/from Stand Sit to Stand: Contact guard assist           General transfer comment: STS from bed and toilet    Ambulation/Gait Ambulation/Gait assistance: Contact guard assist Gait Distance (Feet): 65 Feet Assistive device: Rolling walker (2 wheels) Gait  Pattern/deviations: Step-to pattern, Decreased stride length, Antalgic, Decreased weight shift to left Gait velocity: decreased     General Gait Details: some improvement in step length but still small for pain control, good RW proximity, stable wtih RW   Stairs             Wheelchair Mobility     Tilt Bed    Modified Rankin (Stroke Patients Only)       Balance Overall balance assessment: Needs assistance Sitting-balance support: No upper extremity supported Sitting balance-Leahy Scale: Good     Standing balance support: Bilateral upper extremity supported, No upper extremity supported Standing balance-Leahy Scale: Fair Standing balance comment: RW to ambulate but could stand for ADLs wtihout support                            Communication    Cognition Arousal: Alert Behavior During Therapy: WFL for tasks assessed/performed   PT - Cognitive impairments: No apparent impairments                                Cueing    Exercises Total Joint Exercises Ankle Circles/Pumps: AROM, Both, 10 reps, Supine Quad Sets: AROM, Both, 10 reps, Supine Knee Flexion: AAROM, Right, 10 reps, Seated, Limitations Knee Flexion Limitations: gentle knee flexion for ROM prior to walking Goniometric ROM: L knee 0 to 45 degrees    General Comments  Pertinent Vitals/Pain Pain Assessment Pain Assessment: 0-10 Pain Score: 8  (8/10 rest; 10/10 walk) Pain Location: L knee Pain Descriptors / Indicators: Discomfort, Sharp Pain Intervention(s): Limited activity within patient's tolerance, Monitored during session, Premedicated before session, Repositioned, Ice applied (pt does grimace with transfers and deep breaths for relaxation, but no further signs of pain)    Home Living                          Prior Function            PT Goals (current goals can now be found in the care plan section) Progress towards PT goals: Progressing toward  goals    Frequency    7X/week      PT Plan      Co-evaluation              AM-PAC PT 6 Clicks Mobility   Outcome Measure  Help needed turning from your back to your side while in a flat bed without using bedrails?: A Little Help needed moving from lying on your back to sitting on the side of a flat bed without using bedrails?: A Little Help needed moving to and from a bed to a chair (including a wheelchair)?: A Little Help needed standing up from a chair using your arms (e.g., wheelchair or bedside chair)?: A Little Help needed to walk in hospital room?: A Little Help needed climbing 3-5 steps with a railing? : A Little 6 Click Score: 18    End of Session Equipment Utilized During Treatment: Gait belt Activity Tolerance: Patient limited by pain Patient left: in bed;with call bell/phone within reach;with bed alarm set Nurse Communication: Mobility status PT Visit Diagnosis: Other abnormalities of gait and mobility (R26.89);Muscle weakness (generalized) (M62.81)     Time: 1345-1410 PT Time Calculation (min) (ACUTE ONLY): 25 min  Charges:    $Gait Training: 8-22 mins $Therapeutic Exercise: 8-22 mins PT General Charges $$ ACUTE PT VISIT: 1 Visit                     Candice, PT Acute Rehab Services [facility] [phone]    Candice Watkins 03/10/2024, 2:18 PM

## 2024-03-10 NOTE — Care Management Obs Status (Signed)
 MEDICARE OBSERVATION STATUS NOTIFICATION   Patient Details  Name: Candice Watkins MRN: [id number] Date of Birth: 07-18-1970   Medicare Observation Status Notification Given:  Yes    Alfonse JONELLE Rex, RN 03/10/2024, 10:14 AM

## 2024-03-11 ENCOUNTER — Ambulatory Visit: Admitting: Family

## 2024-03-11 DIAGNOSIS — Z96652 Presence of left artificial knee joint: Secondary | ICD-10-CM | POA: Diagnosis not present

## 2024-03-11 DIAGNOSIS — M1712 Unilateral primary osteoarthritis, left knee: Secondary | ICD-10-CM | POA: Diagnosis not present

## 2024-03-11 MED ORDER — SENNA-DOCUSATE SODIUM 8.6-50 MG PO TABS: 2.0000 | ORAL_TABLET | Freq: Every day | ORAL | 1 refills | Status: AC | PRN

## 2024-03-11 MED ORDER — ACETAMINOPHEN 500 MG PO TABS: 1000.0000 mg | ORAL_TABLET | Freq: Four times a day (QID) | ORAL | 0 refills | Status: AC | PRN

## 2024-03-11 MED ORDER — METHOCARBAMOL 750 MG PO TABS: 750.0000 mg | ORAL_TABLET | Freq: Three times a day (TID) | ORAL | 0 refills | Status: AC | PRN

## 2024-03-11 MED ORDER — ASPIRIN 81 MG PO TBEC: 81.0000 mg | DELAYED_RELEASE_TABLET | Freq: Two times a day (BID) | ORAL | 0 refills | Status: AC

## 2024-03-11 MED ORDER — OXYCODONE HCL 5 MG PO TABS: 5.0000 mg | ORAL_TABLET | ORAL | 0 refills | Status: AC | PRN

## 2024-03-11 MED ORDER — ONDANSETRON 4 MG PO TBDP: 4.0000 mg | ORAL_TABLET | Freq: Three times a day (TID) | ORAL | 0 refills | Status: AC | PRN

## 2024-03-11 NOTE — Plan of Care (Signed)

## 2024-03-11 NOTE — Progress Notes (Signed)
    Subjective: Patient reports pain as moderate. Today not as bad as yesterday when block was wearing off. Tolerating diet.  Urinating.   No CP, SOB.  Has mobilized OOB with PT/OT well. They have cleared her for d/c.  Objective:   VITALS:   Vitals:   03/11/24 0455 03/11/24 0853 03/11/24 0901 03/11/24 1300  BP: 122/74 126/66  (!) 140/94  Pulse: 70 79  81  Resp: 18   20  Temp: 98.8 F (37.1 C)   98.1 F (36.7 C)  TempSrc: Oral   Oral  SpO2: 92% 96% 96% 100%  Weight:      Height:          Latest Ref Rng & Units 03/03/2024    1:38 PM 09/10/2023    1:41 PM 06/25/2023   11:07 AM  CBC  WBC 4.0 - 10.5 K/uL 5.6  5.3  7.0   Hemoglobin 12.0 - 15.0 g/dL 88.5  88.0  87.9   Hematocrit 36.0 - 46.0 % 36.4  37.4  36.5   Platelets 150 - 400 K/uL 267  298  289       Latest Ref Rng & Units 03/03/2024    1:38 PM 09/10/2023    1:41 PM 07/15/2023    1:07 PM  BMP  Glucose 70 - 99 mg/dL 66  82  87   BUN 6 - 20 mg/dL 6  10  11    Creatinine 0.44 - 1.00 mg/dL 9.10  8.98  8.83   BUN/Creat Ratio 9 - 23   9   Sodium 135 - 145 mmol/L 140  138  139   Potassium 3.5 - 5.1 mmol/L 3.4  4.0  4.0   Chloride 98 - 111 mmol/L 104  104  100   CO2 22 - 32 mmol/L 27  26  26    Calcium  8.9 - 10.3 mg/dL 9.8  9.6  9.9    Intake/Output      10/22 0701 10/23 0700 10/23 0701 10/24 0700   P.O. 480    I.V. (mL/kg)     IV Piggyback     Total Intake(mL/kg) 480 (4.9)    Urine (mL/kg/hr)     Stool     Blood     Total Output     Net +480         Urine Occurrence 2 x 1 x   Stool Occurrence 0 x 0 x      Physical Exam: General: NAD.  Laying in bed, calm, comfortable Resp: No increased wob Cardio: regular rate and rhythm ABD soft Neurologically intact MSK Neurovascularly intact Sensation intact distally Intact pulses distally Dorsiflexion/Plantar flexion intact Incision: dressing C/D/I   Assessment: 2 Days Post-Op  S/P Procedure(s) (LRB): ARTHROPLASTY, KNEE, TOTAL (Left) by Dr. Evalene BIRCH. Beverley on  03/09/24  Principal Problem:   S/P total knee arthroplasty, left   Plan:  Advance diet Up with therapy Incentive Spirometry Elevate leg and Apply ice to knee PRN  Weightbearing: WBAT LLE Insicional and dressing care: Dressings left intact until follow-up and Reinforce dressings as needed Orthopedic device(s): CPM Showering: Keep dressing dry VTE prophylaxis: Aspirin  81mg  BID x 30 days postop, SCDs, ambulation Pain control: PRN Follow - up plan: 2 weeks Contact information:  Evalene Beverley MD, Gerard Large PA-C  Dispo: Home later today when her family member gets off work and can pick her up.     Gerard CHRISTELLA Large, PA-C Office [phone] 03/11/2024, 2:29 PM

## 2024-03-11 NOTE — Discharge Summary (Signed)
 Physician Discharge Summary  Patient ID: Candice Watkins MRN: [id number] DOB/AGE: December 28, 1970 53 y.o.  Admit date: 03/09/2024 Discharge date: 03/11/2024  Admission Diagnoses:  Left knee osteoarthritis  Discharge Diagnoses:  Principal Problem:   S/P total knee arthroplasty, left   Past Medical History:  Diagnosis Date   Anxiety    Arthritis    knees, ankles (10/11/2015)   Asthma    Depression    GERD (gastroesophageal reflux disease)    Heartburn    History of kidney stones    HLD (hyperlipidemia)    Hypertension    Hypothyroidism    Kidney stones    Migraine    monthly (10/11/2015)   PONV (postoperative nausea and vomiting) X 1   Pre-diabetes    Primary hyperparathyroidism    Seasonal allergies    Sleep apnea    No CPAP   Stomach ulcer     Surgeries: Procedure(s): ARTHROPLASTY, KNEE, TOTAL on 03/09/2024   Consultants (if any): none  Discharged Condition: Improved  Hospital Course: Candice Watkins is an 53 y.o. female who was admitted 03/09/2024 with a diagnosis of left knee osteoarthritis and went to the operating room on 03/09/2024 and underwent the above named procedures.    She was given perioperative antibiotics:  Anti-infectives (From admission, onward)    Start     Dose/Rate Route Frequency Ordered Stop   03/09/24 1400  ceFAZolin  (ANCEF ) IVPB 2g/100 mL premix        2 g 200 mL/hr over 30 Minutes Intravenous Every 6 hours 03/09/24 1124 03/09/24 2256   03/09/24 0600  ceFAZolin  (ANCEF ) IVPB 2g/100 mL premix        2 g 200 mL/hr over 30 Minutes Intravenous On call to O.R. 03/09/24 9457 03/09/24 9178     .  She was given sequential compression devices, early ambulation, and ASA for DVT prophylaxis.  She benefited maximally from the hospital stay and there were no complications.    Recent vital signs:  Vitals:   03/11/24 0901 03/11/24 1300  BP:  (!) 140/94  Pulse:  81  Resp:  20  Temp:  98.1 F (36.7 C)  SpO2: 96% 100%     Recent laboratory studies:  Lab Results  Component Value Date   HGB 11.4 (L) 03/03/2024   HGB 11.9 (L) 09/10/2023   HGB 12.0 06/25/2023   Lab Results  Component Value Date   WBC 5.6 03/03/2024   PLT 267 03/03/2024   Lab Results  Component Value Date   INR 1.05 11/23/2015   Lab Results  Component Value Date   NA 140 03/03/2024   K 3.4 (L) 03/03/2024   CL 104 03/03/2024   CO2 27 03/03/2024   BUN 6 03/03/2024   CREATININE 0.89 03/03/2024   GLUCOSE 66 (L) 03/03/2024    Discharge Medications:   Allergies as of 03/11/2024       Reactions   Ibuprofen  Other (See Comments)   History of Stomach Ulcers   Metronidazole Swelling, Other (See Comments)   EDEMA   Phenyltoloxamine-acetaminophen  Hives   Other Nausea And Vomiting   Cogesic   Septra [sulfamethoxazole-trimethoprim] Nausea And Vomiting        Medication List     TAKE these medications    acetaminophen  500 MG tablet Commonly known as: TYLENOL  Take 2 tablets (1,000 mg total) by mouth every 6 (six) hours as needed for mild pain (pain score 1-3) or moderate pain (pain score 4-6).   amoxicillin  500 MG capsule Commonly known as:  AMOXIL  Take 2,000 mg by mouth See admin instructions. Take 4 capsules (2000 mg) by mouth 1 hour prior to dental appointments.   aspirin  EC 81 MG tablet Take 1 tablet (81 mg total) by mouth 2 (two) times daily. To prevent blood clots for 30 days after surgery.   Azelastine HCl 137 MCG/SPRAY Soln USE 1 SPRAY INTO EACH NOSTRIL TWICE A DAY What changed: See the new instructions.   budesonide-formoterol 80-4.5 MCG/ACT inhaler Commonly known as: SYMBICORT TAKE 2 PUFFS BY MOUTH TWICE A DAY   cetirizine 10 MG tablet Commonly known as: ZYRTEC Take 10 mg by mouth daily in the afternoon.   clonazePAM  0.5 MG tablet Commonly known as: KLONOPIN  TAKE 1/2 - 1 TABLET (0.25 - 0.5 MG TOTAL) BY MOUTH TWICE A DAY AS NEEDED FOR ANXIETY   COLLAGEN PO Take 2 each by mouth in the morning.  W/biotin (Gummiest)   levothyroxine  112 MCG tablet Commonly known as: SYNTHROID  TAKE 1 TABLET BY MOUTH EVERY MORNING AT LEAST 30 MINUTES BEFORE FOOD OR OTHER MEDS.   meloxicam  15 MG tablet Commonly known as: MOBIC  Take 1 tablet (15 mg total) by mouth daily. For 2 weeks for pain and inflammation. Then take as needed   methocarbamol  750 MG tablet Commonly known as: ROBAXIN  Take 1 tablet (750 mg total) by mouth every 8 (eight) hours as needed for muscle spasms.   metoprolol  succinate 50 MG 24 hr tablet Commonly known as: TOPROL -XL TAKE 1 TABLET BY MOUTH EVERY DAY What changed: when to take this   Mounjaro  10 MG/0.5ML Pen Generic drug: tirzepatide  Inject 10 mg into the skin once a week. What changed: when to take this   multivitamin with minerals Tabs tablet Take 1 tablet by mouth daily after breakfast.   ondansetron  4 MG disintegrating tablet Commonly known as: ZOFRAN -ODT Take 1 tablet (4 mg total) by mouth every 8 (eight) hours as needed for nausea or vomiting.   OVER THE COUNTER MEDICATION Take 2 capsules by mouth daily after breakfast. Sea Moss Black Seed Oil Ashwagandha Ginger Capsules   oxyCODONE  5 MG immediate release tablet Commonly known as: Roxicodone  Take 1 tablet (5 mg total) by mouth every 4 (four) hours as needed for severe pain (pain score 7-10). in knee after surgery   rosuvastatin  10 MG tablet Commonly known as: CRESTOR  TAKE 1 TABLET BY MOUTH EVERY DAY What changed: when to take this   sennosides-docusate sodium  8.6-50 MG tablet Commonly known as: SENOKOT-S Take 2 tablets by mouth daily as needed for constipation (while taking narcotics).   traZODone  50 MG tablet Commonly known as: DESYREL  TAKE 1 TABLET BY MOUTH EVERYDAY AT BEDTIME   Trintellix  20 MG Tabs tablet Generic drug: vortioxetine  HBr Take 20 mg by mouth in the morning.   valsartan -hydrochlorothiazide  160-12.5 MG tablet Commonly known as: DIOVAN -HCT TAKE 1 TABLET BY MOUTH EVERY DAY                Discharge Care Instructions  (From admission, onward)           Start     Ordered   03/11/24 0000  Weight bearing as tolerated        03/11/24 1435            Diagnostic Studies: DG Knee Left Port Result Date: 03/09/2024 CLINICAL DATA:  Status post left knee arthroplasty. EXAM: PORTABLE LEFT KNEE - 1-2 VIEW COMPARISON:  None Available. FINDINGS: Left knee arthroplasty in expected alignment. No periprosthetic lucency or fracture. Recent postsurgical change includes  air and edema in the soft tissues and joint space. IMPRESSION: Left knee arthroplasty without immediate postoperative complication. Electronically Signed   By: Andrea Gasman M.D.   On: 03/09/2024 13:32    Disposition: Discharge disposition: 01-Home or Self Care       Discharge Instructions     CPM   Complete by: As directed    Continuous passive motion machine (CPM):      Use the CPM from 0 to 90 degrees for 4-6 hours per day.      You may break it up into 2 or 3 sessions per day.      Use CPM for 3 weeks or until you are told to stop.   Call MD / Call 911   Complete by: As directed    If you experience chest pain or shortness of breath, CALL 911 and be transported to the hospital emergency room.  If you develope a fever above 101 F, pus (white drainage) or increased drainage or redness at the wound, or calf pain, call your surgeon's office.   Diet - low sodium heart healthy   Complete by: As directed    Discharge instructions   Complete by: As directed    You may bear weight as tolerated. Keep your dressing on and dry until follow up. Take medicine to prevent blood clots as directed. Take pain medicine as needed with the goal of transitioning to over the counter medicines. It is okay to take 2 tablets of your narcotic medicine instead of just 1 tablet in the first 1-2 days after surgery when pain is at the worst. Do not continue to do this for multiple days at a time beyond that though.     INSTRUCTIONS AFTER JOINT REPLACEMENT   Remove items at home which could result in a fall. This includes throw rugs or furniture in walking pathways ICE to the affected joint every three hours while awake for 30 minutes at a time, for at least the first 3-5 days, and then as needed for pain and swelling.  Continue to use ice for pain and swelling. You may notice swelling that will progress down to the foot and ankle.  This is normal after surgery.  Elevate your leg when you are not up walking on it.   Continue to use the breathing machine you got in the hospital (incentive spirometer) which will help keep your temperature down.  It is common for your temperature to cycle up and down following surgery, especially at night when you are not up moving around and exerting yourself.  The breathing machine keeps your lungs expanded and your temperature down.   DIET:  As you were doing prior to hospitalization, we recommend a well-balanced diet.  DRESSING / WOUND CARE / SHOWERING  You may shower 3 days after surgery, but keep the wounds dry during showering.  You may use an occlusive plastic wrap (Press'n Seal for example) with blue painter's tape at edges, NO SOAKING/SUBMERGING IN THE BATHTUB.  If the bandage gets wet, call the office.   ACTIVITY  Increase activity slowly as tolerated, but follow the weight bearing instructions below.   No driving for 6 weeks or until further direction given by your physician.  You cannot drive while taking narcotics.  No lifting or carrying greater than 10 lbs. until further directed by your surgeon. Avoid periods of inactivity such as sitting longer than an hour when not asleep. This helps prevent blood clots.  You may return  to work once you are authorized by your doctor.    WEIGHT BEARING   Weight bearing as tolerated with assist device (walker, cane, etc) as directed, use it as long as suggested by your surgeon or therapist, typically at least 4-6  weeks.   EXERCISES  Results after joint replacement surgery are often greatly improved when you follow the exercise, range of motion and muscle strengthening exercises prescribed by your doctor. Safety measures are also important to protect the joint from further injury. Any time any of these exercises cause you to have increased pain or swelling, decrease what you are doing until you are comfortable again and then slowly increase them. If you have problems or questions, call your caregiver or physical therapist for advice.   Rehabilitation is important following a joint replacement. After just a few days of immobilization, the muscles of the leg can become weakened and shrink (atrophy).  These exercises are designed to build up the tone and strength of the thigh and leg muscles and to improve motion. Often times heat used for twenty to thirty minutes before working out will loosen up your tissues and help with improving the range of motion but do not use heat for the first two weeks following surgery (sometimes heat can increase post-operative swelling).   These exercises can be done on a training (exercise) mat, on the floor, on a table or on a bed. Use whatever works the best and is most comfortable for you.    Use music or television while you are exercising so that the exercises are a pleasant break in your day. This will make your life better with the exercises acting as a break in your routine that you can look forward to.   Perform all exercises about fifteen times, three times per day or as directed.  You should exercise both the operative leg and the other leg as well.  Exercises include:   Quad Sets - Tighten up the muscle on the front of the thigh (Quad) and hold for 5-10 seconds.   Straight Leg Raises - With your knee straight (if you were given a brace, keep it on), lift the leg to 60 degrees, hold for 3 seconds, and slowly lower the leg.  Perform this exercise against resistance later as  your leg gets stronger.  Leg Slides: Lying on your back, slowly slide your foot toward your buttocks, bending your knee up off the floor (only go as far as is comfortable). Then slowly slide your foot back down until your leg is flat on the floor again.  Angel Wings: Lying on your back spread your legs to the side as far apart as you can without causing discomfort.  Hamstring Strength:  Lying on your back, push your heel against the floor with your leg straight by tightening up the muscles of your buttocks.  Repeat, but this time bend your knee to a comfortable angle, and push your heel against the floor.  You may put a pillow under the heel to make it more comfortable if necessary.   A rehabilitation program following joint replacement surgery can speed recovery and prevent re-injury in the future due to weakened muscles. Contact your doctor or a physical therapist for more information on knee rehabilitation.    CONSTIPATION  Constipation is defined medically as fewer than three stools per week and severe constipation as less than one stool per week.  Even if you have a regular bowel pattern at home, your normal regimen  is likely to be disrupted due to multiple reasons following surgery.  Combination of anesthesia, postoperative narcotics, change in appetite and fluid intake all can affect your bowels.   YOU MUST use at least one of the following options; they are listed in order of increasing strength to get the job done.  They are all available over the counter, and you may need to use some, POSSIBLY even all of these options:    Drink plenty of fluids (prune juice may be helpful) and high fiber foods Colace 100 mg by mouth twice a day  Senokot for constipation as directed and as needed Dulcolax (bisacodyl ), take with full glass of water  Miralax  (polyethylene glycol) once or twice a day as needed.  If you have tried all these things and are unable to have a bowel movement in the first 3-4 days  after surgery call either your surgeon or your primary doctor.    If you experience loose stools or diarrhea, hold the medications until you stool forms back up.  If your symptoms do not get better within 1 week or if they get worse, check with your doctor.  If you experience the worst abdominal pain ever or develop nausea or vomiting, please contact the office immediately for further recommendations for treatment.   ITCHING:  If you experience itching with your medications, try taking only a single pain pill, or even half a pain pill at a time.  You can also use Benadryl  over the counter for itching or also to help with sleep.   TED HOSE STOCKINGS:  Use stockings on both legs until for at least 2 weeks or as directed by physician office. They may be removed at night for sleeping.  MEDICATIONS:  See your medication summary on the After Visit Summary that nursing will review with you.  You may have some home medications which will be placed on hold until you complete the course of blood thinner medication.  It is important for you to complete the blood thinner medication as prescribed.  Take medicines as prescribed.   You have several different medicines that work in different ways. - Tylenol  is for mild to moderate pain. Try to take this medicine before turning to your narcotic medicines.  - Meloxicam  is to reduce pain / inflammation - Robaxin  is for muscle spasms. This medicine can make you drowsy. - Oxycodone  is a narcotic pain medicine.  Take this for severe pain. This medicine can be dehydrating / constipating. - Zofran  is for nausea and vomiting. - Senokot is for constipation prevention while taking pain medicine.  - Aspirin  is to prevent blood clots after surgery. YOU MUST TAKE THIS MEDICINE!!  PRECAUTIONS:  If you experience chest pain or shortness of breath - call 911 immediately for transfer to the hospital emergency department.   If you develop a fever greater that 101 F, purulent  drainage from wound, increased redness or drainage from wound, foul odor from the wound/dressing, or calf pain - CONTACT YOUR SURGEON.                                                   FOLLOW-UP APPOINTMENTS:  If you do not already have a post-op appointment, please call the office [phone] for an appointment to be seen by Dr. Beverley in 2 weeks.   OTHER INSTRUCTIONS:  MAKE SURE YOU:  Understand these instructions.  Get help right away if you are not doing well or get worse.    Thank you for letting us  be a part of your medical care team.  It is a privilege we respect greatly.  We hope these instructions will help you stay on track for a fast and full recovery!   Do not put a pillow under the knee. Place it under the heel.   Complete by: As directed    Driving restrictions   Complete by: As directed    No driving for 2-6 weeks   Post-operative opioid taper instructions:   Complete by: As directed    POST-OPERATIVE OPIOID TAPER INSTRUCTIONS: It is important to wean off of your opioid medication as soon as possible. If you do not need pain medication after your surgery it is ok to stop day one. Opioids include: Codeine, Hydrocodone(Norco, Vicodin), Oxycodone (Percocet, oxycontin ) and hydromorphone  amongst others.  Long term and even short term use of opiods can cause: Increased pain response Dependence Constipation Depression Respiratory depression And more.  Withdrawal symptoms can include Flu like symptoms Nausea, vomiting And more Techniques to manage these symptoms Hydrate well Eat regular healthy meals Stay active Use relaxation techniques(deep breathing, meditating, yoga) Do Not substitute Alcohol to help with tapering If you have been on opioids for less than two weeks and do not have pain than it is ok to stop all together.  Plan to wean off of opioids This plan should start within one week post op of your joint replacement. Maintain the same interval or time  between taking each dose and first decrease the dose.  Cut the total daily intake of opioids by one tablet each day Next start to increase the time between doses. The last dose that should be eliminated is the evening dose.      TED hose   Complete by: As directed    Use stockings (TED hose) for 2 weeks on left leg(s).  You may remove them at night for sleeping.   Weight bearing as tolerated   Complete by: As directed         Follow-up Information     Beverley Evalene BIRCH, MD. Go on 03/24/2024.   Specialty: Orthopedic Surgery Why: your appointment is scheduled for 4:15. Contact information: [address] [place] [address] [phone]         Davene PEL- [address] Go on 03/12/2024.   Why: your outpatient physical therapy is scheduled. they will call you with a time Contact information: [phone]                 Signed: Gerard CHRISTELLA Large PA-C Office [phone] 03/11/2024, 2:37 PM

## 2024-03-11 NOTE — Progress Notes (Signed)
 Physical Therapy Treatment Patient Details Name: Candice Watkins MRN: [id number] DOB: 19-Apr-1971 Today's Date: 03/11/2024   History of Present Illness Pt is 53 yo female s/p L TKA on 03/09/24.  Pt with hx including but not limited to arthritis, anxiety, DM, HTN, PONV, R TKA 2017, L THA 5/25    PT Comments  Pt cooperative and progressing well with mobility including up to ambulate increased distance in hall, negotiated stairs, and performed HEP with written instruction provided and reviewed.  Pt hopeful for dc home this date but states has not seen Physician this date.    If plan is discharge home, recommend the following: A little help with walking and/or transfers;A little help with bathing/dressing/bathroom;Assistance with cooking/housework;Help with stairs or ramp for entrance   Can travel by private vehicle        Equipment Recommendations  None recommended by PT    Recommendations for Other Services       Precautions / Restrictions Precautions Precautions: Fall;Knee Restrictions Weight Bearing Restrictions Per Provider Order: No LLE Weight Bearing Per Provider Order: Weight bearing as tolerated     Mobility  Bed Mobility Overal bed mobility: Needs Assistance Bed Mobility: Supine to Sit     Supine to sit: Supervision     General bed mobility comments: Pt self assisting L LE with UEs    Transfers Overall transfer level: Needs assistance Equipment used: Rolling walker (2 wheels) Transfers: Sit to/from Stand Sit to Stand: Contact guard assist, Supervision           General transfer comment: min cues for LE management and use of UEs to self assist    Ambulation/Gait Ambulation/Gait assistance: Contact guard assist, Supervision Gait Distance (Feet): 85 Feet Assistive device: Rolling walker (2 wheels) Gait Pattern/deviations: Step-to pattern, Decreased step length - right, Decreased step length - left, Shuffle, Trunk flexed Gait velocity: decreased      General Gait Details: Min cues for posture, position from RW and initial sequence.  Pt progressed to reciprocal gait   Stairs Stairs: Yes Stairs assistance: Min assist Stair Management: One rail Right, Step to pattern, Forwards, With cane Number of Stairs: 5 General stair comments: 2+3 stairs with cues for sequence and foot/cane placement   Wheelchair Mobility     Tilt Bed    Modified Rankin (Stroke Patients Only)       Balance Overall balance assessment: Needs assistance Sitting-balance support: No upper extremity supported Sitting balance-Leahy Scale: Good     Standing balance support: Bilateral upper extremity supported, No upper extremity supported Standing balance-Leahy Scale: Fair Standing balance comment: RW to ambulate but could stand for ADLs wtihout support                            Communication Communication Communication: No apparent difficulties  Cognition Arousal: Alert Behavior During Therapy: WFL for tasks assessed/performed   PT - Cognitive impairments: No apparent impairments                         Following commands: Intact      Cueing Cueing Techniques: Verbal cues  Exercises Total Joint Exercises Ankle Circles/Pumps: AROM, Both, Supine, 15 reps Quad Sets: AROM, Both, 10 reps, Supine Heel Slides: AAROM, Left, 15 reps, Supine Straight Leg Raises: AAROM, AROM, 15 reps, Supine Long Arc Quad: AAROM, AROM, Left, 10 reps, Seated Knee Flexion: AAROM, Right, 10 reps, Seated, Limitations Goniometric ROM: L knee AAROM -5 -  50    General Comments        Pertinent Vitals/Pain Pain Assessment Pain Assessment: 0-10 Pain Score: 7  Pain Location: L knee Pain Descriptors / Indicators: Discomfort, Sharp Pain Intervention(s): Limited activity within patient's tolerance, Monitored during session, Premedicated before session, Ice applied    Home Living                          Prior Function            PT  Goals (current goals can now be found in the care plan section) Acute Rehab PT Goals Patient Stated Goal: return home PT Goal Formulation: With patient/family Time For Goal Achievement: 03/23/24 Potential to Achieve Goals: Good Progress towards PT goals: Progressing toward goals    Frequency    7X/week      PT Plan      Co-evaluation              AM-PAC PT 6 Clicks Mobility   Outcome Measure  Help needed turning from your back to your side while in a flat bed without using bedrails?: A Little Help needed moving from lying on your back to sitting on the side of a flat bed without using bedrails?: A Little Help needed moving to and from a bed to a chair (including a wheelchair)?: A Little Help needed standing up from a chair using your arms (e.g., wheelchair or bedside chair)?: A Little Help needed to walk in hospital room?: A Little Help needed climbing 3-5 steps with a railing? : A Little 6 Click Score: 18    End of Session Equipment Utilized During Treatment: Gait belt Activity Tolerance: Patient limited by pain Patient left: in chair;with call bell/phone within reach;with chair alarm set Nurse Communication: Mobility status PT Visit Diagnosis: Other abnormalities of gait and mobility (R26.89);Muscle weakness (generalized) (M62.81)     Time: 8980-8940 PT Time Calculation (min) (ACUTE ONLY): 40 min  Charges:    $Gait Training: 8-22 mins $Therapeutic Exercise: 8-22 mins $Therapeutic Activity: 8-22 mins PT General Charges $$ ACUTE PT VISIT: 1 Visit                     [facility] PT Acute Rehabilitation Services Office [phone]    Bassheva Flury 03/11/2024, 12:32 PM

## 2024-03-12 ENCOUNTER — Encounter: Admitting: Physical Therapy

## 2024-03-15 ENCOUNTER — Other Ambulatory Visit: Payer: Self-pay | Admitting: Family

## 2024-03-15 DIAGNOSIS — F332 Major depressive disorder, recurrent severe without psychotic features: Secondary | ICD-10-CM | POA: Diagnosis not present

## 2024-03-16 ENCOUNTER — Ambulatory Visit: Admitting: Physical Therapy

## 2024-03-18 ENCOUNTER — Encounter: Admitting: Physical Therapy

## 2024-03-22 DIAGNOSIS — R2242 Localized swelling, mass and lump, left lower limb: Secondary | ICD-10-CM | POA: Diagnosis not present

## 2024-03-22 DIAGNOSIS — M25579 Pain in unspecified ankle and joints of unspecified foot: Secondary | ICD-10-CM | POA: Insufficient documentation

## 2024-03-22 DIAGNOSIS — M25772 Osteophyte, left ankle: Secondary | ICD-10-CM | POA: Diagnosis not present

## 2024-03-23 ENCOUNTER — Encounter: Admitting: Physical Therapy

## 2024-03-24 ENCOUNTER — Ambulatory Visit: Admitting: Family

## 2024-03-24 ENCOUNTER — Encounter: Payer: Self-pay | Admitting: Family

## 2024-03-24 VITALS — BP 121/78 | HR 84 | Ht 64.0 in | Wt 221.0 lb

## 2024-03-24 DIAGNOSIS — I10 Essential (primary) hypertension: Secondary | ICD-10-CM | POA: Diagnosis not present

## 2024-03-24 DIAGNOSIS — Z96642 Presence of left artificial hip joint: Secondary | ICD-10-CM

## 2024-03-24 DIAGNOSIS — R262 Difficulty in walking, not elsewhere classified: Secondary | ICD-10-CM | POA: Diagnosis not present

## 2024-03-24 DIAGNOSIS — E66812 Obesity, class 2: Secondary | ICD-10-CM | POA: Insufficient documentation

## 2024-03-24 DIAGNOSIS — F332 Major depressive disorder, recurrent severe without psychotic features: Secondary | ICD-10-CM | POA: Diagnosis not present

## 2024-03-24 DIAGNOSIS — R7303 Prediabetes: Secondary | ICD-10-CM | POA: Diagnosis not present

## 2024-03-24 DIAGNOSIS — Z23 Encounter for immunization: Secondary | ICD-10-CM | POA: Insufficient documentation

## 2024-03-24 DIAGNOSIS — E039 Hypothyroidism, unspecified: Secondary | ICD-10-CM | POA: Diagnosis not present

## 2024-03-24 DIAGNOSIS — E559 Vitamin D deficiency, unspecified: Secondary | ICD-10-CM

## 2024-03-24 DIAGNOSIS — M2550 Pain in unspecified joint: Secondary | ICD-10-CM | POA: Diagnosis not present

## 2024-03-24 DIAGNOSIS — N1831 Chronic kidney disease, stage 3a: Secondary | ICD-10-CM | POA: Diagnosis not present

## 2024-03-24 DIAGNOSIS — M1712 Unilateral primary osteoarthritis, left knee: Secondary | ICD-10-CM | POA: Diagnosis not present

## 2024-03-24 DIAGNOSIS — F419 Anxiety disorder, unspecified: Secondary | ICD-10-CM | POA: Diagnosis not present

## 2024-03-24 DIAGNOSIS — E782 Mixed hyperlipidemia: Secondary | ICD-10-CM | POA: Diagnosis not present

## 2024-03-24 DIAGNOSIS — E538 Deficiency of other specified B group vitamins: Secondary | ICD-10-CM

## 2024-03-24 MED ORDER — TRAZODONE HCL 50 MG PO TABS: 50.0000 mg | ORAL_TABLET | Freq: Every evening | ORAL | 1 refills | Status: AC | PRN

## 2024-03-24 MED ORDER — CLONAZEPAM 0.5 MG PO TABS: 0.5000 mg | ORAL_TABLET | Freq: Two times a day (BID) | ORAL | 0 refills | Status: DC | PRN

## 2024-03-24 NOTE — Assessment & Plan Note (Addendum)
 Flu shot given

## 2024-03-24 NOTE — Assessment & Plan Note (Addendum)
-   Continue taking medications as prescribed. - FU with orthopedics as scheduled. - Continue performing PT exercises as recommended.

## 2024-03-24 NOTE — Assessment & Plan Note (Addendum)
-   Continue medications as prescribed. FU if symptoms worsen or she has thoughts of suicide ideation, self harm or harm of others. - Discussed healthy coping mechanisms and stress reduction.  - Keep specialists appointments as scheduled - PMDP reviewed during today's visit.

## 2024-03-24 NOTE — Assessment & Plan Note (Addendum)
-   Continue healthy diet and exercise as tolerated. - Continue medications as prescribed. - Check labs today

## 2024-03-24 NOTE — Assessment & Plan Note (Addendum)
-   Check labs today - Supplementation recommended based off lab results and will notify patient at that time

## 2024-03-24 NOTE — Progress Notes (Signed)
 Established Patient Office Visit  Subjective:  Patient ID: Candice Watkins, female    DOB: [birth year]  Age: 53 y.o. MRN: [id number]  Chief Complaint  Patient presents with   Follow-up    Follow up     Patient is here today for her 5 month follow up.  She has been feeling fairly well since last appointment.   She does have additional concerns to discuss today. She was recently hospitalized 03/09/24-03/11/24 for knee replacement. She has seen orthopedics to FU since discharge and she has begun physical therapy. Patient reports knee pain is still severe enough that she is needing it in the afternoon and and bedtime after doing her exercises. Orthopedic specialists is managing her pain medication.  She has also recently seen nephrology who is managing her CKD 3a. They are monitoring her and not making any medication changes at this time. Blood pressure has improved since her last visit. She has gained some weight but is on reduce activity restrictions at this time due to knee replacement.  She reports seeing her psychiatrist once a month and a therapist frequently at this time she feels like she has been under more stress but is doing well otherwise on her medications.  Labs are due today.  She needs refills.   I have reviewed her active problem list, medication list, allergies, family history, social history, health maintenance, notes from last encounter, lab results for her appointment today.    Patient would like a flu shot today.     No other concerns at this time.   Past Medical History:  Diagnosis Date   Anxiety    Arthritis    knees, ankles (10/11/2015)   Asthma    Depression    GERD (gastroesophageal reflux disease)    Heartburn    History of kidney stones    HLD (hyperlipidemia)    Hypertension    Hypothyroidism    Kidney stones    Migraine    monthly (10/11/2015)   PONV (postoperative nausea and vomiting) X 1   Pre-diabetes    Primary  hyperparathyroidism    Seasonal allergies    Sleep apnea    No CPAP   Stomach ulcer     Past Surgical History:  Procedure Laterality Date   BREAST BIOPSY Bilateral    neg- core   CESAREAN SECTION  05/20/1998   COLONOSCOPY WITH PROPOFOL  N/A 05/02/2022   Procedure: COLONOSCOPY WITH PROPOFOL ;  Surgeon: Unk Corinn Skiff, MD;  Location: [facility] ENDOSCOPY;  Service: Gastroenterology;  Laterality: N/A;   COLONOSCOPY WITH PROPOFOL  N/A 07/22/2022   Procedure: COLONOSCOPY WITH PROPOFOL ;  Surgeon: Unk Corinn Skiff, MD;  Location: [facility] ENDOSCOPY;  Service: Gastroenterology;  Laterality: N/A;   ESOPHAGOGASTRODUODENOSCOPY (EGD) WITH PROPOFOL  N/A 05/02/2022   Procedure: ESOPHAGOGASTRODUODENOSCOPY (EGD) WITH PROPOFOL ;  Surgeon: Unk Corinn Skiff, MD;  Location: [facility] ENDOSCOPY;  Service: Gastroenterology;  Laterality: N/A;   EXAM UNDER ANESTHESIA WITH MANIPULATION OF KNEE Right 01/2016   KNEE ARTHROSCOPY Right    THYROID  SURGERY     removed tumor   TOTAL HIP ARTHROPLASTY Left 09/23/2023   Procedure: ARTHROPLASTY, HIP, TOTAL, ANTERIOR APPROACH;  Surgeon: Beverley Evalene BIRCH, MD;  Location: [facility] ORS;  Service: Orthopedics;  Laterality: Left;   TOTAL KNEE ARTHROPLASTY Right 10/11/2015   Procedure: TOTAL KNEE ARTHROPLASTY;  Surgeon: Toribio JULIANNA Beverley, MD;  Location: [facility] OR;  Service: Orthopedics;  Laterality: Right;   TOTAL KNEE ARTHROPLASTY Left 03/09/2024   Procedure: ARTHROPLASTY, KNEE, TOTAL;  Surgeon: Beverley Evalene BIRCH, MD;  Location: [facility] ORS;  Service: Orthopedics;  Laterality: Left;   TUBAL LIGATION     VAGINAL HYSTERECTOMY      Social History   Socioeconomic History   Marital status: Single    Spouse name: Not on file   Number of children: Not on file   Years of education: Not on file   Highest education level: Not on file  Occupational History   Not on file  Tobacco Use   Smoking status: Never   Smokeless tobacco: Never  Vaping Use   Vaping status: Never Used  Substance and Sexual  Activity   Alcohol use: Not Currently    Comment: occ   Drug use: No   Sexual activity: Yes    Birth control/protection: None    Comment: hysterectomy  Other Topics Concern   Not on file  Social History Narrative   Not on file   Social Drivers of Health   Financial Resource Strain: Not on file  Food Insecurity: No Food Insecurity (03/09/2024)   Hunger Vital Sign    Worried About Running Out of Food in the Last Year: Never true    Ran Out of Food in the Last Year: Never true  Transportation Needs: No Transportation Needs (03/09/2024)   PRAPARE - Administrator, Civil Service (Medical): No    Lack of Transportation (Non-Medical): No  Physical Activity: Not on file  Stress: Not on file  Social Connections: Not on file  Intimate Partner Violence: Not At Risk (03/09/2024)   Humiliation, Afraid, Rape, and Kick questionnaire    Fear of Current or Ex-Partner: No    Emotionally Abused: No    Physically Abused: No    Sexually Abused: No    Family History  Problem Relation Age of Onset   Breast cancer Neg Hx    Kidney disease Neg Hx     Allergies  Allergen Reactions   Ibuprofen  Other (See Comments)    History of Stomach Ulcers   Metronidazole Swelling and Other (See Comments)    EDEMA   Phenyltoloxamine-Acetaminophen  Hives   Other Nausea And Vomiting    Cogesic   Septra [Sulfamethoxazole-Trimethoprim] Nausea And Vomiting    Review of Systems  Constitutional:  Negative for malaise/fatigue.  HENT: Negative.    Eyes:  Negative for blurred vision and pain.  Respiratory:  Negative for cough and shortness of breath.   Cardiovascular:  Negative for chest pain, palpitations, claudication and leg swelling.  Gastrointestinal:  Negative for abdominal pain, blood in stool, constipation, diarrhea, nausea and vomiting.  Genitourinary:  Negative for dysuria, frequency and urgency.  Musculoskeletal:  Positive for joint pain (knee replacment 2 weeks ago).  Skin: Negative.    Neurological:  Negative for dizziness, tingling, sensory change and headaches.  Endo/Heme/Allergies: Negative.   Psychiatric/Behavioral:  Positive for depression. The patient is nervous/anxious and has insomnia.        Objective:   BP 121/78   Pulse 84   Ht 5' 4 (1.626 m)   Wt 221 lb (100.2 kg)   SpO2 98%   BMI 37.93 kg/m   Vitals:   03/24/24 1308  BP: 121/78  Pulse: 84  Height: 5' 4 (1.626 m)  Weight: 221 lb (100.2 kg)  SpO2: 98%  BMI (Calculated): 37.92    Physical Exam Vitals and nursing note reviewed.  Constitutional:      Appearance: Normal appearance.  HENT:     Head: Normocephalic.  Eyes:     Extraocular Movements: Extraocular movements  intact.     Pupils: Pupils are equal, round, and reactive to light.  Cardiovascular:     Rate and Rhythm: Normal rate and regular rhythm.     Pulses: Normal pulses.     Heart sounds: Normal heart sounds. No murmur heard. Pulmonary:     Effort: Pulmonary effort is normal. No respiratory distress.     Breath sounds: Normal breath sounds.  Abdominal:     General: There is no distension.     Tenderness: There is no abdominal tenderness.  Musculoskeletal:        General: No tenderness. Normal range of motion.     Cervical back: Normal range of motion and neck supple.     Right lower leg: No edema.     Left lower leg: No edema.  Skin:    General: Skin is warm and dry.     Coloration: Skin is not jaundiced.     Findings: No erythema.  Neurological:     General: No focal deficit present.     Mental Status: She is alert and oriented to person, place, and time.     Gait: Gait abnormal.     Comments: Patient walking with cane due to recent knee replacement surgery  Psychiatric:        Mood and Affect: Mood normal.        Speech: Speech normal.        Behavior: Behavior is cooperative.        Cognition and Memory: Memory is not impaired.      No results found for any visits on 03/24/24.  Recent Results (from the  past 2160 hours)  Basic metabolic panel per protocol     Status: Abnormal   Collection Time: 03/03/24  1:38 PM  Result Value Ref Range   Sodium 140 135 - 145 mmol/L   Potassium 3.4 (L) 3.5 - 5.1 mmol/L   Chloride 104 98 - 111 mmol/L   CO2 27 22 - 32 mmol/L   Glucose, Bld 66 (L) 70 - 99 mg/dL    Comment: Glucose reference range applies only to samples taken after fasting for at least 8 hours.   BUN 6 6 - 20 mg/dL   Creatinine, Ser 9.10 0.44 - 1.00 mg/dL   Calcium  9.8 8.9 - 10.3 mg/dL   GFR, Estimated >39 >39 mL/min    Comment: (NOTE) Calculated using the CKD-EPI Creatinine Equation (2021)    Anion gap 10 5 - 15    Comment: Performed at [facility], [address], [place], [place] [address]  CBC per protocol     Status: Abnormal   Collection Time: 03/03/24  1:38 PM  Result Value Ref Range   WBC 5.6 4.0 - 10.5 K/uL   RBC 4.07 3.87 - 5.11 MIL/uL   Hemoglobin 11.4 (L) 12.0 - 15.0 g/dL   HCT 63.5 63.9 - 53.9 %   MCV 89.4 80.0 - 100.0 fL   MCH 28.0 26.0 - 34.0 pg   MCHC 31.3 30.0 - 36.0 g/dL   RDW 86.3 88.4 - 84.4 %   Platelets 267 150 - 400 K/uL   nRBC 0.0 0.0 - 0.2 %    Comment: Performed at [facility], [address] [place], [place] [address]  Surgical pcr screen     Status: None   Collection Time: 03/03/24  1:53 PM   Specimen: Nasal Mucosa; Nasal Swab  Result Value Ref Range   MRSA, PCR NEGATIVE NEGATIVE  Staphylococcus aureus NEGATIVE NEGATIVE    Comment: (NOTE) The Xpert SA Assay (FDA approved for NASAL specimens in patients 26 years of age and older), is one component of a comprehensive surveillance program. It is not intended to diagnose infection nor to guide or monitor treatment. Performed at [facility], [address] [place], [place] [address]        Assessment & Plan Essential hypertension, benign Class 2 severe obesity due to excess calories with serious comorbidity in adult, unspecified  BMI Obesity, morbid (HCC) Prediabetes Mixed hyperlipidemia CKD stage 3a, GFR 45-59 ml/min (HCC) - Continue healthy diet and exercise as tolerated. - Continue medications as prescribed. - Check labs today   Polyarthralgia Status post left hip replacement - Continue taking medications as prescribed. - FU with orthopedics as scheduled. - Continue performing PT exercises as recommended.  B12 deficiency due to diet Vitamin D  deficiency, unspecified Hypothyroidism (acquired) - Check labs today - Supplementation recommended based off lab results and will notify patient at that time  MDD (major depressive disorder), recurrent severe, without psychosis (HCC) Anxiety - Continue medications as prescribed. FU if symptoms worsen or she has thoughts of suicide ideation, self harm or harm of others. - Discussed healthy coping mechanisms and stress reduction.  - Keep specialists appointments as scheduled - PMDP reviewed during today's visit. Needs flu shot - Flu shot given    Return in about 4 weeks (around 04/21/2024) for medicare AWV.   Total time spent: 25 minutes  Oddis DELENA Cain, FNP  03/24/2024   This document may have been prepared by [facility] Voice Recognition software and as such may include unintentional dictation errors.

## 2024-03-25 ENCOUNTER — Encounter: Admitting: Physical Therapy

## 2024-03-25 ENCOUNTER — Ambulatory Visit: Payer: Self-pay

## 2024-03-25 LAB — CMP14+EGFR
ALT: 7 IU/L (ref 0–32)
AST: 14 IU/L (ref 0–40)
Albumin: 4.2 g/dL (ref 3.8–4.9)
Alkaline Phosphatase: 89 IU/L (ref 49–135)
BUN/Creatinine Ratio: 8 — ABNORMAL LOW (ref 9–23)
BUN: 8 mg/dL (ref 6–24)
Bilirubin Total: 0.6 mg/dL (ref 0.0–1.2)
CO2: 23 mmol/L (ref 20–29)
Calcium: 10.1 mg/dL (ref 8.7–10.2)
Chloride: 99 mmol/L (ref 96–106)
Creatinine, Ser: 0.99 mg/dL (ref 0.57–1.00)
Globulin, Total: 2.8 g/dL (ref 1.5–4.5)
Glucose: 86 mg/dL (ref 70–99)
Potassium: 4.4 mmol/L (ref 3.5–5.2)
Sodium: 138 mmol/L (ref 134–144)
Total Protein: 7 g/dL (ref 6.0–8.5)
eGFR: 69 mL/min/1.73 (ref >59)

## 2024-03-25 LAB — CBC WITH DIFFERENTIAL/PLATELET
Basophils Absolute: 0 x10E3/uL (ref 0.0–0.2)
Basos: 1 %
EOS (ABSOLUTE): 0.4 x10E3/uL (ref 0.0–0.4)
Eos: 6 %
Hematocrit: 33.4 % — ABNORMAL LOW (ref 34.0–46.6)
Hemoglobin: 10.6 g/dL — ABNORMAL LOW (ref 11.1–15.9)
Immature Grans (Abs): 0 x10E3/uL (ref 0.0–0.1)
Immature Granulocytes: 0 %
Lymphocytes Absolute: 1.6 x10E3/uL (ref 0.7–3.1)
Lymphs: 25 %
MCH: 28.5 pg (ref 26.6–33.0)
MCHC: 31.7 g/dL (ref 31.5–35.7)
MCV: 90 fL (ref 79–97)
Monocytes Absolute: 0.4 x10E3/uL (ref 0.1–0.9)
Monocytes: 6 %
Neutrophils Absolute: 4 x10E3/uL (ref 1.4–7.0)
Neutrophils: 62 %
Platelets: 362 x10E3/uL (ref 150–450)
RBC: 3.72 x10E6/uL — ABNORMAL LOW (ref 3.77–5.28)
RDW: 13.2 % (ref 11.7–15.4)
WBC: 6.4 x10E3/uL (ref 3.4–10.8)

## 2024-03-25 LAB — LIPID PANEL
Chol/HDL Ratio: 2.7 ratio (ref 0.0–4.4)
Cholesterol, Total: 160 mg/dL (ref 100–199)
HDL: 59 mg/dL (ref >39)
LDL Chol Calc (NIH): 82 mg/dL (ref 0–99)
Triglycerides: 105 mg/dL (ref 0–149)
VLDL Cholesterol Cal: 19 mg/dL (ref 5–40)

## 2024-03-25 LAB — VITAMIN B12: Vitamin B-12: 899 pg/mL (ref 232–1245)

## 2024-03-25 LAB — HEMOGLOBIN A1C
Est. average glucose Bld gHb Est-mCnc: 105 mg/dL
Hgb A1c MFr Bld: 5.3 % (ref 4.8–5.6)

## 2024-03-25 LAB — VITAMIN D 25 HYDROXY (VIT D DEFICIENCY, FRACTURES): Vit D, 25-Hydroxy: 39.2 ng/mL (ref 30.0–100.0)

## 2024-03-25 LAB — TSH: TSH: 3.79 u[IU]/mL (ref 0.450–4.500)

## 2024-03-26 ENCOUNTER — Other Ambulatory Visit: Payer: Self-pay | Admitting: Family

## 2024-03-26 DIAGNOSIS — R262 Difficulty in walking, not elsewhere classified: Secondary | ICD-10-CM | POA: Diagnosis not present

## 2024-03-26 DIAGNOSIS — M1712 Unilateral primary osteoarthritis, left knee: Secondary | ICD-10-CM | POA: Diagnosis not present

## 2024-03-26 DIAGNOSIS — F419 Anxiety disorder, unspecified: Secondary | ICD-10-CM

## 2024-03-26 MED ORDER — CLONAZEPAM 0.5 MG PO TABS: 0.5000 mg | ORAL_TABLET | Freq: Two times a day (BID) | ORAL | 0 refills | Status: DC | PRN

## 2024-03-29 NOTE — Progress Notes (Signed)
 Patient notified

## 2024-03-30 ENCOUNTER — Ambulatory Visit: Admitting: Physical Therapy

## 2024-03-31 DIAGNOSIS — M1712 Unilateral primary osteoarthritis, left knee: Secondary | ICD-10-CM | POA: Diagnosis not present

## 2024-03-31 DIAGNOSIS — R262 Difficulty in walking, not elsewhere classified: Secondary | ICD-10-CM | POA: Diagnosis not present

## 2024-04-02 ENCOUNTER — Other Ambulatory Visit: Payer: Self-pay | Admitting: Family

## 2024-04-02 DIAGNOSIS — R7303 Prediabetes: Secondary | ICD-10-CM

## 2024-04-05 DIAGNOSIS — M1712 Unilateral primary osteoarthritis, left knee: Secondary | ICD-10-CM | POA: Diagnosis not present

## 2024-04-05 DIAGNOSIS — R262 Difficulty in walking, not elsewhere classified: Secondary | ICD-10-CM | POA: Diagnosis not present

## 2024-04-06 ENCOUNTER — Other Ambulatory Visit: Payer: Self-pay | Admitting: Family

## 2024-04-08 DIAGNOSIS — R262 Difficulty in walking, not elsewhere classified: Secondary | ICD-10-CM | POA: Diagnosis not present

## 2024-04-08 DIAGNOSIS — M1712 Unilateral primary osteoarthritis, left knee: Secondary | ICD-10-CM | POA: Diagnosis not present

## 2024-04-12 DIAGNOSIS — R262 Difficulty in walking, not elsewhere classified: Secondary | ICD-10-CM | POA: Diagnosis not present

## 2024-04-12 DIAGNOSIS — M1712 Unilateral primary osteoarthritis, left knee: Secondary | ICD-10-CM | POA: Diagnosis not present

## 2024-04-14 DIAGNOSIS — M1712 Unilateral primary osteoarthritis, left knee: Secondary | ICD-10-CM | POA: Diagnosis not present

## 2024-04-14 DIAGNOSIS — R262 Difficulty in walking, not elsewhere classified: Secondary | ICD-10-CM | POA: Diagnosis not present

## 2024-04-21 DIAGNOSIS — M1712 Unilateral primary osteoarthritis, left knee: Secondary | ICD-10-CM | POA: Diagnosis not present

## 2024-04-21 DIAGNOSIS — R262 Difficulty in walking, not elsewhere classified: Secondary | ICD-10-CM | POA: Diagnosis not present

## 2024-04-26 ENCOUNTER — Ambulatory Visit: Admitting: Family

## 2024-04-27 DIAGNOSIS — M1712 Unilateral primary osteoarthritis, left knee: Secondary | ICD-10-CM | POA: Diagnosis not present

## 2024-04-27 DIAGNOSIS — R262 Difficulty in walking, not elsewhere classified: Secondary | ICD-10-CM | POA: Diagnosis not present

## 2024-04-30 DIAGNOSIS — M1712 Unilateral primary osteoarthritis, left knee: Secondary | ICD-10-CM | POA: Diagnosis not present

## 2024-04-30 DIAGNOSIS — R262 Difficulty in walking, not elsewhere classified: Secondary | ICD-10-CM | POA: Diagnosis not present

## 2024-05-04 ENCOUNTER — Inpatient Hospital Stay: Admission: RE | Admit: 2024-05-04 | Discharge: 2024-05-04 | Attending: Family | Admitting: Family

## 2024-05-04 DIAGNOSIS — Z1231 Encounter for screening mammogram for malignant neoplasm of breast: Secondary | ICD-10-CM

## 2024-05-11 ENCOUNTER — Encounter: Payer: Self-pay | Admitting: Family

## 2024-05-11 ENCOUNTER — Ambulatory Visit: Admitting: Family

## 2024-05-11 VITALS — BP 110/82 | HR 81 | Ht 64.0 in | Wt 212.6 lb

## 2024-05-11 DIAGNOSIS — R7303 Prediabetes: Secondary | ICD-10-CM

## 2024-05-11 DIAGNOSIS — Z0001 Encounter for general adult medical examination with abnormal findings: Secondary | ICD-10-CM

## 2024-05-11 DIAGNOSIS — Z0130 Encounter for examination of blood pressure without abnormal findings: Secondary | ICD-10-CM | POA: Diagnosis not present

## 2024-05-11 DIAGNOSIS — F419 Anxiety disorder, unspecified: Secondary | ICD-10-CM

## 2024-05-11 DIAGNOSIS — R5383 Other fatigue: Secondary | ICD-10-CM | POA: Diagnosis not present

## 2024-05-11 DIAGNOSIS — R799 Abnormal finding of blood chemistry, unspecified: Secondary | ICD-10-CM

## 2024-05-11 LAB — POC CREATINE & ALBUMIN,URINE
Albumin/Creatinine Ratio, Urine, POC: <30
Creatinine, POC: 300 mg/dL
Microalbumin Ur, POC: 30 mg/L

## 2024-05-11 MED ORDER — CLONAZEPAM 0.5 MG PO TABS: 0.5000 mg | ORAL_TABLET | Freq: Two times a day (BID) | ORAL | 5 refills | Status: AC | PRN

## 2024-05-11 NOTE — Progress Notes (Signed)
 "   Annual Wellness Visit  Patient: Candice Watkins, Female    DOB: 1971-01-29, 53 y.o.   MRN: [id number] Visit Date: 05/11/2024  Today's Provider: ALAN CHRISTELLA ARRANT, FNP  Subjective:    Chief Complaint  Patient presents with   Annual Exam    AWV   Candice Watkins is a 53 y.o. female who presents today for her Annual Wellness Visit.   Past Medical History:  Diagnosis Date   Anxiety    Arthritis    knees, ankles (10/11/2015)   Asthma    Depression    GERD (gastroesophageal reflux disease)    Heartburn    History of kidney stones    HLD (hyperlipidemia)    Hypertension    Hypothyroidism    Kidney stones    Migraine    monthly (10/11/2015)   PONV (postoperative nausea and vomiting) X 1   Pre-diabetes    Primary hyperparathyroidism    Seasonal allergies    Sleep apnea    No CPAP   Stomach ulcer    Past Surgical History:  Procedure Laterality Date   BREAST BIOPSY Right    neg- core   CESAREAN SECTION  05/20/1998   COLONOSCOPY WITH PROPOFOL  N/A 05/02/2022   Procedure: COLONOSCOPY WITH PROPOFOL ;  Surgeon: Unk Corinn Skiff, MD;  Location: [facility] ENDOSCOPY;  Service: Gastroenterology;  Laterality: N/A;   COLONOSCOPY WITH PROPOFOL  N/A 07/22/2022   Procedure: COLONOSCOPY WITH PROPOFOL ;  Surgeon: Unk Corinn Skiff, MD;  Location: [facility] ENDOSCOPY;  Service: Gastroenterology;  Laterality: N/A;   ESOPHAGOGASTRODUODENOSCOPY (EGD) WITH PROPOFOL  N/A 05/02/2022   Procedure: ESOPHAGOGASTRODUODENOSCOPY (EGD) WITH PROPOFOL ;  Surgeon: Unk Corinn Skiff, MD;  Location: [facility] ENDOSCOPY;  Service: Gastroenterology;  Laterality: N/A;   EXAM UNDER ANESTHESIA WITH MANIPULATION OF KNEE Right 01/2016   KNEE ARTHROSCOPY Right    THYROID  SURGERY     removed tumor   TOTAL HIP ARTHROPLASTY Left 09/23/2023   Procedure: ARTHROPLASTY, HIP, TOTAL, ANTERIOR APPROACH;  Surgeon: Beverley Evalene BIRCH, MD;  Location: [facility] ORS;  Service: Orthopedics;  Laterality: Left;   TOTAL KNEE  ARTHROPLASTY Right 10/11/2015   Procedure: TOTAL KNEE ARTHROPLASTY;  Surgeon: Toribio JULIANNA Beverley, MD;  Location: [facility] OR;  Service: Orthopedics;  Laterality: Right;   TOTAL KNEE ARTHROPLASTY Left 03/09/2024   Procedure: ARTHROPLASTY, KNEE, TOTAL;  Surgeon: Beverley Evalene BIRCH, MD;  Location: [facility] ORS;  Service: Orthopedics;  Laterality: Left;   TUBAL LIGATION     VAGINAL HYSTERECTOMY     Family History  Problem Relation Age of Onset   Breast cancer Paternal Aunt    Kidney disease Neg Hx    Social History   Socioeconomic History   Marital status: Single    Spouse name: Not on file   Number of children: Not on file   Years of education: Not on file   Highest education level: Not on file  Occupational History   Not on file  Tobacco Use   Smoking status: Never   Smokeless tobacco: Never  Vaping Use   Vaping status: Never Used  Substance and Sexual Activity   Alcohol use: Not Currently    Comment: occ   Drug use: No   Sexual activity: Yes    Birth control/protection: None    Comment: hysterectomy  Other Topics Concern   Not on file  Social History Narrative   Not on file   Social Drivers of Health   Tobacco Use: Low Risk (05/11/2024)   Patient History    Smoking Tobacco Use:  Never    Smokeless Tobacco Use: Never    Passive Exposure: Not on file  Financial Resource Strain: Not on file  Food Insecurity: No Food Insecurity (05/11/2024)   Epic    Worried About Programme Researcher, Broadcasting/film/video in the Last Year: Never true    Ran Out of Food in the Last Year: Never true  Transportation Needs: No Transportation Needs (05/11/2024)   Epic    Lack of Transportation (Medical): No    Lack of Transportation (Non-Medical): No  Physical Activity: Insufficiently Active (05/11/2024)   Exercise Vital Sign    Days of Exercise per Week: 3 days    Minutes of Exercise per Session: 20 min  Stress: Not on file  Social Connections: Not on file  Intimate Partner Violence: Not At Risk (05/11/2024)   Epic     Fear of Current or Ex-Partner: No    Emotionally Abused: No    Physically Abused: No    Sexually Abused: No  Depression (PHQ2-9): Medium Risk (05/11/2024)   Depression (PHQ2-9)    PHQ-2 Score: 6  Alcohol Screen: Not on file  Housing: Low Risk (05/11/2024)   Epic    Unable to Pay for Housing in the Last Year: No    Number of Times Moved in the Last Year: 0    Homeless in the Last Year: No  Utilities: Not At Risk (05/11/2024)   Epic    Threatened with loss of utilities: No  Health Literacy: Adequate Health Literacy (05/11/2024)   B1300 Health Literacy    Frequency of need for help with medical instructions: Never    Medications: Show/hide medication list[1]  Allergies[2]  Patient Care Team: Orlean Alan HERO, FNP as PCP - General (Family Medicine) Fernand Fredy RAMAN, MD as Referring Physician (Internal Medicine) Dellie Louanne MATSU, MD (General Surgery) Cindie Jesusa HERO, RN as Registered Nurse Dannielle Arlean FALCON, RN (Inactive) as Registered Nurse  Review of Systems    Objective:    Vitals: BP 110/82   Pulse 81   Ht 5' 4 (1.626 m)   Wt 212 lb 9.6 oz (96.4 kg)   SpO2 98%   BMI 36.49 kg/m    Physical Exam   Most recent functional status assessment:    05/11/2024    4:12 PM  In your present state of health, do you have any difficulty performing the following activities:  Hearing? 0  Vision? 0  Difficulty concentrating or making decisions? 0  Walking or climbing stairs? 0  Dressing or bathing? 0  Doing errands, shopping? 0    Most recent fall risk assessment:    05/10/2024   11:29 AM  Fall Risk   Falls in the past year? 1   Number falls in past yr: 1   Injury with Fall? 1      Manually entered by patient     Most recent depression screenings:    05/11/2024    4:11 PM 09/10/2022    4:08 PM  PHQ 2/9 Scores  PHQ - 2 Score 2 3  PHQ- 9 Score 6 13      Data saved with a previous flowsheet row definition    Most recent cognitive screening:     05/11/2024    4:13 PM  6CIT Screen  What Year? 0 points  What month? 0 points  What time? 0 points  Count back from 20 0 points  Months in reverse 0 points  Repeat phrase 0 points  Total Score 0 points  Results for orders placed or performed in visit on 05/11/24  POC CREATINE & ALBUMIN,URINE  Result Value Ref Range   Microalbumin Ur, POC 30 mg/L   Creatinine, POC 300 mg/dL   Albumin/Creatinine Ratio, Urine, POC <30        Assessment & Plan:      Annual wellness visit done today including the all of the following: Reviewed patient's Family Medical History Reviewed and updated list of patient's medical providers Assessment of cognitive impairment was done Assessed patient's functional ability Established a written schedule for health screening services Health Risk Assessent Completed and Reviewed  Exercise Activities and Dietary recommendations  Goals   None     Immunization History  Administered Date(s) Administered   Influenza, Mdck, Trivalent,PF 6+ MOS(egg free) 03/24/2024   Influenza, Seasonal, Injecte, Preservative Fre 04/06/2007, 02/24/2008, 03/31/2009   PPD Test 10/27/2007, 01/19/2008, 10/25/2008, 10/18/2009    Health Maintenance  Topic Date Due   Medicare Annual Wellness (AWV)  Never done   HIV Screening  Never done   Hepatitis C Screening  Never done   DTaP/Tdap/Td (1 - Tdap) Never done   Pneumococcal Vaccine: 50+ Years (1 of 2 - PCV) Never done   Hepatitis B Vaccines 19-59 Average Risk (1 of 3 - 19+ 3-dose series) Never done   Zoster Vaccines- Shingrix (1 of 2) Never done   COVID-19 Vaccine (1 - 2025-26 season) Never done   Diabetic kidney evaluation - eGFR measurement  03/24/2025   Diabetic kidney evaluation - Urine ACR  05/11/2025   Mammogram  05/04/2026   Cervical Cancer Screening (HPV/Pap Cotest)  06/17/2028   Colonoscopy  07/21/2029   Influenza Vaccine  Completed   HPV VACCINES  Aged Out   Meningococcal B Vaccine  Aged Out     Discussed  health benefits of physical activity, and encouraged her to engage in regular exercise appropriate for her age and condition.    Assessment & Plan Anxiety Patient stable.  Well controlled with current therapy.   Continue current meds.   Abnormal blood chemistry Other fatigue Rechecking CBC and adding TIBC, Ferritin and Iron Sat.  Will call with results.   Prediabetes A1C Continues to be in prediabetic ranges.  Will reassess at follow up after next lab check.  Patient counseled on dietary choices and verbalized understanding.   -CBC w/Diff -CMP w/eGFR -Hemoglobin A1C        ALAN CHRISTELLA ARRANT, FNP   05/11/2024  This document may have been prepared by [facility] Voice Recognition software and as such may include unintentional dictation errors.      [1]  Outpatient Medications Prior to Visit  Medication Sig   acetaminophen  (TYLENOL ) 500 MG tablet Take 2 tablets (1,000 mg total) by mouth every 6 (six) hours as needed for mild pain (pain score 1-3) or moderate pain (pain score 4-6).   amoxicillin  (AMOXIL ) 500 MG capsule Take 2,000 mg by mouth See admin instructions. Take 4 capsules (2000 mg) by mouth 1 hour prior to dental appointments.   aspirin  EC 81 MG tablet Take 1 tablet (81 mg total) by mouth 2 (two) times daily. To prevent blood clots for 30 days after surgery.   Azelastine HCl 137 MCG/SPRAY SOLN USE 1 SPRAY INTO EACH NOSTRIL TWICE A DAY (Patient taking differently: Place 1 spray into both nostrils 2 (two) times daily as needed (allergies).)   budesonide-formoterol (SYMBICORT) 80-4.5 MCG/ACT inhaler TAKE 2 PUFFS BY MOUTH TWICE A DAY   cetirizine (ZYRTEC) 10 MG tablet Take 10 mg  by mouth daily in the afternoon.   Collagen-Vitamin C-Biotin (COLLAGEN PO) Take 2 each by mouth in the morning. W/biotin (Gummiest)   levothyroxine  (SYNTHROID ) 112 MCG tablet TAKE 1 TABLET BY MOUTH EVERY MORNING AT LEAST 30 MINUTES BEFORE FOOD OR OTHER MEDS.   methocarbamol  (ROBAXIN ) 750 MG tablet  Take 1 tablet (750 mg total) by mouth every 8 (eight) hours as needed for muscle spasms.   metoprolol  succinate (TOPROL -XL) 50 MG 24 hr tablet TAKE 1 TABLET BY MOUTH EVERY DAY (Patient taking differently: Take 50 mg by mouth at bedtime.)   Multiple Vitamin (MULTIVITAMIN WITH MINERALS) TABS tablet Take 1 tablet by mouth daily after breakfast.   ondansetron  (ZOFRAN -ODT) 4 MG disintegrating tablet Take 1 tablet (4 mg total) by mouth every 8 (eight) hours as needed for nausea or vomiting.   OVER THE COUNTER MEDICATION Take 2 capsules by mouth daily after breakfast. Sea Moss Black Seed Oil Ashwagandha Ginger Capsules   oxyCODONE  (ROXICODONE ) 5 MG immediate release tablet Take 1 tablet (5 mg total) by mouth every 4 (four) hours as needed for severe pain (pain score 7-10). in knee after surgery   rosuvastatin  (CRESTOR ) 10 MG tablet TAKE 1 TABLET BY MOUTH EVERY DAY (Patient taking differently: Take 10 mg by mouth at bedtime.)   sennosides-docusate sodium  (SENOKOT-S) 8.6-50 MG tablet Take 2 tablets by mouth daily as needed for constipation (while taking narcotics).   tirzepatide  (MOUNJARO ) 10 MG/0.5ML Pen INJECT 10 MG INTO THE SKIN ONE TIME PER WEEK   traZODone  (DESYREL ) 50 MG tablet Take 1 tablet (50 mg total) by mouth at bedtime as needed for sleep.   TRINTELLIX  20 MG TABS tablet Take 20 mg by mouth in the morning.   valsartan -hydrochlorothiazide  (DIOVAN -HCT) 160-12.5 MG tablet TAKE 1 TABLET BY MOUTH EVERY DAY   [DISCONTINUED] clonazePAM  (KLONOPIN ) 0.5 MG tablet Take 1 tablet (0.5 mg total) by mouth 2 (two) times daily as needed for anxiety.   meloxicam  (MOBIC ) 15 MG tablet Take 1 tablet (15 mg total) by mouth daily. For 2 weeks for pain and inflammation. Then take as needed   No facility-administered medications prior to visit.  [2]  Allergies Allergen Reactions   Ibuprofen  Other (See Comments)    History of Stomach Ulcers   Metronidazole Swelling and Other (See Comments)    EDEMA    Phenyltoloxamine-Acetaminophen  Hives   Other Nausea And Vomiting    Cogesic   Septra [Sulfamethoxazole-Trimethoprim] Nausea And Vomiting   "

## 2024-05-11 NOTE — Assessment & Plan Note (Signed)
 Patient stable.  Well controlled with current therapy.   Continue current meds.

## 2024-05-11 NOTE — Assessment & Plan Note (Addendum)
.  A1C Continues to be in prediabetic ranges.  Will reassess at follow up after next lab check.  Patient counseled on dietary choices and verbalized understanding.   -CBC w/Diff -CMP w/eGFR -Hemoglobin A1C

## 2024-05-12 ENCOUNTER — Encounter: Payer: Self-pay | Admitting: Family

## 2024-05-12 ENCOUNTER — Ambulatory Visit: Payer: Self-pay

## 2024-05-12 LAB — IRON,TIBC AND FERRITIN PANEL
Ferritin: 96 ng/mL (ref 15–150)
Iron Saturation: 16 % (ref 15–55)
Iron: 47 ug/dL (ref 27–159)
Total Iron Binding Capacity: 302 ug/dL (ref 250–450)
UIBC: 255 ug/dL (ref 131–425)

## 2024-05-12 LAB — CBC WITH DIFFERENTIAL/PLATELET
Basophils Absolute: 0.1 x10E3/uL (ref 0.0–0.2)
Basos: 1 %
EOS (ABSOLUTE): 0.3 x10E3/uL (ref 0.0–0.4)
Eos: 4 %
Hematocrit: 35.8 % (ref 34.0–46.6)
Hemoglobin: 11.4 g/dL (ref 11.1–15.9)
Immature Grans (Abs): 0 x10E3/uL (ref 0.0–0.1)
Immature Granulocytes: 0 %
Lymphocytes Absolute: 1.8 x10E3/uL (ref 0.7–3.1)
Lymphs: 30 %
MCH: 28.9 pg (ref 26.6–33.0)
MCHC: 31.8 g/dL (ref 31.5–35.7)
MCV: 91 fL (ref 79–97)
Monocytes Absolute: 0.4 x10E3/uL (ref 0.1–0.9)
Monocytes: 7 %
Neutrophils Absolute: 3.5 x10E3/uL (ref 1.4–7.0)
Neutrophils: 58 %
Platelets: 314 x10E3/uL (ref 150–450)
RBC: 3.94 x10E6/uL (ref 3.77–5.28)
RDW: 13.2 % (ref 11.7–15.4)
WBC: 6.1 x10E3/uL (ref 3.4–10.8)

## 2024-05-17 NOTE — Telephone Encounter (Signed)
 Patient called and informed that I had sent a separate message about her labs with more detail but since she didn't understand or see that message I called and spoke with her and assured her everything was good

## 2024-05-25 ENCOUNTER — Other Ambulatory Visit: Payer: Self-pay | Admitting: Family

## 2024-05-27 ENCOUNTER — Other Ambulatory Visit: Payer: Self-pay | Admitting: Family

## 2024-06-11 ENCOUNTER — Telehealth: Payer: Self-pay | Admitting: Family

## 2024-06-11 NOTE — Telephone Encounter (Signed)
 Patient called in stating she needs a prior auth on her Mounjaro . Advised patient that I will get it done next week. Patient verbalized understanding.

## 2024-06-22 ENCOUNTER — Telehealth: Payer: Self-pay | Admitting: Family

## 2024-06-23 NOTE — Telephone Encounter (Signed)
 Patient left VM wanting an update on her Mounjaro .   The PA was denied because she is not diabetic. We need to call patient and let her know.

## 2024-06-25 ENCOUNTER — Other Ambulatory Visit: Payer: Self-pay

## 2024-06-25 DIAGNOSIS — R7303 Prediabetes: Secondary | ICD-10-CM

## 2024-06-25 MED ORDER — MOUNJARO 10 MG/0.5ML ~~LOC~~ SOAJ: 10.0000 mg | SUBCUTANEOUS | 3 refills | Status: AC

## 2024-06-25 NOTE — Progress Notes (Signed)
 Candice Watkins                                          MRN: [id number]   06/25/2024   The [facility] Quality Team Specialist reviewed this patient medical record for the purposes of chart review for care gap closure. The following were reviewed: abstraction for care gap closure-kidney health evaluation for diabetes:eGFR  and uACR.    [facility] Quality Team

## 2024-07-06 ENCOUNTER — Ambulatory Visit: Admitting: Family

## 2024-08-10 ENCOUNTER — Ambulatory Visit: Admitting: Family
